# Patient Record
Sex: Female | Born: 1935 | Race: Black or African American | Hispanic: No | Smoking: Never smoker
Health system: Southern US, Community
[De-identification: ages and names within clinical notes are randomized; demographics above are authoritative.]

## PROBLEM LIST (undated history)

## (undated) DIAGNOSIS — R109 Unspecified abdominal pain: Secondary | ICD-10-CM

## (undated) DIAGNOSIS — F32A Depression, unspecified: Secondary | ICD-10-CM

## (undated) DIAGNOSIS — H9319 Tinnitus, unspecified ear: Secondary | ICD-10-CM

## (undated) DIAGNOSIS — R630 Anorexia: Secondary | ICD-10-CM

## (undated) DIAGNOSIS — G253 Myoclonus: Secondary | ICD-10-CM

## (undated) DIAGNOSIS — R768 Other specified abnormal immunological findings in serum: Secondary | ICD-10-CM

## (undated) DIAGNOSIS — F329 Major depressive disorder, single episode, unspecified: Secondary | ICD-10-CM

## (undated) DIAGNOSIS — C801 Malignant (primary) neoplasm, unspecified: Secondary | ICD-10-CM

## (undated) DIAGNOSIS — I6529 Occlusion and stenosis of unspecified carotid artery: Secondary | ICD-10-CM

## (undated) DIAGNOSIS — Z9189 Other specified personal risk factors, not elsewhere classified: Secondary | ICD-10-CM

## (undated) DIAGNOSIS — M797 Fibromyalgia: Secondary | ICD-10-CM

## (undated) DIAGNOSIS — R194 Change in bowel habit: Secondary | ICD-10-CM

## (undated) DIAGNOSIS — IMO0001 Reserved for inherently not codable concepts without codable children: Secondary | ICD-10-CM

## (undated) DIAGNOSIS — R55 Syncope and collapse: Secondary | ICD-10-CM

## (undated) DIAGNOSIS — I82401 Acute embolism and thrombosis of unspecified deep veins of right lower extremity: Secondary | ICD-10-CM

## (undated) DIAGNOSIS — N842 Polyp of vagina: Secondary | ICD-10-CM

## (undated) DIAGNOSIS — M1990 Unspecified osteoarthritis, unspecified site: Secondary | ICD-10-CM

## (undated) DIAGNOSIS — L409 Psoriasis, unspecified: Secondary | ICD-10-CM

## (undated) DIAGNOSIS — K5140 Inflammatory polyps of colon without complications: Secondary | ICD-10-CM

## (undated) DIAGNOSIS — K635 Polyp of colon: Secondary | ICD-10-CM

## (undated) DIAGNOSIS — N390 Urinary tract infection, site not specified: Secondary | ICD-10-CM

## (undated) DIAGNOSIS — I519 Heart disease, unspecified: Secondary | ICD-10-CM

## (undated) DIAGNOSIS — R3915 Urgency of urination: Secondary | ICD-10-CM

## (undated) DIAGNOSIS — E785 Hyperlipidemia, unspecified: Secondary | ICD-10-CM

## (undated) DIAGNOSIS — E559 Vitamin D deficiency, unspecified: Secondary | ICD-10-CM

## (undated) DIAGNOSIS — K649 Unspecified hemorrhoids: Secondary | ICD-10-CM

## (undated) DIAGNOSIS — I700 Atherosclerosis of aorta: Secondary | ICD-10-CM

## (undated) DIAGNOSIS — K5909 Other constipation: Secondary | ICD-10-CM

## (undated) HISTORY — PX: ENDOMETRIAL BIOPSY: SHX622

## (undated) HISTORY — DX: Other specified personal risk factors, not elsewhere classified: Z91.89

## (undated) HISTORY — DX: Myoclonus: G25.3

## (undated) HISTORY — DX: Heart disease, unspecified: I51.9

## (undated) HISTORY — DX: Other specified abnormal immunological findings in serum: R76.8

## (undated) HISTORY — DX: Unspecified hemorrhoids: K64.9

## (undated) HISTORY — DX: Tinnitus, unspecified ear: H93.19

## (undated) HISTORY — PX: TUBAL LIGATION: SHX77

## (undated) HISTORY — DX: Hyperlipidemia, unspecified: E78.5

## (undated) HISTORY — DX: Acute embolism and thrombosis of unspecified deep veins of right lower extremity: I82.401

## (undated) HISTORY — DX: Vitamin D deficiency, unspecified: E55.9

## (undated) HISTORY — DX: Occlusion and stenosis of unspecified carotid artery: I65.29

## (undated) HISTORY — PX: ECTOPIC PREGNANCY SURGERY: SHX613

## (undated) HISTORY — PX: BREAST SURGERY: SHX581

## (undated) HISTORY — PX: TONSILLECTOMY: SUR1361

## (undated) HISTORY — DX: Psoriasis, unspecified: L40.9

## (undated) HISTORY — PX: OTHER SURGICAL HISTORY: SHX169

## (undated) HISTORY — DX: Syncope and collapse: R55

## (undated) HISTORY — DX: Urinary tract infection, site not specified: N39.0

## (undated) HISTORY — DX: Polyp of vagina: N84.2

## (undated) HISTORY — DX: Atherosclerosis of aorta: I70.0

## (undated) HISTORY — DX: Other constipation: K59.09

## (undated) HISTORY — DX: Polyp of colon: K63.5

## (undated) HISTORY — DX: Inflammatory polyps of colon without complications: K51.40

---

## 2010-11-26 DIAGNOSIS — I519 Heart disease, unspecified: Secondary | ICD-10-CM

## 2010-11-26 HISTORY — DX: Heart disease, unspecified: I51.9

## 2015-03-21 ENCOUNTER — Encounter: Payer: Self-pay | Admitting: Internal Medicine

## 2015-03-21 ENCOUNTER — Ambulatory Visit (INDEPENDENT_AMBULATORY_CARE_PROVIDER_SITE_OTHER): Payer: Medicare Other | Admitting: Internal Medicine

## 2015-03-21 VITALS — BP 158/90 | HR 64 | Temp 98.1°F | Resp 14 | Ht 66.75 in | Wt 127.5 lb

## 2015-03-21 DIAGNOSIS — F09 Unspecified mental disorder due to known physiological condition: Secondary | ICD-10-CM

## 2015-03-21 DIAGNOSIS — R4189 Other symptoms and signs involving cognitive functions and awareness: Secondary | ICD-10-CM | POA: Diagnosis not present

## 2015-03-21 DIAGNOSIS — I10 Essential (primary) hypertension: Secondary | ICD-10-CM

## 2015-03-21 DIAGNOSIS — I25810 Atherosclerosis of coronary artery bypass graft(s) without angina pectoris: Secondary | ICD-10-CM

## 2015-03-21 DIAGNOSIS — M064 Inflammatory polyarthropathy: Secondary | ICD-10-CM

## 2015-03-21 DIAGNOSIS — M130 Polyarthritis, unspecified: Secondary | ICD-10-CM

## 2015-03-21 DIAGNOSIS — E559 Vitamin D deficiency, unspecified: Secondary | ICD-10-CM | POA: Diagnosis not present

## 2015-03-21 DIAGNOSIS — I25119 Atherosclerotic heart disease of native coronary artery with unspecified angina pectoris: Secondary | ICD-10-CM

## 2015-03-21 DIAGNOSIS — R030 Elevated blood-pressure reading, without diagnosis of hypertension: Secondary | ICD-10-CM

## 2015-03-21 DIAGNOSIS — F0789 Other personality and behavioral disorders due to known physiological condition: Secondary | ICD-10-CM

## 2015-03-21 LAB — LIPID PANEL
CHOL/HDL RATIO: 4
Cholesterol: 236 mg/dL — ABNORMAL HIGH (ref 0–200)
HDL: 66.7 mg/dL (ref >39.00)
LDL Cholesterol: 150 mg/dL — ABNORMAL HIGH (ref 0–99)
NonHDL: 169.3
Triglycerides: 95 mg/dL (ref 0.0–149.0)
VLDL: 19 mg/dL (ref 0.0–40.0)

## 2015-03-21 LAB — CBC WITH DIFFERENTIAL/PLATELET
BASOS PCT: 0.6 % (ref 0.0–3.0)
Basophils Absolute: 0 10*3/uL (ref 0.0–0.1)
Eosinophils Absolute: 0.1 10*3/uL (ref 0.0–0.7)
Eosinophils Relative: 1.4 % (ref 0.0–5.0)
HEMATOCRIT: 40.5 % (ref 36.0–46.0)
Hemoglobin: 13.5 g/dL (ref 12.0–15.0)
Lymphocytes Relative: 19.4 % (ref 12.0–46.0)
Lymphs Abs: 1.4 10*3/uL (ref 0.7–4.0)
MCHC: 33.4 g/dL (ref 30.0–36.0)
MCV: 87.3 fl (ref 78.0–100.0)
Monocytes Absolute: 0.4 10*3/uL (ref 0.1–1.0)
Monocytes Relative: 5.8 % (ref 3.0–12.0)
Neutro Abs: 5.2 10*3/uL (ref 1.4–7.7)
Neutrophils Relative %: 72.8 % (ref 43.0–77.0)
Platelets: 289 10*3/uL (ref 150.0–400.0)
RBC: 4.64 Mil/uL (ref 3.87–5.11)
RDW: 13.7 % (ref 11.5–15.5)
WBC: 7.1 10*3/uL (ref 4.0–10.5)

## 2015-03-21 LAB — COMPREHENSIVE METABOLIC PANEL
ALBUMIN: 4.1 g/dL (ref 3.5–5.2)
ALT: 22 U/L (ref 0–35)
AST: 22 U/L (ref 0–37)
Alkaline Phosphatase: 94 U/L (ref 39–117)
BILIRUBIN TOTAL: 0.9 mg/dL (ref 0.2–1.2)
BUN: 12 mg/dL (ref 6–23)
CO2: 26 mEq/L (ref 19–32)
Calcium: 9.3 mg/dL (ref 8.4–10.5)
Chloride: 105 mEq/L (ref 96–112)
Creatinine, Ser: 0.65 mg/dL (ref 0.40–1.20)
GFR: 110.59 mL/min (ref >60.00)
GLUCOSE: 83 mg/dL (ref 70–99)
POTASSIUM: 4 meq/L (ref 3.5–5.1)
SODIUM: 139 meq/L (ref 135–145)
Total Protein: 6.7 g/dL (ref 6.0–8.3)

## 2015-03-21 LAB — IRON AND TIBC
%SAT: 28 % (ref 20–55)
Iron: 89 ug/dL (ref 42–145)
TIBC: 314 ug/dL (ref 250–470)
UIBC: 225 ug/dL (ref 125–400)

## 2015-03-21 LAB — MICROALBUMIN / CREATININE URINE RATIO
CREATININE, U: 105.9 mg/dL
Microalb Creat Ratio: 0.9 mg/g (ref 0.0–30.0)
Microalb, Ur: 0.9 mg/dL (ref 0.0–1.9)

## 2015-03-21 LAB — C-REACTIVE PROTEIN

## 2015-03-21 LAB — SEDIMENTATION RATE: Sed Rate: 28 mm/hr — ABNORMAL HIGH (ref 0–22)

## 2015-03-21 LAB — VITAMIN B12: Vitamin B-12: 1157 pg/mL — ABNORMAL HIGH (ref 211–911)

## 2015-03-21 LAB — RHEUMATOID FACTOR: Rhuematoid fact SerPl-aCnc: 16 IU/mL — ABNORMAL HIGH (ref <=14)

## 2015-03-21 LAB — VITAMIN D 25 HYDROXY (VIT D DEFICIENCY, FRACTURES): VITD: 24.13 ng/mL — ABNORMAL LOW (ref 30.00–100.00)

## 2015-03-21 LAB — CK: Total CK: 91 U/L (ref 7–177)

## 2015-03-21 LAB — FERRITIN: Ferritin: 35.3 ng/mL (ref 10.0–291.0)

## 2015-03-21 NOTE — Progress Notes (Signed)
Pre-visit discussion using our clinic review tool. No additional management support is needed unless otherwise documented below in the visit note.  

## 2015-03-21 NOTE — Progress Notes (Addendum)
Patient ID: Carmen Humphrey, female   DOB: 1925-08-25, 81 y.o.   MRN: [id number]   Patient Active Problem List   Diagnosis Date Noted  . Blood pressure elevated without history of HTN 03/22/2015  . Polyarthritis of multiple sites 03/22/2015  . Cognitive and neurobehavioral dysfunction 03/22/2015  . Coronary atherosclerosis of native coronary artery 03/22/2015    Subjective:  CC:   Chief Complaint  Patient presents with  . Establish Care    Eyes ill both become blood shot looking feels like blood coming out of eyes.  . Tinnitus    with headache, feels dizzy when this happens.  . Fibromyalgia    HPI:   Carmen Lesteris a 81 y.o. female who presents  Multiple pain and somatic complaints.  She is a new patient,  Referred by her daughter, with whom she has come to live as recently as last week after relocating  from [place].  Her daughter is quite overwhelmed by her presence and notes that her cognitive function appears to have declined since the last time she saw her. Suffers from anxiety and depression.  Not treated,  She is not sleeping well.   Dozes for  two hours at most  .  Awake most of the time during the day.  Reason for move to [place] to be with daughter due to increased difficulty getting to see the doctors. Having trouble coping with details .  1) Last week her eyes were "bloody."   Does not know what the cause is , did not affect her vision .  There was no bloody discharge.  Symptoms have resolved  2) I am im constant pain all the time. From the neck down to my toes.  Every part of my body hurts. I  have fibromyalgia , self diagnosed over 10 years ago, confirmed by my doctors,  Not sure if she is seeing a rheumatologist.  Not taking any prescribed medcatiosn.  Patient is noted to jerk every minute or so as if in great pain.  hand pain worse across the pads of right hand for the past several weeks.     3) Constant Ringing in the ears and dizziness.  No prior workup per patient.   walks without assistance,  Uses a cane as a last resort,  No falls in over a year.  One remote fall resulted in a  Gash on forehead,  Occurred while walking to market.  Was taken to  ER in [place].  Right hand pain is getting worse  4)   Wants to see a cardiologist .  Cannot disclose any cardiac history.  Not on any medications except OTC supplements. Probably CAD with angina. Medically managed. reports that she was given Ranexa but did  Not tolerate it due to  Having " every symptom that was listed".  Has chest pain "not too often".  No history of cardiac cath or CABG ever done   5) Last week had an episode of  itching inside her left ribcage,  "felt like it was on the inside wanting to come out",  No rash   6) possible weight loss.  Patient cannot quantify but complains that "I have lost all this weight,  Just skin and bones"   PMH:  Unknown.  Patient cannot/will not disclose any history.   Hypertension.  Not taking anything   Meds:  Takes Omega CoQ 10, metamucil,  And a probiotic for chronic constipation..  Moves bowels daily i"but it's not  Cleaning me  out".  Last colonoscopy was 4 years ago fpr follow up on polyps.        Past Medical History  Diagnosis Date  . Heart disease 2012  . History of fainting spells of unknown cause   . Inflammatory polyps of colon     . Allergies  Allergen Reactions  . Ciprofloxacin Nausea And Vomiting    Past Surgical History  Procedure Laterality Date  . Breast surgery      History   Social History  . Marital Status: Widowed    Spouse Name: N/A  . Number of Children: N/A  . Years of Education: N/A   Occupational History  . Not on file.   Social History Main Topics  . Smoking status: Never Smoker   . Smokeless tobacco: Never Used  . Alcohol Use: No  . Drug Use: No  . Sexual Activity: Not on file   Other Topics Concern  . Not on file   Social History Narrative    Family History  Problem Relation Age of Onset  . Heart  disease Mother   . Hypertension Mother   . Heart disease Father   . Hypertension Father          Review of Systems:   The rest of the review of systems was negative except those addressed in the HPI.      Objective:  BP 158/90 mmHg  Pulse 64  Temp(Src) 98.1 F (36.7 C) (Oral)  Resp 14  Ht 5' 6.75" (1.695 m)  Wt 127 lb 8 oz (57.834 kg)  BMI 20.13 kg/m2  SpO2 99%  General appearance: alert, cooperative and appears stated age Ears: normal TM's and external ear canals both ears Throat: lips, mucosa, and tongue normal; teeth and gums normal Neck: no adenopathy, no carotid bruit, supple, symmetrical, trachea midline and thyroid not enlarged, symmetric, no tenderness/mass/nodules Back: symmetric, no curvature. ROM normal. No CVA tenderness. Lungs: clear to auscultation bilaterally Heart: regular rate and rhythm, S1, S2 normal, no murmur, click, rub or gallop Abdomen: soft, non-tender; bowel sounds normal; no masses,  no organomegaly Pulses: 2+ and symmetric Skin: Skin color, texture, turgor normal. No rashes or lesions Lymph nodes: Cervical, supraclavicular, and axillary nodes normal.  Assessment and Plan:  Blood pressure elevated without history of HTN Will treat if elevated at next visit,  Awaiting records,  Has unclear history.  Renal funciton,  Lytes, and UA are normal today.  Lab Results  Component Value Date   CREATININE 0.65 03/21/2015   Lab Results  Component Value Date   NA 139 03/21/2015   K 4.0 03/21/2015   CL 105 03/21/2015   CO2 26 03/21/2015   Lab Results  Component Value Date   MICROALBUR 0.9 03/21/2015     Polyarthritis of multiple sites Prior Workup unclear.  Right hand was painful to painful to palpation but no synovitis was present . Autoimmune panel is mostly pending, including HLA B27, iron studies, etc.  Referral to Dr Jefm Bryant for his opinion recommended if trial of NSAID is not tolerated.  Lab Results  Component Value Date    ESRSEDRATE 28* 03/21/2015   Lab Results  Component Value Date   CRP <0.1* 03/21/2015   Lab Results  Component Value Date   RF 16* 03/21/2015     Cognitive and neurobehavioral dysfunction Unclear if she has pseudodementia from  untreated depression, or vascular dementia.  Thyroid, RPR,  and B12 levels are normal.  Will need MRI brain and neurology evaluation  at some point, pending review of recent records.    Lab Results  Component Value Date   TSH 4.030 03/21/2015   Lab Results  Component Value Date   [id number] 3419* 03/21/2015      Coronary atherosclerosis of native coronary artery Presumed given past trial of ranexa per patient,  Not tolerated,  Referral to [facility] requested,  Will obtain labs and prior records from [facility] cardiologist first.  Lab Results  Component Value Date   CHOL 236* 03/21/2015   HDL 66.70 03/21/2015   LDLCALC 150* 03/21/2015   TRIG 95.0 03/21/2015   CHOLHDL 4 03/21/2015     A total of 45 minutes was spent with patient more than half of which was spent in counseling patient on the above mentioned issues , reviewing and explaining recent labs and imaging studies done, and coordination of care. Updated Medication List Outpatient Encounter Prescriptions as of 03/21/2015  Medication Sig  . Bisacodyl (LAXATIVE PO) Take 1 capsule by mouth daily. ADVANCE COLON CARE II  . Coenzyme Q10-Fish Oil-Vit E (CO-Q 10 OMEGA-3 FISH OIL) CAPS Take 2 capsules by mouth daily.  . Probiotic Product (PROBIOTIC & ACIDOPHILUS EX ST) CAPS Take 1 capsule by mouth daily as needed.  . etodolac (LODINE XL) 400 MG 24 hr tablet Take 1 tablet (400 mg total) by mouth daily.     Orders Placed This Encounter  Procedures  . Microalbumin / creatinine urine ratio  . Comprehensive metabolic panel  . CBC with Differential/Platelet  . Lipid panel  . Vit D  25 hydroxy (rtn osteoporosis monitoring)  . Sedimentation rate  . HLA-B27 antigen  . C-reactive protein  . Ferritin  .  Iron and TIBC  . Rheumatoid factor  . CK  . RPR  . T4 AND TSH  . B12    Return in about 4 weeks (around 04/18/2015).

## 2015-03-21 NOTE — Patient Instructions (Signed)
I will make a referral to a cardiologist and a rheumatologist   I have ordered fasting labs   You have refused anything to help you rest.   You have refused the pneumonia vaccine

## 2015-03-22 ENCOUNTER — Encounter: Payer: Self-pay | Admitting: Internal Medicine

## 2015-03-22 DIAGNOSIS — F09 Unspecified mental disorder due to known physiological condition: Secondary | ICD-10-CM

## 2015-03-22 DIAGNOSIS — M19042 Primary osteoarthritis, left hand: Secondary | ICD-10-CM | POA: Insufficient documentation

## 2015-03-22 DIAGNOSIS — I2510 Atherosclerotic heart disease of native coronary artery without angina pectoris: Secondary | ICD-10-CM | POA: Insufficient documentation

## 2015-03-22 DIAGNOSIS — E559 Vitamin D deficiency, unspecified: Secondary | ICD-10-CM | POA: Insufficient documentation

## 2015-03-22 DIAGNOSIS — R030 Elevated blood-pressure reading, without diagnosis of hypertension: Secondary | ICD-10-CM

## 2015-03-22 DIAGNOSIS — F0789 Other personality and behavioral disorders due to known physiological condition: Secondary | ICD-10-CM | POA: Insufficient documentation

## 2015-03-22 DIAGNOSIS — M1990 Unspecified osteoarthritis, unspecified site: Secondary | ICD-10-CM | POA: Insufficient documentation

## 2015-03-22 HISTORY — DX: Unspecified mental disorder due to known physiological condition: F09

## 2015-03-22 HISTORY — DX: Atherosclerotic heart disease of native coronary artery without angina pectoris: I25.10

## 2015-03-22 HISTORY — DX: Elevated blood-pressure reading, without diagnosis of hypertension: R03.0

## 2015-03-22 HISTORY — DX: Primary osteoarthritis, left hand: M19.042

## 2015-03-22 LAB — RPR

## 2015-03-22 LAB — T4 AND TSH
T4 TOTAL: 8.2 ug/dL (ref 4.5–12.0)
TSH: 4.03 u[IU]/mL (ref 0.450–4.500)

## 2015-03-22 MED ORDER — ERGOCALCIFEROL 1.25 MG (50000 UT) PO CAPS: 50000.0000 [IU] | ORAL_CAPSULE | ORAL | Status: DC

## 2015-03-22 MED ORDER — ETODOLAC ER 400 MG PO TB24: 400.0000 mg | ORAL_TABLET | Freq: Every day | ORAL | Status: DC

## 2015-03-22 NOTE — Assessment & Plan Note (Signed)
Presumed given past trial of ranexa per patient,  Not tolerated,  Referral to [facility] requested,  Will obtain labs and prior records from [facility] cardiologist first.  Lab Results  Component Value Date   CHOL 236* 03/21/2015   HDL 66.70 03/21/2015   LDLCALC 150* 03/21/2015   TRIG 95.0 03/21/2015   CHOLHDL 4 03/21/2015

## 2015-03-22 NOTE — Assessment & Plan Note (Addendum)
Unclear if she has pseudodementia from  untreated depression, or vascular dementia.  Thyroid, RPR,  and B12 levels are normal.  Will need MRI brain and neurology evaluation  at some point, pending review of recent records.    Lab Results  Component Value Date   TSH 4.030 03/21/2015   Lab Results  Component Value Date   [id number] 0722* 03/21/2015

## 2015-03-22 NOTE — Addendum Note (Signed)
Addended by: Crecencio Mc on: 03/22/2015 07:49 PM   Modules accepted: Orders

## 2015-03-22 NOTE — Assessment & Plan Note (Signed)
Will treat if elevated at next visit,  Awaiting records,  Has unclear history.  Renal funciton,  Lytes, and UA are normal today.  Lab Results  Component Value Date   CREATININE 0.65 03/21/2015   Lab Results  Component Value Date   NA 139 03/21/2015   K 4.0 03/21/2015   CL 105 03/21/2015   CO2 26 03/21/2015   Lab Results  Component Value Date   MICROALBUR 0.9 03/21/2015

## 2015-03-22 NOTE — Assessment & Plan Note (Addendum)
Prior Workup unclear.  Right hand was painful to painful to palpation but no synovitis was present . Autoimmune panel is mostly pending, including HLA B27, iron studies, etc.  Referral to Dr Jefm Bryant for his opinion recommended if trial of NSAID is not tolerated.  Lab Results  Component Value Date   ESRSEDRATE 28* 03/21/2015   Lab Results  Component Value Date   CRP <0.1* 03/21/2015   Lab Results  Component Value Date   RF 16* 03/21/2015

## 2015-03-23 ENCOUNTER — Telehealth: Payer: Self-pay

## 2015-03-23 ENCOUNTER — Encounter: Payer: Self-pay | Admitting: *Deleted

## 2015-03-23 LAB — HLA-B27 ANTIGEN: DNA Result:: NOT DETECTED

## 2015-03-23 NOTE — Telephone Encounter (Signed)
Please advise 

## 2015-03-23 NOTE — Telephone Encounter (Signed)
The patient is hoping to get a referral to a cardiologist.  She stated she had a cardiologist in [place], but is hoping to establish with one here in town.

## 2015-03-23 NOTE — Telephone Encounter (Signed)
Patient notified and voiced understanding.

## 2015-03-23 NOTE — Telephone Encounter (Signed)
I am waiting for her records for mher prior cardiologist, since she cannot provide any detail of her history

## 2015-03-25 DIAGNOSIS — R079 Chest pain, unspecified: Secondary | ICD-10-CM

## 2015-03-25 DIAGNOSIS — R0602 Shortness of breath: Secondary | ICD-10-CM | POA: Insufficient documentation

## 2015-03-25 HISTORY — DX: Chest pain, unspecified: R07.9

## 2015-04-04 ENCOUNTER — Telehealth: Payer: Self-pay | Admitting: Internal Medicine

## 2015-04-04 DIAGNOSIS — R198 Other specified symptoms and signs involving the digestive system and abdomen: Secondary | ICD-10-CM

## 2015-04-04 NOTE — Telephone Encounter (Signed)
Pt daughter stopped by the office by request of patient to ask for a referral to gastroenterology. Pt is having stomach pain and bowel issues (black stool). Pt told daughter that she has an appt at the end of the month but feels like she can't wait that long. Please advise

## 2015-04-04 NOTE — Telephone Encounter (Signed)
Patient is having constipation with bout of soft stool between constipation, feels like she is impacted with diarrhea coming around the hard stool. Patient tried to get an appointment with GI at [facility] but she was told she needs referral, new patient seen 03/17/15 awaiting records from [place]. Please advise.

## 2015-04-04 NOTE — Telephone Encounter (Signed)
Please advise 

## 2015-04-05 ENCOUNTER — Other Ambulatory Visit: Payer: Self-pay | Admitting: Internal Medicine

## 2015-04-05 MED ORDER — LACTULOSE 20 GM/30ML PO SOLN: ORAL | Status: DC

## 2015-04-05 NOTE — Telephone Encounter (Signed)
Patient  Notified and voiced understanding, patient to pick up IFOB and to pick RX at pharmacy.

## 2015-04-05 NOTE — Telephone Encounter (Signed)
If patient is having black stool, she needs to do a take home stool card to determine is she is bleeding  Please have her pick one up,  I will also call in lactulose for her to take one dose every 4 hours until constipation is relieved.  Gi referral in process

## 2015-04-15 ENCOUNTER — Other Ambulatory Visit (INDEPENDENT_AMBULATORY_CARE_PROVIDER_SITE_OTHER): Payer: Medicare Other

## 2015-04-15 DIAGNOSIS — R198 Other specified symptoms and signs involving the digestive system and abdomen: Secondary | ICD-10-CM

## 2015-04-15 DIAGNOSIS — R194 Change in bowel habit: Secondary | ICD-10-CM | POA: Diagnosis not present

## 2015-04-15 LAB — FECAL OCCULT BLOOD, IMMUNOCHEMICAL: Fecal Occult Bld: NEGATIVE

## 2015-04-18 ENCOUNTER — Encounter: Payer: Self-pay | Admitting: *Deleted

## 2015-04-21 ENCOUNTER — Ambulatory Visit (INDEPENDENT_AMBULATORY_CARE_PROVIDER_SITE_OTHER): Payer: Medicare Other | Admitting: Internal Medicine

## 2015-04-21 ENCOUNTER — Encounter: Payer: Self-pay | Admitting: Internal Medicine

## 2015-04-21 VITALS — BP 138/76 | HR 63 | Temp 98.5°F | Resp 16 | Ht 66.75 in | Wt 126.5 lb

## 2015-04-21 DIAGNOSIS — E559 Vitamin D deficiency, unspecified: Secondary | ICD-10-CM

## 2015-04-21 DIAGNOSIS — F0789 Other personality and behavioral disorders due to known physiological condition: Secondary | ICD-10-CM

## 2015-04-21 DIAGNOSIS — I25810 Atherosclerosis of coronary artery bypass graft(s) without angina pectoris: Secondary | ICD-10-CM | POA: Diagnosis not present

## 2015-04-21 DIAGNOSIS — M064 Inflammatory polyarthropathy: Secondary | ICD-10-CM | POA: Diagnosis not present

## 2015-04-21 DIAGNOSIS — F09 Unspecified mental disorder due to known physiological condition: Secondary | ICD-10-CM

## 2015-04-21 DIAGNOSIS — M130 Polyarthritis, unspecified: Secondary | ICD-10-CM

## 2015-04-21 DIAGNOSIS — R030 Elevated blood-pressure reading, without diagnosis of hypertension: Secondary | ICD-10-CM | POA: Diagnosis not present

## 2015-04-21 DIAGNOSIS — I2510 Atherosclerotic heart disease of native coronary artery without angina pectoris: Secondary | ICD-10-CM

## 2015-04-21 MED ORDER — ERGOCALCIFEROL 1.25 MG (50000 UT) PO CAPS: 50000.0000 [IU] | ORAL_CAPSULE | ORAL | Status: DC

## 2015-04-21 MED ORDER — ETODOLAC ER 400 MG PO TB24: 400.0000 mg | ORAL_TABLET | Freq: Every day | ORAL | Status: DC

## 2015-04-21 NOTE — Progress Notes (Signed)
Pre-visit discussion using our clinic review tool. No additional management support is needed unless otherwise documented below in the visit note.  

## 2015-04-21 NOTE — Progress Notes (Signed)
Subjective:  Patient ID: Carmen Humphrey, female    DOB: 1924/12/01  Age: 81 y.o. MRN: [id number]  CC: The primary encounter diagnosis was Polyarthritis of multiple sites. Diagnoses of Cognitive and neurobehavioral dysfunction, Blood pressure elevated without history of HTN, Atherosclerosis of native coronary artery of native heart without angina pectoris, and Vitamin D deficiency were also pertinent to this visit.  HPI Carmen Humphrey presents for  Follow up on multiple issues raised at first visit.  Patient is suffering from cognitive decline and has not picked up the Vit D or the lactulose that was prescribed after her last visit for complaints of constipation and blood in her stool.   Her IFOBT was negative and GI referral  was requested and is in process.  She states that she is moving bowels a small amount, almost daily,  And that the caliber of her stools changes on a daily basis.  She is using some OTC " colon cleanser" and probiotic daily.[name]   2) Hand pain.  She continues to report pain in the right hand,  Right hand  fingers are stiff ,  Hurts to close fingers into a fist and describes morning puffiness and stiffness of the MCPS and PIPs.  RA and  ESR were slightly abnormal on testing.  The pain is preventing her from wringing out towels and opening  jars. She has not tried the NSAID that I sent to her pharmacy after assessing her renal function      Outpatient Prescriptions Prior to Visit  Medication Sig Dispense Refill  . Bisacodyl (LAXATIVE PO) Take 1 capsule by mouth daily. ADVANCE COLON CARE II    . Coenzyme Q10-Fish Oil-Vit E (CO-Q 10 OMEGA-3 FISH OIL) CAPS Take 2 capsules by mouth daily.    . Lactulose 20 GM/30ML SOLN 30 ml every 4 hours until constipation is relieved 236 mL 0  . Probiotic Product (PROBIOTIC & ACIDOPHILUS EX ST) CAPS Take 1 capsule by mouth daily as needed.    . ergocalciferol (DRISDOL) 50000 UNITS capsule Take 1 capsule (50,000 Units total) by mouth once a week. 4  capsule 0  . etodolac (LODINE XL) 400 MG 24 hr tablet Take 1 tablet (400 mg total) by mouth daily. 30 tablet 1   No facility-administered medications prior to visit.    Review of Systems;  Patient denies headache, fevers, malaise, unintentional weight loss, skin rash, eye pain, sinus congestion and sinus pain, sore throat, dysphagia,  hemoptysis , cough, dyspnea, wheezing, chest pain, palpitations, orthopnea, edema, abdominal pain, nausea, melena, diarrhea, constipation, flank pain, dysuria, hematuria, urinary  Frequency, nocturia, numbness, tingling, seizures,  Focal weakness, Loss of consciousness,  Tremor, insomnia, depression, anxiety, and suicidal ideation.      Objective:  BP 138/76 mmHg  Pulse 63  Temp(Src) 98.5 F (36.9 C) (Oral)  Resp 16  Ht 5' 6.75" (1.695 m)  Wt 126 lb 8 oz (57.38 kg)  BMI 19.97 kg/m2  SpO2 99%  BP Readings from Last 3 Encounters:  04/21/15 138/76  03/21/15 158/90    Wt Readings from Last 3 Encounters:  04/21/15 126 lb 8 oz (57.38 kg)  03/21/15 127 lb 8 oz (57.834 kg)    General appearance: alert, cooperative and appears stated age Ears: normal TM's and external ear canals both ears Throat: lips, mucosa, and tongue normal; teeth and gums normal Neck: no adenopathy, no carotid bruit, supple, symmetrical, trachea midline and thyroid not enlarged, symmetric, no tenderness/mass/nodules Back: symmetric, no curvature. ROM normal. No CVA tenderness.  Lungs: clear to auscultation bilaterally Heart: regular rate and rhythm, S1, S2 normal, no murmur, click, rub or gallop Abdomen: soft, non-tender; bowel sounds normal; no masses,  no organomegaly Pulses: 2+ and symmetric Skin: Skin color, texture, turgor normal. No rashes or lesions Lymph nodes: Cervical, supraclavicular, and axillary nodes normal.  No results found for: HGBA1C  Lab Results  Component Value Date   CREATININE 0.65 03/21/2015    Lab Results  Component Value Date   WBC 7.1 03/21/2015    HGB 13.5 03/21/2015   HCT 40.5 03/21/2015   PLT 289.0 03/21/2015   GLUCOSE 83 03/21/2015   CHOL 236* 03/21/2015   TRIG 95.0 03/21/2015   HDL 66.70 03/21/2015   LDLCALC 150* 03/21/2015   ALT 22 03/21/2015   AST 22 03/21/2015   NA 139 03/21/2015   K 4.0 03/21/2015   CL 105 03/21/2015   CREATININE 0.65 03/21/2015   BUN 12 03/21/2015   CO2 26 03/21/2015   TSH 4.030 03/21/2015   MICROALBUR 0.9 03/21/2015    Patient was never admitted.  Assessment & Plan:   Problem List Items Addressed This Visit    Blood pressure elevated without history of HTN    Her elevation is mild.  No treatment today .       Polyarthritis of multiple sites - Primary    Her main complaint is her hand, Trial of Lodine.       Relevant Medications   etodolac (LODINE XL) 400 MG 24 hr tablet   Cognitive and neurobehavioral dysfunction    I have asked her daughter who was with her today to supervise her medications since she has had some cogntive decline.       Coronary atherosclerosis of native coronary artery    Referred to [facility] for history of CAD presumed by history of Ranexa prescribed by former ardiologist in [place]. And  Ongoing reports of atypical C/P.  Imdur was prescribed axcording to [facility]' note,  ECHO was normal except for mild valvular abnormalities.  Patient does not appear to be  taking the Idur.       Vitamin D deficiency    Low vitamin D diagnosed after last visit and  Drisdol prescribed, but she did not pick up the medication.  Re prescribed today.        A total of 40 minutes was spent with patient more than half of which was spent in counseling patient on the above mentioned issues , reviewing and explaining recent labs and imaging studies done, and coordination of care.  I am having Ms. Lynn maintain her CO-Q 10 Omega-3 Fish Oil, Bisacodyl (LAXATIVE PO), PROBIOTIC & ACIDOPHILUS EX ST, Lactulose, ergocalciferol, and etodolac.  Meds ordered this encounter  Medications  .  ergocalciferol (DRISDOL) 50000 UNITS capsule    Sig: Take 1 capsule (50,000 Units total) by mouth once a week.    Dispense:  4 capsule    Refill:  0  . etodolac (LODINE XL) 400 MG 24 hr tablet    Sig: Take 1 tablet (400 mg total) by mouth daily.    Dispense:  30 tablet    Refill:  1    Medications Discontinued During This Encounter  Medication Reason  . ergocalciferol (DRISDOL) 50000 UNITS capsule Reorder  . etodolac (LODINE XL) 400 MG 24 hr tablet Reorder    Follow-up: Return in about 3 months (around 07/22/2015).   Crecencio Mc, MD

## 2015-04-21 NOTE — Patient Instructions (Addendum)
Please try the Etodolac for your arthritis pain.  You can take the etodolac (arthritis medicine) and the Vitamin D capsule  together either at breakfast or at dinner  You should wait two hours to take your Lactulose so it doesn't interfere with your medication being absorbed  If the Lactulose relieves your constipation,  Then start taking a dose of Mira lax or metamucil EVERY DAY

## 2015-04-24 NOTE — Assessment & Plan Note (Addendum)
Low vitamin D diagnosed after last visit and  Drisdol prescribed, but she did not pick up the medication.  Re prescribed today.

## 2015-04-24 NOTE — Assessment & Plan Note (Signed)
Her main complaint is her hand, Trial of Lodine.

## 2015-04-24 NOTE — Assessment & Plan Note (Signed)
Referred to [facility] for history of CAD presumed by history of Ranexa prescribed by former ardiologist in [place]. And  Ongoing reports of atypical C/P.  Imdur was prescribed axcording to [facility]' note,  ECHO was normal except for mild valvular abnormalities.  Patient does not appear to be  taking the Idur.

## 2015-04-24 NOTE — Assessment & Plan Note (Signed)
I have asked her daughter who was with her today to supervise her medications since she has had some cogntive decline.

## 2015-04-24 NOTE — Assessment & Plan Note (Signed)
Her elevation is mild.  No treatment today .

## 2015-04-27 DIAGNOSIS — K5900 Constipation, unspecified: Secondary | ICD-10-CM | POA: Insufficient documentation

## 2015-04-27 HISTORY — DX: Constipation, unspecified: K59.00

## 2015-07-11 ENCOUNTER — Telehealth: Payer: Self-pay | Admitting: Internal Medicine

## 2015-07-11 NOTE — Telephone Encounter (Signed)
Pt request to get referral for rheumatology (issues with grasping things, knuckle pain). Please advise

## 2015-07-12 NOTE — Telephone Encounter (Signed)
Patient notified and voiced understanding. Patient stated she will call back for an appointment.

## 2015-07-12 NOTE — Telephone Encounter (Signed)
Referral to rheumatologist declined until she follows up with me per last note  at last visit on the medication  I prescribed called Lodine.

## 2015-07-18 ENCOUNTER — Ambulatory Visit (INDEPENDENT_AMBULATORY_CARE_PROVIDER_SITE_OTHER): Payer: Medicare Other | Admitting: Internal Medicine

## 2015-07-18 ENCOUNTER — Encounter: Payer: Self-pay | Admitting: Internal Medicine

## 2015-07-18 VITALS — BP 128/60 | HR 64 | Wt 128.0 lb

## 2015-07-18 DIAGNOSIS — L603 Nail dystrophy: Secondary | ICD-10-CM | POA: Diagnosis not present

## 2015-07-18 DIAGNOSIS — M064 Inflammatory polyarthropathy: Secondary | ICD-10-CM | POA: Diagnosis not present

## 2015-07-18 DIAGNOSIS — M19042 Primary osteoarthritis, left hand: Secondary | ICD-10-CM

## 2015-07-18 DIAGNOSIS — I25810 Atherosclerosis of coronary artery bypass graft(s) without angina pectoris: Secondary | ICD-10-CM

## 2015-07-18 DIAGNOSIS — M205X1 Other deformities of toe(s) (acquired), right foot: Secondary | ICD-10-CM

## 2015-07-18 DIAGNOSIS — M1990 Unspecified osteoarthritis, unspecified site: Secondary | ICD-10-CM

## 2015-07-18 DIAGNOSIS — F0789 Other personality and behavioral disorders due to known physiological condition: Secondary | ICD-10-CM

## 2015-07-18 DIAGNOSIS — F09 Unspecified mental disorder due to known physiological condition: Secondary | ICD-10-CM

## 2015-07-18 DIAGNOSIS — M130 Polyarthritis, unspecified: Secondary | ICD-10-CM

## 2015-07-18 HISTORY — DX: Other deformities of toe(s) (acquired), right foot: M20.5X1

## 2015-07-18 MED ORDER — ETODOLAC ER 400 MG PO TB24: 400.0000 mg | ORAL_TABLET | Freq: Every day | ORAL | Status: DC

## 2015-07-18 NOTE — Progress Notes (Signed)
Pre visit review using our clinic review tool, if applicable. No additional management support is needed unless otherwise documented below in the visit note. 

## 2015-07-18 NOTE — Patient Instructions (Addendum)
You have arthritis in your hand .  You do not need to to see a rheumatologist at this point in time because you have not tried the anti inflammatory I sent to your pharmacy (Etodolac) .    Please start taking the etodolac every day with breakfast.  Your Vitamin D is low.  You need to continue taking 1000 IUS of Vitamin D3 daily  For your vitamin D deficiency   YOU CAN TAKE all of your medications after a meal  (breakfast)  Referral to [facility] is in progress

## 2015-07-18 NOTE — Progress Notes (Signed)
Subjective:  Patient ID: Carmen Humphrey, female    DOB: 10-10-1925  Age: 81 y.o. MRN: [id number]  CC: The primary encounter diagnosis was Dystrophic nail. Diagnoses of Acquired claw toe of right foot, Polyarthritis of multiple sites, Arthritis of hand, left, and Cognitive and neurobehavioral dysfunction were also pertinent to this visit.  HPI Carmen Humphrey presents for persistent pain involving her left hand, which was diagnosed as OA .  Etodolac was prescribed, but she does not recall being prescribed the medication and did not start the etodolac.  Describes pain with opening jars,  Wringing out washcloths,  And episodes when The ring finger jumping out of joint.  Statesthat the right hand joints are "burn" at home and become hot to the touch. She is requesting to see a rheumatologist.    2) recent episode of developing an "open sore" on right arm that has now resolved.   3) difficutly trimming toe nails.  Claw toe   Outpatient Prescriptions Prior to Visit  Medication Sig Dispense Refill  . Bisacodyl (LAXATIVE PO) Take 1 capsule by mouth daily. ADVANCE COLON CARE II    . Coenzyme Q10-Fish Oil-Vit E (CO-Q 10 OMEGA-3 FISH OIL) CAPS Take 2 capsules by mouth daily.    . Lactulose 20 GM/30ML SOLN 30 ml every 4 hours until constipation is relieved 236 mL 0  . Probiotic Product (PROBIOTIC & ACIDOPHILUS EX ST) CAPS Take 1 capsule by mouth daily as needed.    . ergocalciferol (DRISDOL) 50000 UNITS capsule Take 1 capsule (50,000 Units total) by mouth once a week. 4 capsule 0  . etodolac (LODINE XL) 400 MG 24 hr tablet Take 1 tablet (400 mg total) by mouth daily. 30 tablet 1   No facility-administered medications prior to visit.    Review of Systems;  Patient denies headache, fevers, malaise, unintentional weight loss, skin rash, eye pain, sinus congestion and sinus pain, sore throat, dysphagia,  hemoptysis , cough, dyspnea, wheezing, chest pain, palpitations, orthopnea, edema, abdominal pain,  nausea, melena, diarrhea, constipation, flank pain, dysuria, hematuria, urinary  Frequency, nocturia, numbness, tingling, seizures,  Focal weakness, Loss of consciousness,  Tremor, insomnia, depression, anxiety, and suicidal ideation.      Objective:  BP 128/60 mmHg  Pulse 64  Wt 128 lb (58.06 kg)  SpO2 98%  BP Readings from Last 3 Encounters:  07/18/15 128/60  04/21/15 138/76  03/21/15 158/90    Wt Readings from Last 3 Encounters:  07/18/15 128 lb (58.06 kg)  04/21/15 126 lb 8 oz (57.38 kg)  03/21/15 127 lb 8 oz (57.834 kg)    General appearance: alert, cooperative and appears stated age Ears: normal TM's and external ear canals both ears Throat: lips, mucosa, and tongue normal; teeth and gums normal Neck: no adenopathy, no carotid bruit, supple, symmetrical, trachea midline and thyroid not enlarged, symmetric, no tenderness/mass/nodules Back: symmetric, no curvature. ROM normal. No CVA tenderness. Lungs: clear to auscultation bilaterally Heart: regular rate and rhythm, S1, S2 normal, no murmur, click, rub or gallop Abdomen: soft, non-tender; bowel sounds normal; no masses,  no organomegaly Pulses: 2+ and symmetric Skin: Skin color, texture, turgor normal. No rashes or lesions MSK: no synovitis or ligament laxity.  Lymph nodes: Cervical, supraclavicular, and axillary nodes normal.  No results found for: HGBA1C  Lab Results  Component Value Date   CREATININE 0.65 03/21/2015    Lab Results  Component Value Date   WBC 7.1 03/21/2015   HGB 13.5 03/21/2015   HCT 40.5 03/21/2015  PLT 289.0 03/21/2015   GLUCOSE 83 03/21/2015   CHOL 236* 03/21/2015   TRIG 95.0 03/21/2015   HDL 66.70 03/21/2015   LDLCALC 150* 03/21/2015   ALT 22 03/21/2015   AST 22 03/21/2015   NA 139 03/21/2015   K 4.0 03/21/2015   CL 105 03/21/2015   CREATININE 0.65 03/21/2015   BUN 12 03/21/2015   CO2 26 03/21/2015   TSH 4.030 03/21/2015   MICROALBUR 0.9 03/21/2015    Patient was never  admitted.  Assessment & Plan:   Problem List Items Addressed This Visit      Unprioritized   Arthritis of hand, left    Screening labs for autoimmune forms of arthritis were negative.  Advised to begin etodolac      Relevant Medications   etodolac (LODINE XL) 400 MG 24 hr tablet   Cognitive and neurobehavioral dysfunction    Advised daughter to obtain a pill box for patient and fill it once a week for her      Dystrophic nail - Primary    Referral to Podiatry for management of dystrophic nails        Relevant Orders   Ambulatory referral to Podiatry   Acquired claw toe of right foot    Podiatry referral in process,  Patient advised to wear a shoe with a  wider toe box.       Relevant Orders   Ambulatory referral to Podiatry     A total of 25 minutes of face to face time was spent with patient more than half of which was spent in counselling and coordination of care   I have discontinued Ms. Mayo's ergocalciferol. I am also having her maintain her CO-Q 10 Omega-3 Fish Oil, Bisacodyl (LAXATIVE PO), PROBIOTIC & ACIDOPHILUS EX ST, Lactulose, and etodolac.  Meds ordered this encounter  Medications  . etodolac (LODINE XL) 400 MG 24 hr tablet    Sig: Take 1 tablet (400 mg total) by mouth daily.    Dispense:  30 tablet    Refill:  5    Medications Discontinued During This Encounter  Medication Reason  . ergocalciferol (DRISDOL) 50000 UNITS capsule   . etodolac (LODINE XL) 400 MG 24 hr tablet Reorder    Follow-up: No Follow-up on file.   Crecencio Mc, MD

## 2015-07-19 NOTE — Assessment & Plan Note (Signed)
Screening labs for autoimmune forms of arthritis were negative.  Advised to begin etodolac

## 2015-07-19 NOTE — Assessment & Plan Note (Signed)
Referral to Podiatry for management of dystrophic nails

## 2015-07-19 NOTE — Assessment & Plan Note (Signed)
Podiatry referral in process,  Patient advised to wear a shoe with a  wider toe box.

## 2015-07-19 NOTE — Assessment & Plan Note (Signed)
Advised daughter to obtain a pill box for patient and fill it once a week for her

## 2015-08-09 ENCOUNTER — Ambulatory Visit (INDEPENDENT_AMBULATORY_CARE_PROVIDER_SITE_OTHER): Payer: Medicare Other | Admitting: Podiatry

## 2015-08-09 ENCOUNTER — Encounter: Payer: Self-pay | Admitting: Podiatry

## 2015-08-09 ENCOUNTER — Ambulatory Visit: Payer: Medicare Other

## 2015-08-09 VITALS — BP 142/75 | HR 70 | Resp 18

## 2015-08-09 DIAGNOSIS — M79676 Pain in unspecified toe(s): Secondary | ICD-10-CM

## 2015-08-09 DIAGNOSIS — B351 Tinea unguium: Secondary | ICD-10-CM | POA: Diagnosis not present

## 2015-08-09 DIAGNOSIS — R52 Pain, unspecified: Secondary | ICD-10-CM

## 2015-08-09 DIAGNOSIS — M2040 Other hammer toe(s) (acquired), unspecified foot: Secondary | ICD-10-CM

## 2015-08-09 DIAGNOSIS — I25810 Atherosclerosis of coronary artery bypass graft(s) without angina pectoris: Secondary | ICD-10-CM

## 2015-08-09 NOTE — Progress Notes (Signed)
   Subjective:    Patient ID: Carmen Humphrey, female    DOB: 31-Mar-1925, 81 y.o.   MRN: [id number]  HPI  81 year old female presents the office today for concerns of thick, painful, elongated toenails which she has healed herself. She denies any drainage or redness from the toenails. She also states that she has hammertoes. She does get some irritation of the rib and shoes. She said no prior treatment. Denies any swelling or redness. No other complaint time.   Review of Systems  All other systems reviewed and are negative.      Objective:   Physical Exam AAO 3, NAD DP/PT pulses palpable, CRT less than 3 seconds Protective sensation decreased Simms Weinstein monofilament, vibratory sensation intact, Achilles tendon reflex intact. Nails are hypertrophic, dystrophic, brittle, discolored, elongated 10. There is tenderness to palpation Nails 1-5 bilaterally. No surrounding erythema or drainage. No open lesions or pre-ulcerative lesions. Hammertoe contractures which are semirigid bilateral lesser digits. There is no overlying skin changes. There is no tenderness palpation overlying that this time. MMT 5/5, ROM WNL No pain with calf compression, swelling, warmth, erythema.    Assessment & Plan:  81 year old female with symptomatic onychomycosis, hammertoes. -Treatment options discussed including all alternatives, risks, and complications -Nail sharply debrided 10 without complication/bleeding. -Offloading pads were dispensed for the toes. Continue with supportive shoe gear and tall toe box. Monitor for skin breakdown. -Follow-up in 3 months or sooner if any problems arise. In the meantime, encouraged to call the office with any questions, concerns, change in symptoms.   Celesta Gentile, DPM

## 2015-08-12 DIAGNOSIS — M6080 Other myositis, unspecified site: Secondary | ICD-10-CM | POA: Insufficient documentation

## 2015-08-12 DIAGNOSIS — Z8719 Personal history of other diseases of the digestive system: Secondary | ICD-10-CM

## 2015-08-12 DIAGNOSIS — IMO0001 Reserved for inherently not codable concepts without codable children: Secondary | ICD-10-CM | POA: Insufficient documentation

## 2015-08-12 DIAGNOSIS — L409 Psoriasis, unspecified: Secondary | ICD-10-CM | POA: Insufficient documentation

## 2015-08-12 DIAGNOSIS — R002 Palpitations: Secondary | ICD-10-CM | POA: Insufficient documentation

## 2015-08-12 DIAGNOSIS — M1990 Unspecified osteoarthritis, unspecified site: Secondary | ICD-10-CM

## 2015-08-12 DIAGNOSIS — Z789 Other specified health status: Secondary | ICD-10-CM | POA: Insufficient documentation

## 2015-08-12 DIAGNOSIS — M6089 Other myositis, multiple sites: Secondary | ICD-10-CM | POA: Insufficient documentation

## 2015-08-12 DIAGNOSIS — M2040 Other hammer toe(s) (acquired), unspecified foot: Secondary | ICD-10-CM | POA: Insufficient documentation

## 2015-08-12 HISTORY — DX: Unspecified osteoarthritis, unspecified site: M19.90

## 2015-08-12 HISTORY — DX: Other specified health status: Z78.9

## 2015-08-12 HISTORY — DX: Personal history of other diseases of the digestive system: Z87.19

## 2015-08-12 HISTORY — DX: Palpitations: R00.2

## 2015-08-15 DIAGNOSIS — E785 Hyperlipidemia, unspecified: Secondary | ICD-10-CM | POA: Insufficient documentation

## 2015-08-15 DIAGNOSIS — E7849 Other hyperlipidemia: Secondary | ICD-10-CM

## 2015-08-15 HISTORY — DX: Other hyperlipidemia: E78.49

## 2015-09-08 ENCOUNTER — Other Ambulatory Visit: Payer: Self-pay | Admitting: Nurse Practitioner

## 2015-09-08 DIAGNOSIS — R1084 Generalized abdominal pain: Secondary | ICD-10-CM

## 2015-09-14 ENCOUNTER — Ambulatory Visit: Payer: Medicare Other

## 2015-09-16 ENCOUNTER — Ambulatory Visit
Admission: RE | Admit: 2015-09-16 | Discharge: 2015-09-16 | Disposition: A | Discharge status: Home / Self Care | Payer: Medicare Other | Source: Ambulatory Visit | Attending: Nurse Practitioner | Admitting: Nurse Practitioner

## 2015-09-16 DIAGNOSIS — R1084 Generalized abdominal pain: Secondary | ICD-10-CM | POA: Diagnosis not present

## 2015-09-16 DIAGNOSIS — K5900 Constipation, unspecified: Secondary | ICD-10-CM | POA: Insufficient documentation

## 2015-09-16 DIAGNOSIS — I7090 Unspecified atherosclerosis: Secondary | ICD-10-CM | POA: Diagnosis not present

## 2015-09-16 DIAGNOSIS — R938 Abnormal findings on diagnostic imaging of other specified body structures: Secondary | ICD-10-CM | POA: Diagnosis not present

## 2015-09-16 DIAGNOSIS — I2510 Atherosclerotic heart disease of native coronary artery without angina pectoris: Secondary | ICD-10-CM | POA: Insufficient documentation

## 2015-09-16 HISTORY — DX: Malignant (primary) neoplasm, unspecified: C80.1

## 2015-09-16 MED ORDER — IOHEXOL 300 MG/ML  SOLN
80.0000 mL | Freq: Once | INTRAMUSCULAR | Status: AC | PRN
  Administered 2015-09-16: 80 mL via INTRAVENOUS

## 2015-10-28 ENCOUNTER — Ambulatory Visit: Payer: Medicare Other | Attending: Rheumatology | Admitting: Occupational Therapy

## 2015-10-28 DIAGNOSIS — M25641 Stiffness of right hand, not elsewhere classified: Secondary | ICD-10-CM | POA: Diagnosis present

## 2015-10-28 DIAGNOSIS — M6530 Trigger finger, unspecified finger: Secondary | ICD-10-CM | POA: Insufficient documentation

## 2015-10-28 DIAGNOSIS — M79641 Pain in right hand: Secondary | ICD-10-CM | POA: Insufficient documentation

## 2015-10-28 NOTE — Patient Instructions (Signed)
Contrast baths 3 x day  Follow 2 of them with digits extention - AROM  MC flexion  Intrinsic fist - with ring splint on for blocking of 4th MC   Wear MC block splint during functional tasks on 4th to avoid triggering  And at night time

## 2015-10-28 NOTE — Therapy (Signed)
[facility] [address] [place], [address] Phone: [phone]   Fax:  [phone]  Occupational Therapy Treatment  Patient Details  Name: Carmen Humphrey MRN: [id number] Date of Birth: 1925/03/22 Referring Provider: Jefm Bryant  Encounter Date: 10/28/2015      OT End of Session - 10/28/15 1412    Visit Number 1   Number of Visits 5   Date for OT Re-Evaluation 12/02/15   OT Start Time [id number]   OT Stop Time 1250   OT Time Calculation (min) 65 min   Activity Tolerance Patient tolerated treatment well;Other (comment)  Fear of pain - limiting ROM    Behavior During Therapy WFL for tasks assessed/performed      Past Medical History  Diagnosis Date  . Heart disease 2012  . History of fainting spells of unknown cause   . Inflammatory polyps of colon ([place])   . Cancer [facility])     Past Surgical History  Procedure Laterality Date  . Breast surgery      There were no vitals filed for this visit.  Visit Diagnosis:  Trigger finger, acquired - Plan: Ot plan of care cert/re-cert  Pain of right hand - Plan: Ot plan of care cert/re-cert  Stiffness of finger joint of right hand - Plan: Ot plan of care cert/re-cert      Subjective Assessment - 10/28/15 1400    Subjective  My finger catches - its been going on for 8-89months - so I use only the first 3 fingers - not the ring and pinkie    Patient is accompained by: Family member   Patient Stated Goals Want to get the pain better , and my finger not to catch , that  I can do what  I want to do - want to stay independent    Currently in Pain? Yes   Pain Score 1    Pain Location Hand   Pain Orientation Right   Pain Descriptors / Indicators Burning   Pain Onset More than a month ago            [facility] OT Assessment - 10/28/15 0001    Assessment   Diagnosis R 4th digit trigger finger    Referring Provider [facility]   Onset Date 01/25/15   Assessment Pt had  trigger finger for few months - and not using that finger or pinkie - seen Dr Jefm Bryant but did not want the shot - refer to hand therapy   Balance Screen   Has the patient fallen in the past 6 months No   Has the patient had a decrease in activity level because of a fear of falling?  No   Is the patient reluctant to leave their home because of a fear of falling?  No   Home  Environment   Additional Comments Live with her daugher and family   Lives With Family   Prior Function   Level of Independence Independent   Leisure Pt is retired, just moved down  from [place] to [place] to live with daughter 8-9 months ago - do things around the house to help, reading, crossword puzzles - and watch tv   Right Hand AROM   R Index  MCP 0-90 65 Degrees   R Index PIP 0-100 80 Degrees   R Long  MCP 0-90 65 Degrees   R Long PIP 0-100 75 Degrees  -35   R Ring  MCP 0-90 65 Degrees  R Ring PIP 0-100 75 Degrees  -36   R Little  MCP 0-90 65 Degrees   R Little PIP 0-100 80 Degrees                  OT Treatments/Exercises (OP) - 2015/11/14 0001    RUE Contrast Bath   Time 11 minutes   Comments pt and daughter ed on using at home to decrease pain and increaes ROM prior to HEP    Splinting   Splinting MC block splint fabricated for 4th R digits to decrease catching and pain                 OT Education - 11/14/2015 1411    Education provided Yes   Education Details see pt instruction -    Person(s) Educated Patient;Child(ren)   Methods Explanation;Demonstration;Tactile cues;Verbal cues;Handout   Comprehension Verbal cues required;Returned demonstration;Verbalized understanding          OT Short Term Goals - 11/14/15 1418    OT SHORT TERM GOAL #1   Title Pain on PRWHE improve by at least 20 points for pt to use 4th and 5th digit in loose grip    Baseline PRWHE for pain at eval 35/50; pt fearfull to make fist- or bend/extend 4th / 5th    Time 3   Period Weeks   Status New   OT SHORT TERM  GOAL #2   Title Pt to be ind in HEP for contrast, splinting and ROM to show increase flexion to touch palm    Baseline MC's about  65 , PIP's 75-80 degrees    Time 3   Period Weeks   Status New           OT Long Term Goals - November 14, 2015 1420    OT LONG TERM GOAL #1   Title Pt to show increase functinal use of R hand using all digits to improve function on PRWHE by at least 15 points    Baseline 25.5/50 for function on PRWHE   Time 6   Period Weeks   Status New               Plan - November 14, 2015 1414    Clinical Impression Statement Pt present at eval with R 4th digit trigger finger - tenderness over nodules 4th worse than 3rd - pt show decrease AROM in all digits with 4th and 5th the worse - because of fear that it will trigger/"catch"- pt using most of time first 3 digits - strength was not assess - pt lmiited by pain , decrease motion - limiting her functional use of  R domnant hand in ADL's and IADL's    Pt will benefit from skilled therapeutic intervention in order to improve on the following deficits (Retired) Impaired UE functional use;Pain;Impaired flexibility;Decreased range of motion;Decreased strength   Rehab Potential Good   OT Frequency 1x / week   OT Duration 6 weeks   OT Treatment/Interventions Self-care/ADL training;Contrast Bath;Iontophoresis;Ultrasound;Therapeutic exercise;Patient/family education;Splinting;Manual Therapy;Passive range of motion   Plan Assess pain , ROM - if less fearfull to move - and use of MC block splint for 4th    OT Home Exercise Plan see pt instruction   Consulted and Agree with Plan of Care Patient          G-Codes - 14-Nov-2015 1422    Functional Assessment Tool Used PRWHE , ROM , pain , clinical judgement    Functional Limitation Self care   Self Care Current Status (  [id number]) At least 40 percent but less than 60 percent impaired, limited or restricted   Self Care Goal Status [id number]) At least 20 percent but less than 40 percent impaired,  limited or restricted      Problem List Patient Active Problem List   Diagnosis Date Noted  . Dystrophic nail 07/18/2015  . Acquired claw toe of right foot 07/18/2015  . Blood pressure elevated without history of HTN 03/22/2015  . Arthritis of hand, left 03/22/2015  . Cognitive and neurobehavioral dysfunction 03/22/2015  . Coronary atherosclerosis of native coronary artery 03/22/2015  . Vitamin D deficiency 03/22/2015    Rosalyn Gess OTR/L,CLT 10/28/2015, 2:29 PM  [facility] [address], [place], [address] Phone: [phone]   Fax:  [phone]  Name: Carmen Humphrey MRN: [id number] Date of Birth: [birth year]

## 2015-11-04 ENCOUNTER — Ambulatory Visit: Payer: Medicare Other | Attending: Rheumatology | Admitting: Occupational Therapy

## 2015-11-04 DIAGNOSIS — M79641 Pain in right hand: Secondary | ICD-10-CM | POA: Diagnosis present

## 2015-11-04 DIAGNOSIS — M6530 Trigger finger, unspecified finger: Secondary | ICD-10-CM | POA: Diagnosis present

## 2015-11-04 DIAGNOSIS — M25641 Stiffness of right hand, not elsewhere classified: Secondary | ICD-10-CM | POA: Insufficient documentation

## 2015-11-04 NOTE — Therapy (Signed)
[facility] [address] [place], [address] Phone: [phone]   Fax:  [phone]  Occupational Therapy Treatment  Patient Details  Name: Carmen Humphrey MRN: [id number] Date of Birth: 22-Oct-1925 Referring Provider: Jefm Bryant  Encounter Date: 11/04/2015      OT End of Session - 11/04/15 1220    Visit Number 2   Number of Visits 5   Date for OT Re-Evaluation 12/02/15   OT Start Time 0813   OT Stop Time 0850   OT Time Calculation (min) 37 min   Activity Tolerance Patient tolerated treatment well;Other (comment)   Behavior During Therapy WFL for tasks assessed/performed      Past Medical History  Diagnosis Date  . Heart disease 2012  . History of fainting spells of unknown cause   . Inflammatory polyps of colon ([place])   . Cancer [facility])     Past Surgical History  Procedure Laterality Date  . Breast surgery      There were no vitals filed for this visit.  Visit Diagnosis:  Trigger finger, acquired  Pain of right hand  Stiffness of finger joint of right hand      Subjective Assessment - 11/04/15 1210    Subjective  Pt reports continued stiffness and "catching" mostly ring finger. Pt also reports difficulty wearing MCP block splint R RF.   Patient is accompained by: Family member   Pertinent History Daughter   Patient Stated Goals Want to get the pain better , and my finger not to catch , that  I can do what  I want to do - want to stay independent    Currently in Pain? Yes   Pain Score 2    Pain Location Hand   Pain Orientation Right   Pain Descriptors / Indicators Sore;Discomfort   Pain Type Chronic pain   Pain Onset More than a month ago   Pain Frequency Constant                      OT Treatments/Exercises (OP) - 11/04/15 0001    Exercises   Exercises Hand   Hand Exercises   Other Hand Exercises Reviewed & Performed HEP including Active extension ex's each digit;  composite extension; blocked flexion ex's with splint on R RF(hook/claw)   Pt required min vc's and tc's for positioning/follow through   RUE Contrast Bath   Time --  Reviewed performance with pt and daughter. "It helps"   Splinting   Splinting MCP block splint adjusted for better fit to 4th R digits to decrease catching and pain   Pt educated in splint use, care and precautions   Manual Therapy   Manual Therapy Edema management;Joint mobilization;Soft tissue mobilization  Right RF - pt tolerates gentle stretch/massage/joint mobs                OT Education - 11/04/15 1218    Education Details Hold exercises for count of 3-5 seconds; wear splint during hook fisiting   Person(s) Educated Patient;Child(ren)   Methods Explanation;Demonstration;Tactile cues;Verbal cues   Comprehension Verbalized understanding;Returned demonstration;Verbal cues required;Tactile cues required          OT Short Term Goals - 10/28/15 1418    OT SHORT TERM GOAL #1   Title Pain on PRWHE improve by at least 20 points for pt to use 4th and 5th digit in loose grip    Baseline PRWHE for pain at eval 35/50; pt fearfull  to make fist- or bend/extend 4th / 5th    Time 3   Period Weeks   Status New   OT SHORT TERM GOAL #2   Title Pt to be ind in HEP for contrast, splinting and ROM to show increase flexion to touch palm    Baseline MC's about  65 , PIP's 75-80 degrees    Time 3   Period Weeks   Status New           OT Long Term Goals - 10/28/15 1420    OT LONG TERM GOAL #1   Title Pt to show increase functinal use of R hand using all digits to improve function on PRWHE by at least 15 points    Baseline 25.5/50 for function on PRWHE   Time 6   Period Weeks   Status New               Plan - 11/04/15 1220    Clinical Impression Statement Pt cont to c/o pain right hand/fingers. Splint adjustments today should provide increased comfort and pt ability to wear during hook/claw fisting ex's.  Pt benefits from verbal and tactile cues for proper positioning and follow through of home program. Contrast bath appears to assist with decreased soreness/pain.   Pt will benefit from skilled therapeutic intervention in order to improve on the following deficits (Retired) Impaired UE functional use;Pain;Impaired flexibility;Decreased range of motion;Decreased strength   Rehab Potential Good   OT Frequency 1x / week   OT Duration 6 weeks   OT Treatment/Interventions Self-care/ADL training;Contrast Bath;Iontophoresis;Ultrasound;Therapeutic exercise;Patient/family education;Splinting;Manual Therapy;Passive range of motion   Plan Assess pain, AROM and MCP blocking splint for hook fisting.   OT Home Exercise Plan Pt has copy of written instructions   Consulted and Agree with Plan of Care Patient;Family member/caregiver   Family Member Consulted daughter        Problem List Patient Active Problem List   Diagnosis Date Noted  . Dystrophic nail 07/18/2015  . Acquired claw toe of right foot 07/18/2015  . Blood pressure elevated without history of HTN 03/22/2015  . Arthritis of hand, left 03/22/2015  . Cognitive and neurobehavioral dysfunction 03/22/2015  . Coronary atherosclerosis of native coronary artery 03/22/2015  . Vitamin D deficiency 03/22/2015    Percell Miller Beth Dixon, OTR/L 11/04/2015, 12:25 PM  [facility] [address] [place], [address] Phone: [phone]   Fax:  [phone]  Name: Carmen Humphrey MRN: [id number] Date of Birth: 1925-06-21

## 2015-11-08 ENCOUNTER — Ambulatory Visit: Payer: Medicare Other | Admitting: Sports Medicine

## 2015-11-08 ENCOUNTER — Encounter: Payer: Self-pay | Admitting: Sports Medicine

## 2015-11-08 ENCOUNTER — Ambulatory Visit: Payer: Medicare Other

## 2015-11-08 ENCOUNTER — Ambulatory Visit (INDEPENDENT_AMBULATORY_CARE_PROVIDER_SITE_OTHER): Payer: Medicare Other | Admitting: Sports Medicine

## 2015-11-08 DIAGNOSIS — B351 Tinea unguium: Secondary | ICD-10-CM

## 2015-11-08 DIAGNOSIS — M79676 Pain in unspecified toe(s): Secondary | ICD-10-CM | POA: Diagnosis not present

## 2015-11-08 DIAGNOSIS — M2040 Other hammer toe(s) (acquired), unspecified foot: Secondary | ICD-10-CM

## 2015-11-08 DIAGNOSIS — M21619 Bunion of unspecified foot: Secondary | ICD-10-CM | POA: Diagnosis not present

## 2015-11-08 NOTE — Progress Notes (Signed)
Patient ID: Carmen Humphrey, female   DOB: May 04, 1925, 81 y.o.   MRN: [id number] Subjective: Carmen Humphrey is a 81 y.o. female patient seen today in office with complaint of painful thickened and elongated toenails; unable to trim. Patient denies history of Diabetes, Neuropathy, or Vascular disease. Admits to history of arthritis in hand, trigger finger, and fibromyalgia. Reports memory defects that is causing her to be less independent; currently lives with her daughter. Patient has no other pedal complaints at this time.   Uses Sal Acid Cream for Psorasis on feet; Currently there is no acute skin involvement.   Patient Active Problem List   Diagnosis Date Noted  . Dystrophic nail 07/18/2015  . Acquired claw toe of right foot 07/18/2015  . Blood pressure elevated without history of HTN 03/22/2015  . Arthritis of hand, left 03/22/2015  . Cognitive and neurobehavioral dysfunction 03/22/2015  . Coronary atherosclerosis of native coronary artery 03/22/2015  . Vitamin D deficiency 03/22/2015   Current Outpatient Prescriptions on File Prior to Visit  Medication Sig Dispense Refill  . Bisacodyl (LAXATIVE PO) Take 1 capsule by mouth daily. ADVANCE COLON CARE II    . Coenzyme Q10-Fish Oil-Vit E (CO-Q 10 OMEGA-3 FISH OIL) CAPS Take 2 capsules by mouth daily.    [name] etodolac (LODINE XL) 400 MG 24 hr tablet Take 1 tablet (400 mg total) by mouth daily. 30 tablet 5  . Lactulose 20 GM/30ML SOLN 30 ml every 4 hours until constipation is relieved 236 mL 0  . Lactulose 20 GM/30ML SOLN 30 ml every 4 hours until constipation is relieved    . Probiotic Product (PROBIOTIC & ACIDOPHILUS EX ST) CAPS Take 1 capsule by mouth daily as needed.     No current facility-administered medications on file prior to visit.   Allergies  Allergen Reactions  . Ciprofloxacin Nausea And Vomiting     Objective: Physical Exam  General: Well developed, nourished, no acute distress, awake, alert and oriented x 3  Vascular:  Dorsalis pedis artery 1/4 bilateral, Posterior tibial artery 1/4 bilateral, skin temperature warm to warm proximal to distal bilateral lower extremities, mild varicosities, pedal hair present bilateral.  Neurological: Gross sensation present via light touch bilateral.   Dermatological: Skin is warm, dry, and supple bilateral, Nails 1-10 are tender, long, thick, and discolored with mild subungal debris, no webspace macerations present bilateral, no open lesions present bilateral, no callus/corns/hyperkeratotic tissue present bilateral. No signs of infection bilateral.  Musculoskeletal: Bunion and hammertoe deformities noted bilateral. Muscular strength within normal limits without pain or limitation on range of motion. No pain with calf compression bilateral.  Assessment and Plan:  Problem List Items Addressed This Visit    None    Visit Diagnoses    Dermatophytosis of nail    -  Primary    Pain of toe, unspecified laterality        Hammertoe, unspecified laterality        Bunion          -Examined patient.  -Discussed treatment options. -Mechanically debrided and reduced mycotic nails with sterile nail nipper and dremel nail file without incident. -Continue with crest pad and good supportive shoes daily for foot type.  -Patient to return in 3 months for follow up evaluation or sooner if symptoms worsen.  [name], DPM

## 2015-11-22 ENCOUNTER — Encounter: Payer: Self-pay | Admitting: Occupational Therapy

## 2015-11-22 ENCOUNTER — Ambulatory Visit: Payer: Medicare Other | Admitting: Occupational Therapy

## 2015-11-22 DIAGNOSIS — M25641 Stiffness of right hand, not elsewhere classified: Secondary | ICD-10-CM

## 2015-11-22 DIAGNOSIS — M79641 Pain in right hand: Secondary | ICD-10-CM

## 2015-11-22 DIAGNOSIS — M6530 Trigger finger, unspecified finger: Secondary | ICD-10-CM | POA: Diagnosis not present

## 2015-11-22 NOTE — Therapy (Signed)
[facility] [address] [place], [address] Phone: [phone]   Fax:  [phone]  Occupational Therapy Treatment  Patient Details  Name: Carmen Humphrey MRN: [id number] Date of Birth: [birth year] Referring Provider: Jefm Bryant  Encounter Date: 11/22/2015      OT End of Session - 11/22/15 0959    Visit Number 3   Number of Visits 5   Date for OT Re-Evaluation 12/02/15   OT Start Time 0850   OT Stop Time 0935   OT Time Calculation (min) 45 min   Equipment Utilized During Treatment silipose tube, silicone gel sheet 1"x1", velfoam straps, padding, velcro hook   Activity Tolerance Patient limited by pain;Treatment limited secondary to agitation  patient is frustrated with pain in the right hand. Cannot do the splint : painful. Does not want to have a shot in her hand, steroids worry her, does not want medication for this issue: as there are multiple health concerns at present.    Behavior During Therapy Anxious  Patient has difficulty performing exercises, upset and frustrated with lack of progress. Patient repeats her concern for finger going out of joint. She pulls away, jumps in her seat. She does not pair the triggering with the joint pain at her PIP      Past Medical History  Diagnosis Date  . Heart disease 2012  . History of fainting spells of unknown cause   . Inflammatory polyps of colon ([place])   . Cancer [facility])     Past Surgical History  Procedure Laterality Date  . Breast surgery      There were no vitals filed for this visit.  Visit Diagnosis:  Trigger finger, acquired  Pain of right hand  Stiffness of finger joint of right hand      Subjective Assessment - 11/22/15 0946    Subjective  Pt reports continued stiffness and finger going out of joint. Cant do anything with this hand, it is swollen and the "PIP" joint goes out. Pain at "MCP volar" with splint on. I cannot wear the splint it  limits me during the day, and i wake up in pain at right ring finger. Pain is 10/10 when it goes out of joint.    Patient is accompained by: Family member   Pertinent History Daughter   Limitations Unable to use right hand to perform daily routines, secondary to right ring finger and little finger not bending.   Repetition Increases Symptoms   Special Tests palpation of right digit 4 volar MCP exquisitely painful.    Patient Stated Goals Want to get the pain better , and my finger not to catch , that  I can do what  I want to do - want to stay independent    Currently in Pain? Other (Comment)  Patient has pain but will not apply a number. Has 10/10 when it goes out of joint. Pain all of the time, not a 10 but constant.   Pain Location Finger (Comment which one)  right ring at mcp and at PIP when it goes out of joint   Pain Orientation Right   Pain Descriptors / Indicators Aching;Constant;Sore;Discomfort;Sharp;Stabbing   Pain Type Chronic pain   Pain Onset More than a month ago   Pain Frequency Constant   Aggravating Factors  using right hand. When the PIP joint goes "out of joint"   Pain Relieving Factors not using the right hand   Effect of Pain on Daily Activities  Cannot use hand to take care of things she wants to get done   Multiple Pain Sites No            [facility] OT Assessment - 11/22/15 0001    Right Hand AROM   R Ring  MCP 0-90 70 Degrees   R Ring PIP 0-100 45 Degrees   R Ring DIP 0-70 20 Degrees     Assessment: pain persists, lack of use persists, ROM decreases at right digit IV.              OT Treatments/Exercises (OP) - 11/22/15 0001    Exercises   Exercises Hand   Hand Exercises   Other Hand Exercises Added gentle grasp around foam block and 2" diameter items for hep. Required gentle touch cues to have patient flex digits of right hand. Reviewed & Performed HEP including Active extension ex's each digit; composite extension; blocked flexion ex's with splint  on R RF(hook/claw)   Pt required min vc's and tc's for positioning/follow through   Splinting   Splinting Provided silipose tube for day use: compression and protection of right digits IV to increase comfort and USE.  MCP blocking splint: Adjusted inferior edge of MCP block splint to soften : added gel padding under foam (4th R digit) to decrease catching and pain .   Pt educated in splint use, care and precautions. Wear for ni   Manual Therapy   Manual Therapy Edema management;Joint mobilization;Soft tissue mobilization  Right RF - pt tolerates gentle stretch/massage/joint mobs                OT Education - 11/22/15 0956    Education provided Yes   Education Details Silipose tube to digit IV right for day to increase use of right hand in ADL's. MCP blocking splint for night only.  Provided instruction in cleaning with alcohol wipes, and extra parts so patient can change the padding when it gets soiled. Instructed in washing of straps, and provided additional straps. Patient has concerns of "germs" with the use of the splint.    Person(s) Educated Patient;Child(ren)   Methods Explanation;Demonstration;Tactile cues;Verbal cues   Comprehension Verbalized understanding;Returned demonstration  daughter also instructed in use of silipose tube and splint as well as encourage use of right hand throughout the day          OT Short Term Goals - 10/28/15 1418    OT SHORT TERM GOAL #1   Title Pain on PRWHE improve by at least 20 points for pt to use 4th and 5th digit in loose grip    Baseline PRWHE for pain at eval 35/50; pt fearfull to make fist- or bend/extend 4th / 5th    Time 3   Period Weeks   Status New   OT SHORT TERM GOAL #2   Title Pt to be ind in HEP for contrast, splinting and ROM to show increase flexion to touch palm    Baseline MC's about  65 , PIP's 75-80 degrees    Time 3   Period Weeks   Status New           OT Long Term Goals - 10/28/15 1420    OT LONG TERM  GOAL #1   Title Pt to show increase functinal use of R hand using all digits to improve function on PRWHE by at least 15 points    Baseline 25.5/50 for function on PRWHE   Time 6   Period Weeks   Status  New               Plan - 11/22/15 1006    Clinical Impression Statement patient making little progress with increased use of right hand or decreasing pain at digit IV. She is willing to try use of silipose tube for day: to increase her comfort and use of the right hand for one week.   Pt will benefit from skilled therapeutic intervention in order to improve on the following deficits (Retired) Impaired UE functional use;Pain;Impaired flexibility;Decreased range of motion;Decreased strength   Rehab Potential Fair   OT Frequency 1x / week   OT Duration 6 weeks   OT Treatment/Interventions Self-care/ADL training;Contrast Bath;Iontophoresis;Therapeutic exercise;Patient/family education;Splinting;Manual Therapy;Passive range of motion;Ultrasound  Declines use of steroid (iontophoresis)   Plan Increase use of right hand with use of protection at digit IV. Apply a buddy strap to digit III and IV at next visit if no improvement in grasp.   OT Home Exercise Plan Pt has copy of written instructions. Splint for night use only. silipose tube for day as skin tolerates.      Plan: aimed at pain management, increased functional use within tolerance.   Problem List Patient Active Problem List   Diagnosis Date Noted  . Dystrophic nail 07/18/2015  . Acquired claw toe of right foot 07/18/2015  . Blood pressure elevated without history of HTN 03/22/2015  . Arthritis of hand, left 03/22/2015  . Cognitive and neurobehavioral dysfunction 03/22/2015  . Coronary atherosclerosis of native coronary artery 03/22/2015  . Vitamin D deficiency 03/22/2015    Rosaelena Kemnitz  OTR/L 11/22/2015, 10:13 AM  [facility] [address],  [place], [address] Phone: [phone]   Fax:  [phone]  Name: Milani Shivley MRN: [id number] Date of Birth: [birth year]

## 2015-11-30 ENCOUNTER — Encounter: Payer: Medicare Other | Admitting: Occupational Therapy

## 2015-12-05 ENCOUNTER — Encounter: Payer: Medicare Other | Admitting: Occupational Therapy

## 2016-02-07 ENCOUNTER — Ambulatory Visit: Payer: Medicare Other | Admitting: Sports Medicine

## 2016-02-07 ENCOUNTER — Encounter: Payer: Self-pay | Admitting: Sports Medicine

## 2016-02-07 ENCOUNTER — Ambulatory Visit (INDEPENDENT_AMBULATORY_CARE_PROVIDER_SITE_OTHER): Payer: Medicare Other | Admitting: Sports Medicine

## 2016-02-07 DIAGNOSIS — M21619 Bunion of unspecified foot: Secondary | ICD-10-CM

## 2016-02-07 DIAGNOSIS — M79676 Pain in unspecified toe(s): Secondary | ICD-10-CM

## 2016-02-07 DIAGNOSIS — B351 Tinea unguium: Secondary | ICD-10-CM | POA: Diagnosis not present

## 2016-02-07 DIAGNOSIS — M2040 Other hammer toe(s) (acquired), unspecified foot: Secondary | ICD-10-CM

## 2016-02-07 NOTE — Progress Notes (Signed)
Patient ID: Carmen Humphrey, female   DOB: 1925-03-08, 81 y.o.   MRN: [id number] Subjective: Carmen Humphrey is a 82 y.o. female patient seen today in office with complaint of painful thickened and elongated toenails; unable to trim. Patient denies history of Diabetes, Neuropathy, or Vascular disease. Patient has no other pedal complaints at this time.   Patient Active Problem List   Diagnosis Date Noted  . Dystrophic nail 07/18/2015  . Acquired claw toe of right foot 07/18/2015  . Blood pressure elevated without history of HTN 03/22/2015  . Arthritis of hand, left 03/22/2015  . Cognitive and neurobehavioral dysfunction 03/22/2015  . Coronary atherosclerosis of native coronary artery 03/22/2015  . Vitamin D deficiency 03/22/2015   Current Outpatient Prescriptions on File Prior to Visit  Medication Sig Dispense Refill  . Bisacodyl (LAXATIVE PO) Take 1 capsule by mouth daily. ADVANCE COLON CARE II    . Coenzyme Q10-Fish Oil-Vit E (CO-Q 10 OMEGA-3 FISH OIL) CAPS Take 2 capsules by mouth daily.    [name] etodolac (LODINE XL) 400 MG 24 hr tablet Take 1 tablet (400 mg total) by mouth daily. 30 tablet 5  . Lactulose 20 GM/30ML SOLN 30 ml every 4 hours until constipation is relieved 236 mL 0  . Lactulose 20 GM/30ML SOLN 30 ml every 4 hours until constipation is relieved    . Probiotic Product (PROBIOTIC & ACIDOPHILUS EX ST) CAPS Take 1 capsule by mouth daily as needed.    . Salicylic Acid 2 % CREA Apply topically.     No current facility-administered medications on file prior to visit.   Allergies  Allergen Reactions  . Ciprofloxacin Nausea And Vomiting  . Fish Allergy Other (See Comments)    Perch Fish    Objective: Physical Exam  General: Well developed, nourished, no acute distress, awake, alert and oriented x 3  Vascular: Dorsalis pedis artery 1/4 bilateral, Posterior tibial artery 1/4 bilateral, skin temperature warm to warm proximal to distal bilateral lower extremities, mild  varicosities, pedal hair present bilateral.  Neurological: Gross sensation present via light touch bilateral.   Dermatological: Skin is warm, dry, and supple bilateral, Nails 1-10 are tender, long, thick, and discolored with mild subungal debris, no webspace macerations present bilateral, no open lesions present bilateral, no callus/corns/hyperkeratotic tissue present bilateral. No signs of infection bilateral.  Musculoskeletal: Bunion and hammertoe deformities noted bilateral. Muscular strength within normal limits without pain on range of motion. No pain with calf compression bilateral.  Assessment and Plan:  Problem List Items Addressed This Visit    None    Visit Diagnoses    Dermatophytosis of nail    -  Primary    Pain of toe, unspecified laterality        Hammertoe, unspecified laterality        Bunion          -Examined patient.  -Discussed treatment options. -Mechanically debrided and reduced mycotic nails with sterile nail nipper and dremel nail file without incident. -Continue with crest pad and good supportive shoes daily for foot type.  -Patient to return in 3 months for follow up evaluation or sooner if symptoms worsen.  [name], DPM

## 2016-04-27 ENCOUNTER — Ambulatory Visit (INDEPENDENT_AMBULATORY_CARE_PROVIDER_SITE_OTHER): Payer: Medicare Other | Admitting: Sports Medicine

## 2016-04-27 ENCOUNTER — Encounter: Payer: Self-pay | Admitting: Sports Medicine

## 2016-04-27 DIAGNOSIS — M2040 Other hammer toe(s) (acquired), unspecified foot: Secondary | ICD-10-CM | POA: Diagnosis not present

## 2016-04-27 DIAGNOSIS — M21619 Bunion of unspecified foot: Secondary | ICD-10-CM | POA: Diagnosis not present

## 2016-04-27 DIAGNOSIS — B351 Tinea unguium: Secondary | ICD-10-CM | POA: Diagnosis not present

## 2016-04-27 DIAGNOSIS — M79676 Pain in unspecified toe(s): Secondary | ICD-10-CM

## 2016-04-27 NOTE — Progress Notes (Signed)
Patient ID: Carmen Humphrey, female   DOB: [birth year], 82 y.o.   MRN: [id number]  Subjective: Carmen Humphrey is a 82 y.o. female patient seen today in office with complaint of painful thickened and elongated toenails; unable to trim. Patient denies changes with medical history since last visit. Patient has no other pedal complaints at this time.   Patient Active Problem List   Diagnosis Date Noted  . Dystrophic nail 07/18/2015  . Acquired claw toe of right foot 07/18/2015  . Blood pressure elevated without history of HTN 03/22/2015  . Arthritis of hand, left 03/22/2015  . Cognitive and neurobehavioral dysfunction 03/22/2015  . Coronary atherosclerosis of native coronary artery 03/22/2015  . Vitamin D deficiency 03/22/2015   Current Outpatient Prescriptions on File Prior to Visit  Medication Sig Dispense Refill  . Bisacodyl (LAXATIVE PO) Take 1 capsule by mouth daily. ADVANCE COLON CARE II    . Coenzyme Q10-Fish Oil-Vit E (CO-Q 10 OMEGA-3 FISH OIL) CAPS Take 2 capsules by mouth daily.    [name] etodolac (LODINE XL) 400 MG 24 hr tablet Take 1 tablet (400 mg total) by mouth daily. 30 tablet 5  . Lactulose 20 GM/30ML SOLN 30 ml every 4 hours until constipation is relieved 236 mL 0  . Lactulose 20 GM/30ML SOLN 30 ml every 4 hours until constipation is relieved    . Probiotic Product (PROBIOTIC & ACIDOPHILUS EX ST) CAPS Take 1 capsule by mouth daily as needed.    . Salicylic Acid 2 % CREA Apply topically.     No current facility-administered medications on file prior to visit.   Allergies  Allergen Reactions  . Ciprofloxacin Nausea And Vomiting  . Fish Allergy Other (See Comments)    Perch Fish    Objective: Physical Exam  General: Well developed, nourished, no acute distress, awake, alert and oriented x 3  Vascular: Dorsalis pedis artery 1/4 bilateral, Posterior tibial artery 1/4 bilateral, skin temperature warm to warm proximal to distal bilateral lower extremities, mild varicosities,  pedal hair present bilateral.  Neurological: Gross sensation present via light touch bilateral.   Dermatological: Skin is warm, dry, and supple bilateral, Nails 1-10 are tender, long, thick, and discolored with mild subungal debris, no webspace macerations present bilateral, no open lesions present bilateral, no callus/corns/hyperkeratotic tissue present bilateral. No signs of infection bilateral.  Musculoskeletal: Bunion and hammertoe deformities noted bilateral. Muscular strength within normal limits without pain on range of motion. No pain with calf compression bilateral.  Assessment and Plan:  Problem List Items Addressed This Visit    None    Visit Diagnoses    Dermatophytosis of nail    -  Primary    Pain of toe, unspecified laterality        Hammertoe, unspecified laterality        Bunion          -Examined patient.  -Discussed treatment options. -Mechanically debrided and reduced mycotic nails with sterile nail nipper and dremel nail file without incident. -Continue with crest pad and good supportive shoes daily for foot type.  -Patient to return in 3 months for follow up evaluation or sooner if symptoms worsen.  [name], DPM

## 2016-05-15 ENCOUNTER — Ambulatory Visit: Payer: Medicare Other | Admitting: Sports Medicine

## 2016-07-17 NOTE — H&P (Signed)
Carmen Humphrey is a 82 y.o. female here for Thickened endometrial stripe .   HPI:  Pt of Dr. Laurell Josephs. She was initially seen in 09/30/2015 for chronic abdominal pain and incidental finding of thickened endometrium on CTABP.   For the past year, the patient has had chronic abdominal pain, +bloating and +constipation. Her bowel movements were black and tarry at a time and small like pellets. She does not eat as much as she did before. She was seen by GI who ordered a CTABP which had an incidental finding of 83mm EMS.   She underwent an ultrasound at [facility] which showed a 30mm EMS with fluid and possible polyp as well as a 1.5cm complex mass in the right adnexa, possible dermoid. CA-125 was normal. We had attemptedan endometrial biopsy at her last visit, but she has a flush cervix with stenosis, and the biopsy was unable to be obtained.   She has been worried about this procedure since discussing it with Dr. Newman Nip. Last visit in 04/2016.  Today we discussed her fears, which mostly center on the risks of anesthesia at her age. I discussed with the anesthesiologist on today the possibility of iv sedation with or without LMA and cervical block. This is reasonable and doable almost all the time. We will have anesthesia see her at her preop appointment.    Past Medical History:  has a past medical history of Anxiety; Arthritis; Atherosclerotic heart disease of native coronary artery without angina pectoris (03/22/2015); Colon polyp; Constipation (04/27/2015); GERD (gastroesophageal reflux disease); Hyperlipidemia; Hypertension; Psoriasis, unspecified; Syncope and collapse; and Thyroid disease.  Past Surgical History:  has a past surgical history that includes ligation/transection fallopian tubes vaginal; Colonoscopy; Tonsillectomy; foot surgery (Right); and Breast surgery. Family History: family history includes Coronary artery disease in her brother, father, and mother; Hypertension in her father and mother;  Pacemaker in her mother. Social History:  reports that she has never smoked. She has never used smokeless tobacco. She reports that she does not drink alcohol or use illicit drugs. OB/GYN History:  OB History    Gravida Para Term Preterm AB Living   3 2 2  1 2    SAB TAB Ectopic Multiple Live Births   1          Allergies: is allergic to ciprofloxacin and fish containing products. Medications:  Current Outpatient Prescriptions:  .  cholecalciferol (CHOLECALCIFEROL) 1,000 unit tablet, Take by mouth., Disp: , Rfl:  .  herbal drugs (COLON HERBAL CLEANSER) Cap, Take by mouth., Disp: , Rfl:  .  Herbal Supplement, Herbal Name: Vital 3 Joint solution 3 drops daily for 6 weeks, Disp: , Rfl:  .  L.ACID/L.CASEI/B.BIF/B.LON/FOS (PROBIOTIC BLEND ORAL), Take 1 tablet by mouth once daily., Disp: , Rfl:  .  psyllium husk, with sugar, (METAMUCIL) 3.4 gram/12 gram oral powder, Take 3.4 g by mouth once daily as needed for Constipation. Mix with a full glass (240 mL) of fluid., Disp: , Rfl:  .  UBIDECAR/FISH OIL/OMEGA-3/VITE (CO Q10-FISH OIL-OMEGA 3-E ORAL), Take 2 tablets by mouth once daily., Disp: , Rfl:  .  zinc oxide 10 % ointment, Apply topically as needed for Rash., Disp: , Rfl:   Review of Systems: No SOB, no palpitations or chest pain, no new lower extremity edema, no nausea or vomiting or bowel or bladder complaints. See HPI for gyn specific ROS.    Exam:      Vitals:   07/03/16 1024  BP: (P) 145/61  Pulse: (P) 78  WDWN black female in NAD Body mass index is 20.45 kg/(m^2) (pended).  General: Patient is well-groomed, well-nourished, appears stated age in no acute distress  HEENT: head is atraumatic and normocephalic, trachea is midline, neck is supple with no palpable nodules  CV: Regular rhythm and normal heart rate, no murmur  Pulm: Clear to auscultation throughout lung fields with no wheezing, crackles, or rhonchi. No increased work of  breathing  Abdomen: soft , no mass, non-tender, no rebound tenderness, no hepatomegaly  Pelvic: tanner stage 5 ,  Deferred, prior exam adequate   Impression:   The encounter diagnosis was Endometrial thickening on ultra sound.    Plan:   -  Preoperative visit: D&C hysteroscopy, for 71mm stripe with endometrial fluid. Consents signed today. Risks of surgery were discussed with the patient including but not limited to: bleeding which may require transfusion; infection which may require antibiotics; injury to uterus or surrounding organs; intrauterine scarring which may impair future fertility; need for additional procedures including laparotomy or laparoscopy; and other postoperative/anesthesia complications. Written informed consent was obtained.  Plan for iv sedation. Anesthesia to see patient at preop visit. Cervical block during case with 20cc 1% lidocaine.  This is a scheduled same-day surgery. She will have a postop visit in 2 weeks to review operative findings and pathology. Return in about 4 weeks (around 07/31/2016) for Postop check.  Sherrie George, MD

## 2016-07-19 ENCOUNTER — Encounter
Admission: RE | Admit: 2016-07-19 | Discharge: 2016-07-19 | Disposition: A | Discharge status: Home / Self Care | Payer: Medicare Other | Source: Ambulatory Visit | Attending: Obstetrics and Gynecology | Admitting: Obstetrics and Gynecology

## 2016-07-19 DIAGNOSIS — Z01812 Encounter for preprocedural laboratory examination: Secondary | ICD-10-CM | POA: Diagnosis present

## 2016-07-19 DIAGNOSIS — I519 Heart disease, unspecified: Secondary | ICD-10-CM | POA: Diagnosis not present

## 2016-07-19 HISTORY — DX: Fibromyalgia: M79.7

## 2016-07-19 HISTORY — DX: Urgency of urination: R39.15

## 2016-07-19 HISTORY — DX: Anorexia: R63.0

## 2016-07-19 HISTORY — DX: Reserved for inherently not codable concepts without codable children: IMO0001

## 2016-07-19 HISTORY — DX: Change in bowel habit: R19.4

## 2016-07-19 HISTORY — DX: Unspecified abdominal pain: R10.9

## 2016-07-19 HISTORY — DX: Major depressive disorder, single episode, unspecified: F32.9

## 2016-07-19 HISTORY — DX: Unspecified osteoarthritis, unspecified site: M19.90

## 2016-07-19 HISTORY — DX: Depression, unspecified: F32.A

## 2016-07-19 LAB — BASIC METABOLIC PANEL
Anion gap: 7 (ref 5–15)
BUN: 9 mg/dL (ref 6–20)
CALCIUM: 9.3 mg/dL (ref 8.9–10.3)
CO2: 26 mmol/L (ref 22–32)
CREATININE: 0.61 mg/dL (ref 0.44–1.00)
Chloride: 107 mmol/L (ref 101–111)
GFR calc Af Amer: >60 mL/min (ref >60)
GFR calc non Af Amer: >60 mL/min (ref >60)
GLUCOSE: 89 mg/dL (ref 65–99)
Potassium: 3.8 mmol/L (ref 3.5–5.1)
Sodium: 140 mmol/L (ref 135–145)

## 2016-07-19 LAB — CBC
HCT: 39.6 % (ref 35.0–47.0)
Hemoglobin: 13.8 g/dL (ref 12.0–16.0)
MCH: 30.3 pg (ref 26.0–34.0)
MCHC: 34.8 g/dL (ref 32.0–36.0)
MCV: 87.1 fL (ref 80.0–100.0)
PLATELETS: 247 10*3/uL (ref 150–440)
RBC: 4.54 MIL/uL (ref 3.80–5.20)
RDW: 13.4 % (ref 11.5–14.5)
WBC: 5.7 10*3/uL (ref 3.6–11.0)

## 2016-07-19 LAB — TYPE AND SCREEN
ABO/RH(D): AB POS
Antibody Screen: NEGATIVE

## 2016-07-19 NOTE — Pre-Procedure Instructions (Signed)
Progress Notes - in this encounter  Isaias Cowman, MD - 01/03/2016 11:15 AM EST Formatting of this note may be different from the original. Established Patient Visit   Chief Complaint: Chief Complaint  Patient presents with  . Chest Pain  Tightness and stabbing  . Edema  Swelling on both legs from feet to thighs  Date of Service: 01/03/2016 Date of Birth: [birth year] PCP: Delorise Jackson, MD  History of Present Illness: Ms. Dillahunt is a 82 y.o.female patient who returns for essential hypertension, and hyperlipidemia. Patient reports intermittent episodes of chest discomfort, which appears to be nonexertional. She experiences left sided chest discomfort which extends into her left axilla and down her arm, typically occurs at night, less notable during the day, not associated with shortness of breath, nausea, vomiting or diaphoresis. She denies palpitations or heart racing. He does have intermittent peripheral edema. The patient reports that she has not been adhering to a low-sodium diet. ECG today reveals normal sinus rhythm, normal ECG. 2D echocardiogram 04/08/15 revealed normal left ventricular function with LV ejection fraction greater than 55%.  The patient has essential hypertension, blood pressure well controlled on diet alone. The patient not been adhering to low-sodium, no added salt diet.  The patient has hyperlipidemia, on diet therapy, follows a low-cholesterol, low-fat diet.  Past Medical and Surgical History  Past Medical History Past Medical History  Diagnosis Date  . Anxiety, unspecified  . Arthritis  . Atherosclerotic heart disease of native coronary artery without angina pectoris 03/22/2015  . Colon polyp  . Constipation 04/27/2015  . GERD (gastroesophageal reflux disease)  . Hyperlipidemia, unspecified  . Hypertension  . Psoriasis  . Syncope and collapse  . Thyroid disease   Past Surgical History She has a past surgical history that includes  ligation/transection fallopian tubes vaginal; Colonoscopy; Tonsillectomy; foot surgery (Right); and Breast surgery.   Medications and Allergies  Current Medications  Current Outpatient Prescriptions  Medication Sig Dispense Refill  . cholecalciferol (CHOLECALCIFEROL) 1,000 unit tablet Take by mouth.  . herbal drugs (COLON HERBAL CLEANSER) Cap Take by mouth.  . L.ACID/L.CASEI/B.BIF/B.LON/FOS (PROBIOTIC BLEND ORAL) Take 1 tablet by mouth once daily.  . psyllium husk, with sugar, (METAMUCIL) 3.4 gram/12 gram oral powder Take 3.4 g by mouth once daily as needed for Constipation. Mix with a full glass (240 mL) of fluid.  [name] UBIDECAR/FISH OIL/OMEGA-3/VITE (CO Q10-FISH OIL-OMEGA 3-E ORAL) Take 2 tablets by mouth once daily.  [name] zinc oxide 10 % ointment Apply topically as needed for Rash.   No current facility-administered medications for this visit.   Allergies: Ciprofloxacin and Fish containing products  Social and Family History  Social History reports that she has never smoked. She has never used smokeless tobacco. She reports that she does not drink alcohol or use illicit drugs.  Family History Family History  Problem Relation Age of Onset  . Coronary artery disease Mother  . Hypertension Mother  . Pacemaker Mother  . Coronary artery disease Father  . Hypertension Father  . Coronary artery disease Brother   Review of Systems   Review of Systems: The patient reports intermittent, nonexertional, nocturnal chest pain as described above, without shortness of breath, orthopnea, paroxysmal nocturnal dyspnea, pedal edema, palpitations, heart racing, presyncope, syncope. Review of 12 Systems is negative except as described above.  Physical Examination   Vitals: Visit Vitals  . BP 120/80  . Pulse 66  . Ht 167.6 cm (5\' 6" )  . Wt 59.9 kg (132 lb)  . LMP (Exact  Date)  . BMI 21.31 kg/m2   Ht:167.6 cm (5\' 6" ) Wt:59.9 kg (132 lb) [id number] surface area is 1.67 meters squared. Body mass  index is 21.31 kg/(m^2).  HEENT: Pupils equally reactive to light and accomodation  Neck: Supple without thyromegaly, carotid pulses 2+ Lungs: clear to auscultation bilaterally; no wheezes, rales, rhonchi Heart: Regular rate and rhythm. No gallops, murmurs or rub Abdomen: soft nontender, nondistended, with normal bowel sounds Extremities: no cyanosis, clubbing, or edema Peripheral Pulses: 2+ in all extremities, 2+ femoral pulses bilaterally  Assessment   82 y.o. female with  1. Acute chest pain, unspecified  2. Chest pain with low risk for cardiac etiology, unspecified  3. SOB (shortness of breath) on exertion   81 year old female with intermittent episodes of nonexertional chest pain, likely noncardiac, normal ECG, normal left ventricular function by echocardiogram. The patient has essential hypertension, blood pressure well controlled on diet therapy alone.  Plan   1. Continue current medications 2. Counseled patient about low-sodium diet 3. DASH diet printed instructions given to the patient 4. Counseled patient about low-cholesterol diet 5. Low-fat and cholesterol diet printed instructions given to the patient 6. Return to clinic for follow-up in 3 months  Orders Placed This Encounter  Procedures  . ECG 12-lead   Return in about 3 months (around 04/01/2016).  Isaias Cowman, MD      Plan of Treatment - as of this encounter   Not on file   EKG Results - in this encounter    ECG 12-lead (01/03/2016 11:26 AM) ECG 12-lead (01/03/2016 11:26 AM)  Component Value Ref Range  Vent Rate (bpm) 66   PR Interval (msec) 184   QRS Interval (msec) 80   QT Interval (msec) 374   QTc (msec) 392    ECG 12-lead (01/03/2016 11:26 AM)  Specimen Performing Laboratory   [facility] GE MUSE RESULTS    ECG 12-lead (01/03/2016 11:26 AM)  Narrative  Normal sinus rhythm  Normal ECG  When compared with ECG of 25-Mar-2015 11:23,  No significant change was found  I  reviewed and concur with this report. Electronically signed [id number], MD, ALEX [phone]) on 01/05/2016 1:44:10

## 2016-07-19 NOTE — Pre-Procedure Instructions (Signed)
Vent Rate (bpm) 66   PR Interval (msec) 184   QRS Interval (msec) 80   QT Interval (msec) 374   QTc (msec) 392   Result Narrative  Normal sinus rhythm Normal ECG When compared with ECG of 25-Mar-2015 11:23, No significant change was found I reviewed and concur with this report. Electronically signed [id number], MD, Cristie Hem [id number]) on 01/05/2016 1:44:10 PM  Status Results Details

## 2016-07-19 NOTE — Pre-Procedure Instructions (Signed)
At 1:30 pm, called anesthesia regarding consult, patient desires local, "afraid of general anesthesia".  We can probably do a cervical block.  I'm busy now will try to get there in a short while.  Called anesthesia at 2:30 pm, to remind regarding consult.

## 2016-07-19 NOTE — Patient Instructions (Signed)
  Your procedure is scheduled on: 07/23/16 Mon  ?6:30 am Report to Same Day Surgery 2nd floor medical mall To find out your arrival time please call [phone] between 1PM - 3PM on 07/20/16 Fri  Remember: Instructions that are not followed completely may result in serious medical risk, up to and including death, or upon the discretion of your surgeon and anesthesiologist your surgery may need to be rescheduled.    _x___ 1. Do not eat food or drink liquids after midnight. No gum chewing or hard candies.     __x__ 2. No Alcohol for 24 hours before or after surgery.   __x__3. No Smoking for 24 prior to surgery.   ____  4. Bring all medications with you on the day of surgery if instructed.    __x__ 5. Notify your doctor if there is any change in your medical condition     (cold, fever, infections).     Do not wear jewelry, make-up, hairpins, clips or nail polish.  Do not wear lotions, powders, or perfumes. You may wear deodorant.  Do not shave 48 hours prior to surgery. Men may shave face and neck.  Do not bring valuables to the hospital.    [facility] is not responsible for any belongings or valuables.               Contacts, dentures or bridgework may not be worn into surgery.  Leave your suitcase in the car. After surgery it may be brought to your room.  For patients admitted to the hospital, discharge time is determined by your treatment team.   Patients discharged the day of surgery will not be allowed to drive home.    Please read over the following fact sheets that you were given:   [facility] Preparing for Surgery and or MRSA Information   _x___ Take these medicines the morning of surgery with A SIP OF WATER:    1. None  2.  3.  4.  5.  6.  ____ Fleet Enema (as directed)   _x___ Use CHG Soap or sage wipes as directed on instruction sheet   ____ Use inhalers on the day of surgery and bring to hospital day of surgery  ____ Stop metformin 2 days prior to  surgery    ____ Take 1/2 of usual insulin dose the night before surgery and none on the morning of           surgery.   ____ Stop aspirin or coumadin, or plavix  _x__ Stop Anti-inflammatories such as Advil, Aleve, Ibuprofen, Motrin, Naproxen,          Naprosyn, Goodies powders or aspirin products. Ok to take Tylenol.   ____ Stop supplements until after surgery.    ____ Bring C-Pap to the hospital.

## 2016-07-19 NOTE — Pre-Procedure Instructions (Signed)
14:40 Dr Rosey Bath in for anesthesia consult.

## 2016-07-23 ENCOUNTER — Encounter: Payer: Self-pay | Admitting: Emergency Medicine

## 2016-07-23 ENCOUNTER — Ambulatory Visit: Payer: Medicare Other | Admitting: Anesthesiology

## 2016-07-23 ENCOUNTER — Ambulatory Visit
Admission: RE | Admit: 2016-07-23 | Discharge: 2016-07-23 | Disposition: A | Discharge status: Home / Self Care | Payer: Medicare Other | Source: Ambulatory Visit | Attending: Obstetrics and Gynecology | Admitting: Obstetrics and Gynecology

## 2016-07-23 ENCOUNTER — Encounter: Payer: Self-pay | Admitting: *Deleted

## 2016-07-23 ENCOUNTER — Emergency Department
Admission: EM | Admit: 2016-07-23 | Discharge: 2016-07-23 | Disposition: A | Discharge status: Home / Self Care | Payer: Medicare Other | Source: Home / Self Care | Attending: Emergency Medicine | Admitting: Emergency Medicine

## 2016-07-23 ENCOUNTER — Encounter
Admission: RE | Disposition: A | Discharge status: Home / Self Care | Payer: Self-pay | Source: Ambulatory Visit | Attending: Obstetrics and Gynecology

## 2016-07-23 DIAGNOSIS — Z859 Personal history of malignant neoplasm, unspecified: Secondary | ICD-10-CM

## 2016-07-23 DIAGNOSIS — E785 Hyperlipidemia, unspecified: Secondary | ICD-10-CM | POA: Insufficient documentation

## 2016-07-23 DIAGNOSIS — I10 Essential (primary) hypertension: Secondary | ICD-10-CM | POA: Diagnosis not present

## 2016-07-23 DIAGNOSIS — Z86010 Personal history of colonic polyps: Secondary | ICD-10-CM | POA: Diagnosis not present

## 2016-07-23 DIAGNOSIS — Z8639 Personal history of other endocrine, nutritional and metabolic disease: Secondary | ICD-10-CM | POA: Diagnosis not present

## 2016-07-23 DIAGNOSIS — K5900 Constipation, unspecified: Secondary | ICD-10-CM | POA: Diagnosis not present

## 2016-07-23 DIAGNOSIS — R938 Abnormal findings on diagnostic imaging of other specified body structures: Secondary | ICD-10-CM | POA: Diagnosis not present

## 2016-07-23 DIAGNOSIS — R109 Unspecified abdominal pain: Secondary | ICD-10-CM | POA: Insufficient documentation

## 2016-07-23 DIAGNOSIS — G8929 Other chronic pain: Secondary | ICD-10-CM | POA: Insufficient documentation

## 2016-07-23 DIAGNOSIS — K219 Gastro-esophageal reflux disease without esophagitis: Secondary | ICD-10-CM | POA: Diagnosis not present

## 2016-07-23 DIAGNOSIS — M1990 Unspecified osteoarthritis, unspecified site: Secondary | ICD-10-CM | POA: Insufficient documentation

## 2016-07-23 DIAGNOSIS — I2510 Atherosclerotic heart disease of native coronary artery without angina pectoris: Secondary | ICD-10-CM | POA: Insufficient documentation

## 2016-07-23 DIAGNOSIS — N939 Abnormal uterine and vaginal bleeding, unspecified: Secondary | ICD-10-CM

## 2016-07-23 DIAGNOSIS — F419 Anxiety disorder, unspecified: Secondary | ICD-10-CM | POA: Diagnosis not present

## 2016-07-23 DIAGNOSIS — L409 Psoriasis, unspecified: Secondary | ICD-10-CM | POA: Diagnosis not present

## 2016-07-23 HISTORY — PX: HYSTEROSCOPY WITH D & C: SHX1775

## 2016-07-23 SURGERY — DILATATION AND CURETTAGE /HYSTEROSCOPY
Anesthesia: General

## 2016-07-23 MED ORDER — LIDOCAINE-EPINEPHRINE 1 %-1:100000 IJ SOLN
INTRAMUSCULAR | Status: AC
  Filled 2016-07-23: qty 1

## 2016-07-23 MED ORDER — MIDAZOLAM HCL 5 MG/5ML IJ SOLN
INTRAMUSCULAR | Status: DC | PRN
  Administered 2016-07-23: 1 mg via INTRAVENOUS

## 2016-07-23 MED ORDER — DOCUSATE SODIUM 100 MG PO CAPS: 100.0000 mg | ORAL_CAPSULE | Freq: Every day | ORAL | 3 refills | Status: DC | PRN

## 2016-07-23 MED ORDER — IBUPROFEN 800 MG PO TABS: 800.0000 mg | ORAL_TABLET | Freq: Three times a day (TID) | ORAL | 0 refills | Status: AC | PRN

## 2016-07-23 MED ORDER — LACTATED RINGERS IV SOLN
INTRAVENOUS | Status: DC
  Administered 2016-07-23: 08:00:00 via INTRAVENOUS

## 2016-07-23 MED ORDER — ONDANSETRON HCL 4 MG/2ML IJ SOLN
INTRAMUSCULAR | Status: DC | PRN
  Administered 2016-07-23: 4 mg via INTRAVENOUS

## 2016-07-23 MED ORDER — FENTANYL CITRATE (PF) 100 MCG/2ML IJ SOLN
INTRAMUSCULAR | Status: DC | PRN
Start: 2016-07-23 — End: 2016-07-23
  Administered 2016-07-23 (×2): 25 ug via INTRAVENOUS

## 2016-07-23 MED ORDER — FAMOTIDINE 20 MG PO TABS
ORAL_TABLET | ORAL | Status: AC
  Filled 2016-07-23: qty 1

## 2016-07-23 MED ORDER — PROPOFOL 500 MG/50ML IV EMUL
INTRAVENOUS | Status: DC | PRN
Start: 2016-07-23 — End: 2016-07-23
  Administered 2016-07-23: 80 ug/kg/min via INTRAVENOUS

## 2016-07-23 MED ORDER — FAMOTIDINE 20 MG PO TABS
20.0000 mg | ORAL_TABLET | Freq: Once | ORAL | Status: AC
  Administered 2016-07-23: 20 mg via ORAL

## 2016-07-23 MED ORDER — OXYCODONE HCL 5 MG PO TABS: 5.0000 mg | ORAL_TABLET | Freq: Once | ORAL | Status: DC | PRN

## 2016-07-23 MED ORDER — PROPOFOL 10 MG/ML IV BOLUS
INTRAVENOUS | Status: DC | PRN
  Administered 2016-07-23 (×6): 20 mg via INTRAVENOUS

## 2016-07-23 MED ORDER — FENTANYL CITRATE (PF) 100 MCG/2ML IJ SOLN
25.0000 ug | INTRAMUSCULAR | Status: DC | PRN
  Administered 2016-07-23: 25 ug via INTRAVENOUS

## 2016-07-23 MED ORDER — OXYCODONE HCL 5 MG/5ML PO SOLN: 5.0000 mg | Freq: Once | ORAL | Status: DC | PRN

## 2016-07-23 MED ORDER — LIDOCAINE-EPINEPHRINE 1 %-1:100000 IJ SOLN
INTRAMUSCULAR | Status: DC | PRN
  Administered 2016-07-23: 10 mL

## 2016-07-23 MED ORDER — LACTATED RINGERS IV SOLN
INTRAVENOUS | Status: DC
  Administered 2016-07-23: 07:00:00 via INTRAVENOUS

## 2016-07-23 MED ORDER — LIDOCAINE HCL (CARDIAC) 20 MG/ML IV SOLN
INTRAVENOUS | Status: DC | PRN
  Administered 2016-07-23: 40 mg via INTRATRACHEAL

## 2016-07-23 MED ORDER — FENTANYL CITRATE (PF) 100 MCG/2ML IJ SOLN
INTRAMUSCULAR | Status: AC
  Filled 2016-07-23: qty 2

## 2016-07-23 SURGICAL SUPPLY — 21 items
CANISTER SUCT 3000ML (MISCELLANEOUS) ×3 IMPLANT
CATH ROBINSON RED A/P 16FR (CATHETERS) ×3 IMPLANT
CORD URO TURP 10FT (MISCELLANEOUS) IMPLANT
CUP MEDICINE 2OZ PLAST GRAD ST (MISCELLANEOUS) ×3 IMPLANT
ELECT LOOP MED HF 24F 12D (CUTTING LOOP) IMPLANT
ELECT REM PT RETURN 9FT ADLT (ELECTROSURGICAL) ×3
ELECT RESECT POWERBALL 24F (MISCELLANEOUS) IMPLANT
ELECTRODE REM PT RTRN 9FT ADLT (ELECTROSURGICAL) ×1 IMPLANT
GLOVE BIO SURGEON STRL SZ 6.5 (GLOVE) ×4 IMPLANT
GLOVE BIO SURGEONS STRL SZ 6.5 (GLOVE) ×2
GLOVE INDICATOR 7.0 STRL GRN (GLOVE) ×6 IMPLANT
GOWN STRL REUS W/ TWL LRG LVL3 (GOWN DISPOSABLE) ×2 IMPLANT
GOWN STRL REUS W/TWL LRG LVL3 (GOWN DISPOSABLE) ×4
IV LACTATED RINGERS 1000ML (IV SOLUTION) IMPLANT
KIT RM TURNOVER CYSTO AR (KITS) ×3 IMPLANT
PACK DNC HYST (MISCELLANEOUS) ×3 IMPLANT
PAD OB MATERNITY 4.3X12.25 (PERSONAL CARE ITEMS) ×3 IMPLANT
PAD PREP 24X41 OB/GYN DISP (PERSONAL CARE ITEMS) ×3 IMPLANT
SYRINGE 10CC LL (SYRINGE) ×3 IMPLANT
TUBING CONNECTING 10 (TUBING) ×2 IMPLANT
TUBING CONNECTING 10' (TUBING) ×1

## 2016-07-23 NOTE — Addendum Note (Signed)
Addendum  created 07/23/16 1050 by Andria Frames, MD   SmartForm saved

## 2016-07-23 NOTE — ED Triage Notes (Signed)
Pt just had biopsy this am and started having some vaginal bleeding when she got home today. Tried to call the office of surgeon but they are not answered.

## 2016-07-23 NOTE — Transfer of Care (Signed)
Immediate Anesthesia Transfer of Care Note  Patient: Carmen Humphrey  Procedure(s) Performed: Procedure(s): DILATATION AND CURETTAGE (N/A)  Patient Location: PACU  Anesthesia Type:MAC  Level of Consciousness: awake  Airway & Oxygen Therapy: Patient Spontanous Breathing  Post-op Assessment: Report given to RN  Post vital signs: Reviewed  Last Vitals:  Vitals:   07/23/16 0848 07/23/16 0854  BP:  (P) 125/81  Pulse: 74   Resp:    Temp: 36.6 C (P) 36.6 C    Last Pain:  Vitals:   07/23/16 0610  TempSrc: Oral         Complications: No apparent anesthesia complications

## 2016-07-23 NOTE — Discharge Instructions (Signed)
Please seek medical attention for any high fevers, chest pain, shortness of breath, change in behavior, persistent vomiting, bloody stool or any other new or concerning symptoms.  

## 2016-07-23 NOTE — Anesthesia Preprocedure Evaluation (Addendum)
Anesthesia Evaluation  Patient identified by MRN, date of birth, ID band Patient awake    Reviewed: Allergy & Precautions, H&P , NPO status , Patient's Chart, lab work & pertinent test results  History of Anesthesia Complications Negative for: history of anesthetic complications  Airway Mallampati: III  TM Distance: <3 FB Neck ROM: limited    Dental  (+) Poor Dentition, Chipped, Missing Many chipped and broken teeth, patient reports that none of these are loose:   Pulmonary shortness of breath and with exertion,    Pulmonary exam normal breath sounds clear to auscultation       Cardiovascular Exercise Tolerance: Good (-) angina+ CAD  (-) Past MI Normal cardiovascular exam Rhythm:regular Rate:Normal     Neuro/Psych PSYCHIATRIC DISORDERS Depression  Neuromuscular disease    GI/Hepatic negative GI ROS, Neg liver ROS,   Endo/Other  negative endocrine ROS  Renal/GU negative Renal ROS  negative genitourinary   Musculoskeletal  (+) Arthritis , Fibromyalgia -  Abdominal   Peds  Hematology negative hematology ROS (+)   Anesthesia Other Findings Past Medical History: No date: Abdominal pain No date: Arthritis No date: Bowel habit changes     Comment: pressure No date: Cancer ([place]) No date: Depression No date: Fibromyalgia 2012: Heart disease No date: History of fainting spells of unknown cause No date: Inflammatory polyps of colon (HCC) No date: Poor appetite No date: Shortness of breath dyspnea No date: Urinary urgency  Past Surgical History: No date: BREAST SURGERY No date: ECTOPIC PREGNANCY SURGERY No date: TUBAL LIGATION     Reproductive/Obstetrics negative OB ROS                            Anesthesia Physical Anesthesia Plan  ASA: III  Anesthesia Plan: MAC   Post-op Pain Management:    Induction:   Airway Management Planned:   Additional Equipment:   Intra-op Plan:    Post-operative Plan:   Informed Consent: I have reviewed the patients History and Physical, chart, labs and discussed the procedure including the risks, benefits and alternatives for the proposed anesthesia with the patient or authorized representative who has indicated his/her understanding and acceptance.   Dental Advisory Given  Plan Discussed with: Anesthesiologist, CRNA and Surgeon  Anesthesia Plan Comments: (MAC with backup GA  Patient and family member informed that patient is higher risk for complications from anesthesia during this procedure due to their medical history and age including but not limited to post op cognitive impairment.  Patient and family member voiced understanding. )       Anesthesia Quick Evaluation

## 2016-07-23 NOTE — ED Provider Notes (Signed)
[facility] Emergency Department Provider Note    ____________________________________________   I have reviewed the triage vital signs and the nursing notes.   HISTORY  Chief Complaint Vaginal Bleeding   History limited by: Not Limited   HPI Carmen Humphrey is a 82 y.o. female who presents to the emergency department today because of concern for vaginal bleeding. The patient underwent an OB/GYN procedure today where they took a biopsy. After arriving back home she started having bleeding. She states she had 2 episodes of passing bright red blood. She additionally has had some mild discomfort in the pelvic area. The patient tried getting contact with her OB/GYN doctor however was not able to reach her.   Past Medical History:  Diagnosis Date  . Abdominal pain   . Arthritis   . Bowel habit changes    pressure  . Cancer ([place])   . Depression   . Fibromyalgia   . Heart disease 2012  . History of fainting spells of unknown cause   . Inflammatory polyps of colon ([place])   . Poor appetite   . Shortness of breath dyspnea   . Urinary urgency     Patient Active Problem List   Diagnosis Date Noted  . Dystrophic nail 07/18/2015  . Acquired claw toe of right foot 07/18/2015  . Blood pressure elevated without history of HTN 03/22/2015  . Arthritis of hand, left 03/22/2015  . Cognitive and neurobehavioral dysfunction 03/22/2015  . Coronary atherosclerosis of native coronary artery 03/22/2015  . Vitamin D deficiency 03/22/2015    Past Surgical History:  Procedure Laterality Date  . BREAST SURGERY    . ECTOPIC PREGNANCY SURGERY    . TUBAL LIGATION      Prior to Admission medications   Medication Sig Start Date End Date Taking? Authorizing Provider  Cholecalciferol (VITAMIN D3) 1000 units CAPS Take 1 capsule by mouth daily.    Historical Provider, MD  Coenzyme Q10-Fish Oil-Vit E (CO-Q 10 OMEGA-3 FISH OIL) CAPS Take 2 capsules by mouth daily.     Historical Provider, MD  docusate sodium (COLACE) 100 MG capsule Take 1 capsule (100 mg total) by mouth daily as needed for mild constipation. 07/23/16   Benjaman Kindler, MD  ENSURE (ENSURE) Take 1 Can by mouth every other day.    Historical Provider, MD  ibuprofen (ADVIL,MOTRIN) 800 MG tablet Take 1 tablet (800 mg total) by mouth every 8 (eight) hours as needed for moderate pain or cramping. 07/23/16 07/30/16  Benjaman Kindler, MD  Misc Natural Products (COLON CARE PO) Take 1 capsule by mouth daily.    Historical Provider, MD  NON FORMULARY Apply 1 Dose topically daily. Blue stuff pain relief cream    Historical Provider, MD  NON FORMULARY Take 3 drops by mouth daily. Vital joint solution    Historical Provider, MD  Probiotic Product (PROBIOTIC & ACIDOPHILUS EX ST) CAPS Take 1 capsule by mouth daily as needed.    Historical Provider, MD  psyllium (METAMUCIL) 58.6 % powder Take 1 packet by mouth daily as needed.    Historical Provider, MD  Salicylic Acid 2 % CREA Apply topically.    Historical Provider, MD    Allergies Ciprofloxacin and Fish allergy  Family History  Problem Relation Age of Onset  . Heart disease Mother   . Hypertension Mother   . Heart disease Father   . Hypertension Father     Social History Social History  Substance Use Topics  . Smoking status: Never Smoker  .  Smokeless tobacco: Never Used  . Alcohol use No    Review of Systems  Constitutional: Negative for fever. Cardiovascular: Negative for chest pain. Respiratory: Negative for shortness of breath. Gastrointestinal: Negative for abdominal pain, vomiting and diarrhea. Neurological: Negative for headaches, focal weakness or numbness.  10-point ROS otherwise negative.  ____________________________________________   PHYSICAL EXAM:  VITAL SIGNS: ED Triage Vitals [07/23/16 1343]  Enc Vitals Group     BP (!) 158/77     Pulse Rate 65     Resp 18     Temp 98 F (36.7 C)     Temp Source Oral     SpO2 98  %     Weight 130 lb (59 kg)     Height 5\' 6"  (1.676 m)   Constitutional: Alert and oriented. Well appearing and in no distress. Eyes: Conjunctivae are normal. PERRL. Normal extraocular movements. ENT   Head: Normocephalic and atraumatic.   Nose: No congestion/rhinnorhea.   Mouth/Throat: Mucous membranes are moist.   Neck: No stridor. Hematological/Lymphatic/Immunilogical: No cervical lymphadenopathy. Cardiovascular: Normal rate, regular rhythm.  No murmurs, rubs, or gallops. Respiratory: Normal respiratory effort without tachypnea nor retractions. Breath sounds are clear and equal bilaterally. No wheezes/rales/rhonchi. Gastrointestinal: Soft and nontender. No distention.  Genitourinary: Deferred Musculoskeletal: Normal range of motion in all extremities. No joint effusions.  No lower extremity tenderness nor edema. Neurologic:  Normal speech and language. No gross focal neurologic deficits are appreciated.  Skin:  Skin is warm, dry and intact. No rash noted. Psychiatric: Mood and affect are normal. Speech and behavior are normal. Patient exhibits appropriate insight and judgment.  ____________________________________________    LABS (pertinent positives/negatives)  None  ____________________________________________   EKG  None  ____________________________________________    RADIOLOGY  None  ____________________________________________   PROCEDURES  Procedures  ____________________________________________   INITIAL IMPRESSION / ASSESSMENT AND PLAN / ED COURSE  Pertinent labs & imaging results that were available during my care of the patient were reviewed by me and considered in my medical decision making (see chart for details).  Patient presented to the emergency department today because of concerns for vaginal bleeding after a OB/GYN procedure today. Patient did not have any further bleeding here in the emergency department. Vital signs stable.  Will discharge follow-up with OB/GYN doctor. ____________________________________________   FINAL CLINICAL IMPRESSION(S) / ED DIAGNOSES  Final diagnoses:  Vaginal bleeding     Note: This dictation was prepared with Dragon dictation. Any transcriptional errors that result from this process are unintentional    Nance Pear, MD 07/23/16 [id number]

## 2016-07-23 NOTE — ED Notes (Signed)
To pts room to discharge - pt not in room, belongs not in room. Appears that pt has left. Went out to lobby but did not see pt.

## 2016-07-23 NOTE — Interval H&P Note (Signed)
History and Physical Interval Note:  07/23/2016 7:43 AM  Carmen Humphrey  has presented today for surgery, with the diagnosis of Thickened Endometrium  The various methods of treatment have been discussed with the patient and family. After consideration of risks, benefits and other options for treatment, the patient has consented to  Procedure(s): DILATATION AND CURETTAGE /HYSTEROSCOPY (N/A) as a surgical intervention . She is concerned about not waking up from anesthesia and after discussion with anesthesia we have determine to start with iv sedation with paracervical block.   The patient's history has been reviewed, patient examined, no change in status, stable for surgery.  I have reviewed the patient's chart and labs.  Questions were answered to the patient's satisfaction.     Benjaman Kindler

## 2016-07-23 NOTE — Anesthesia Postprocedure Evaluation (Signed)
Anesthesia Post Note  Patient: Consulting civil engineer  Procedure(s) Performed: Procedure(s) (LRB): DILATATION AND CURETTAGE (N/A)  Patient location during evaluation: PACU Anesthesia Type: General Level of consciousness: awake and alert Pain management: pain level controlled Vital Signs Assessment: post-procedure vital signs reviewed and stable Respiratory status: spontaneous breathing, nonlabored ventilation, respiratory function stable and patient connected to nasal cannula oxygen Cardiovascular status: blood pressure returned to baseline and stable Postop Assessment: no signs of nausea or vomiting Anesthetic complications: no    Last Vitals:  Vitals:   07/23/16 0947 07/23/16 1014  BP: (!) 142/71 131/68  Pulse: (!) 58 (!) 54  Resp: 14 14  Temp: (!) 35.8 C 36.5 C    Last Pain:  Vitals:   07/23/16 1014  TempSrc: Oral  PainSc:                  Precious Haws Nicolena Schurman

## 2016-07-23 NOTE — Discharge Instructions (Signed)
General Anesthesia, Adult °General anesthesia is a sleep-like state of non-feeling produced by medicines (anesthetics). General anesthesia prevents you from being alert and feeling pain during a medical procedure. Your caregiver may recommend general anesthesia if your procedure: °· Is long. °· Is painful or uncomfortable. °· Would be frightening to see or hear. °· Requires you to be still. °· Affects your breathing. °· Causes significant blood loss. °LET YOUR CAREGIVER KNOW ABOUT: °· Allergies to food or medicine. °· Medicines taken, including vitamins, herbs, eyedrops, over-the-counter medicines, and creams. °· Use of steroids (by mouth or creams). °· Previous problems with anesthetics or numbing medicines, including problems experienced by relatives. °· History of bleeding problems or blood clots. °· Previous surgeries and types of anesthetics received. °· Possibility of pregnancy, if this applies. °· Use of cigarettes, alcohol, or illegal drugs. °· Any health condition(s), especially diabetes, sleep apnea, and high blood pressure. °RISKS AND COMPLICATIONS °General anesthesia rarely causes complications. However, if complications do occur, they can be life threatening. Complications include: °· A lung infection. °· A stroke. °· A heart attack. °· Waking up during the procedure. When this occurs, the patient may be unable to move and communicate that he or she is awake. The patient may feel severe pain. °Older adults and adults with serious medical problems are more likely to have complications than adults who are young and healthy. Some complications can be prevented by answering all of your caregiver's questions thoroughly and by following all pre-procedure instructions. It is important to tell your caregiver if any of the pre-procedure instructions, especially those related to diet, were not followed. Any food or liquid in the stomach can cause problems when you are under general anesthesia. °BEFORE THE  PROCEDURE °· Ask your caregiver if you will have to spend the night at the hospital. If you will not have to spend the night, arrange to have an adult drive you and stay with you for 24 hours. °· Follow your caregiver's instructions if you are taking dietary supplements or medicines. Your caregiver may tell you to stop taking them or to reduce your dosage. °· Do not smoke for as long as possible before your procedure. If possible, stop smoking 3-6 weeks before the procedure. °· Do not take new dietary supplements or medicines within 1 week of your procedure unless your caregiver approves them. °· Do not eat within 8 hours of your procedure or as directed by your caregiver. Drink only clear liquids, such as water, black coffee (without milk or cream), and fruit juices (without pulp). °· Do not drink within 3 hours of your procedure or as directed by your caregiver. °· You may brush your teeth on the morning of the procedure, but make sure to spit out the toothpaste and water when finished. °PROCEDURE  °You will receive anesthetics through a mask, through an intravenous (IV) access tube, or through both. A doctor who specializes in anesthesia (anesthesiologist) or a nurse who specializes in anesthesia (nurse anesthetist) or both will stay with you throughout the procedure to make sure you remain unconscious. He or she will also watch your blood pressure, pulse, and oxygen levels to make sure that the anesthetics do not cause any problems. Once you are asleep, a breathing tube or mask may be used to help you breathe. °AFTER THE PROCEDURE °You will wake up after the procedure is complete. You may be in the room where the procedure was performed or in a recovery area. You may have a sore throat   if a breathing tube was used. You may also feel: °· Dizzy. °· Weak. °· Drowsy. °· Confused. °· Nauseous. °· Cold. °These are all normal responses and can be expected to last for up to 24 hours after the procedure is complete. A  caregiver will tell you when you are ready to go home. This will usually be when you are fully awake and in stable condition. °  °This information is not intended to replace advice given to you by your health care provider. Make sure you discuss any questions you have with your health care provider. °  °Document Released: 02/19/2008 Document Revised: 12/03/2014 Document Reviewed: 03/12/2012 °Elsevier Interactive Patient Education ©2016 [employer] ° °

## 2016-07-23 NOTE — Op Note (Signed)
Operative Report Hysteroscopy with Dilation and Curettage   Indications: Thickened endometrial stripe   Pre-operative Diagnosis:  1. Anxiety about anesthesia 2. Thickened endometrial stripe   Post-operative Diagnosis: same.  Procedure: 1. Exam under anesthesia 2. Fractional D&C  Surgeon: Benjaman Kindler, MD  Assistant(s):  None  Anesthesia: Monitored Local Anesthesia with Sedation'; 11ml of 1% lidocaine for paracervical block  Anesthesiologist: Andria Frames, MD Anesthesiologist: Andria Frames, MD CRNA: Silvana Newness, CRNA; Leander Rams, CRNA  Estimated Blood Loss:  Minimal         Intraoperative medications: none         Total IV Fluids: 630ml  Urine Output: 176ml         Specimens: Endometrial curettings         Complications:  None; patient tolerated the procedure well.         Disposition: PACU - hemodynamically stable.         Condition: stable  Findings: Normal atrophic vagina, perineum. Cervical stenosis, cervix was flush with the vaginal mucosa. The sound was unable to be passed beyond 4 cm with appropriate force. I did sample this area, but was unable to determine cervix from uterus by palpation.  Indication for procedure/Consents: 82 y.o. here for scheduled surgery for the aforementioned diagnoses.   Risks of surgery were discussed with the patient including but not limited to: bleeding which may require transfusion; infection which may require antibiotics; injury to uterus or surrounding organs; intrauterine scarring which may impair future fertility; need for additional procedures including laparotomy or laparoscopy; and other postoperative/anesthesia complications. Written informed consent was obtained.    Procedure Details:  Fractional D&C only  The patient was taken to the operating room where anesthesia was administered and was found to be adequate. After a formal and adequate timeout was performed, she was placed in the dorsal lithotomy  position and examined with the above findings. She was then prepped and draped in the sterile manner. Her bladder was catheterized for an estimated amount of clear, yellow urine. A bimanual exam noted a small retroverted uterus. A lighted deaver was then placed in the patient's vagina with a right angle retractor in the posterior vagina and a single tooth tenaculum was applied to the anterior lip of the cervix. The paracervical block was placed.  Exploration of the cervical os with a blunt sound was not satisfying. Multiple small dimples were noted at the distal end of the vagina but none led clearly to an endometrial cavity. The most likely canal was curetted for a small amount of tissue. The tenaculum was removed from the anterior lip of the cervix and the vaginal speculum was removed after applying pressure for good hemostasis.   The patient tolerated the procedure well and was taken to the recovery area awake and in stable condition.   The patient will be discharged to home as per PACU criteria. Routine postoperative instructions given. She was prescribed Ibuprofen and Colace. She will follow up in the clinic in two weeks for postoperative evaluation.

## 2016-07-25 LAB — SURGICAL PATHOLOGY

## 2016-07-31 ENCOUNTER — Encounter: Payer: Self-pay | Admitting: Podiatry

## 2016-07-31 ENCOUNTER — Ambulatory Visit (INDEPENDENT_AMBULATORY_CARE_PROVIDER_SITE_OTHER): Payer: Medicare Other | Admitting: Podiatry

## 2016-07-31 ENCOUNTER — Ambulatory Visit: Payer: Medicare Other | Admitting: Sports Medicine

## 2016-07-31 DIAGNOSIS — L84 Corns and callosities: Secondary | ICD-10-CM

## 2016-07-31 DIAGNOSIS — M79674 Pain in right toe(s): Secondary | ICD-10-CM | POA: Diagnosis not present

## 2016-07-31 DIAGNOSIS — M79675 Pain in left toe(s): Secondary | ICD-10-CM | POA: Diagnosis not present

## 2016-07-31 DIAGNOSIS — B351 Tinea unguium: Secondary | ICD-10-CM

## 2016-07-31 NOTE — Progress Notes (Signed)
SUBJECTIVE Patient with a history of diabetes mellitus presents to office today complaining of elongated, thickened nails. Pain while ambulating in shoes. Patient is unable to trim their own nails. Patient has no other pedal complaints at this time  Allergies  Allergen Reactions  . Ciprofloxacin Nausea And Vomiting  . Fish Allergy Other (See Comments)    [place] Patient is awake, alert, and oriented x 3 and in no acute distress. Derm Skin is dry and supple bilateral. Negative open lesions or macerations. Remaining integument unremarkable. Nails are tender, long, thickened and dystrophic with subungual debris, consistent with onychomycosis, 1-5 bilateral. No signs of infection noted. Vasc  DP and PT pedal pulses palpable bilaterally. Temperature gradient within normal limits.  Neuro Epicritic and protective threshold sensation diminished bilaterally.  Musculoskeletal Exam No symptomatic pedal deformities noted bilateral. Muscular strength within normal limits.  ASSESSMENT 1. Onychomycosis of nail due to dermatophyte bilateral 3. Pain in foot bilateral 4. Bunion and hammertoe deformity bilaterally as evident on clinical exam 5. Hyperkeratotic nails 1 through 5 bilaterally 6. Dorsal digital corn third digit right foot-painful 7. Callus noted to the plantar medial aspect of the left hallux-painful  PLAN OF CARE Patient evaluated today. Instructed to maintain good pedal hygiene and foot care. Stressed importance of controlling blood sugar.  Mechanical debridement of nails 1-5 bilaterally performed using a nail nipper. Filed with dremel without incident.  All patient questions were answered. Return to clinic in 3 mos.    Edrick Kins, DPM

## 2016-09-13 ENCOUNTER — Other Ambulatory Visit: Payer: Self-pay | Admitting: Rheumatology

## 2016-09-13 DIAGNOSIS — M25551 Pain in right hip: Principal | ICD-10-CM

## 2016-09-13 DIAGNOSIS — G8929 Other chronic pain: Secondary | ICD-10-CM

## 2016-09-19 ENCOUNTER — Ambulatory Visit
Admission: RE | Admit: 2016-09-19 | Discharge: 2016-09-19 | Disposition: A | Discharge status: Home / Self Care | Payer: Medicare Other | Source: Ambulatory Visit | Attending: Rheumatology | Admitting: Rheumatology

## 2016-09-19 DIAGNOSIS — M25551 Pain in right hip: Principal | ICD-10-CM

## 2016-09-19 DIAGNOSIS — G8929 Other chronic pain: Secondary | ICD-10-CM

## 2016-09-26 ENCOUNTER — Other Ambulatory Visit: Payer: Self-pay | Admitting: Neurology

## 2016-09-26 DIAGNOSIS — R441 Visual hallucinations: Secondary | ICD-10-CM

## 2016-09-26 DIAGNOSIS — R413 Other amnesia: Secondary | ICD-10-CM

## 2016-10-03 ENCOUNTER — Ambulatory Visit: Payer: Medicare Other

## 2016-10-30 ENCOUNTER — Ambulatory Visit: Payer: Medicare Other | Admitting: Podiatry

## 2016-11-06 ENCOUNTER — Encounter: Payer: Self-pay | Admitting: Podiatry

## 2016-11-06 ENCOUNTER — Ambulatory Visit (INDEPENDENT_AMBULATORY_CARE_PROVIDER_SITE_OTHER): Payer: Medicare Other | Admitting: Podiatry

## 2016-11-06 DIAGNOSIS — B351 Tinea unguium: Secondary | ICD-10-CM

## 2016-11-06 DIAGNOSIS — M79609 Pain in unspecified limb: Secondary | ICD-10-CM | POA: Diagnosis not present

## 2016-11-06 DIAGNOSIS — L603 Nail dystrophy: Secondary | ICD-10-CM

## 2016-11-06 DIAGNOSIS — L608 Other nail disorders: Secondary | ICD-10-CM

## 2016-11-06 NOTE — Progress Notes (Signed)
SUBJECTIVE Patient  presents to office today complaining of elongated, thickened nails. Pain while ambulating in shoes. Patient is unable to trim their own nails.   OBJECTIVE General Patient is awake, alert, and oriented x 3 and in no acute distress. Derm Skin is dry and supple bilateral. Negative open lesions or macerations. Remaining integument unremarkable. Nails are tender, long, thickened and dystrophic with subungual debris, consistent with onychomycosis, 1-5 bilateral. No signs of infection noted. Vasc  DP and PT pedal pulses palpable bilaterally. Temperature gradient within normal limits.  Neuro Epicritic and protective threshold sensation diminished bilaterally.  Musculoskeletal Exam No symptomatic pedal deformities noted bilateral. Muscular strength within normal limits.  ASSESSMENT 1. Onychodystrophic nails 1-5 bilateral with hyperkeratosis of nails.  2. Onychomycosis of nail due to dermatophyte bilateral 3. Pain in foot bilateral  PLAN OF CARE 1. Patient evaluated today.  2. Instructed to maintain good pedal hygiene and foot care.  3. Mechanical debridement of nails 1-5 bilaterally performed using a nail nipper. Filed with dremel without incident.  4. toe pad crests were dispensed today  5. Return to clinic in 3 mos.    Edrick Kins, DPM

## 2016-12-17 ENCOUNTER — Ambulatory Visit
Admission: RE | Admit: 2016-12-17 | Discharge: 2016-12-17 | Disposition: A | Discharge status: Home / Self Care | Payer: Medicare Other | Source: Ambulatory Visit | Attending: Neurology | Admitting: Neurology

## 2016-12-17 DIAGNOSIS — R443 Hallucinations, unspecified: Secondary | ICD-10-CM | POA: Insufficient documentation

## 2016-12-17 DIAGNOSIS — J328 Other chronic sinusitis: Secondary | ICD-10-CM | POA: Diagnosis not present

## 2016-12-17 DIAGNOSIS — R441 Visual hallucinations: Secondary | ICD-10-CM

## 2016-12-17 DIAGNOSIS — R413 Other amnesia: Secondary | ICD-10-CM | POA: Diagnosis present

## 2016-12-17 DIAGNOSIS — Z8673 Personal history of transient ischemic attack (TIA), and cerebral infarction without residual deficits: Secondary | ICD-10-CM | POA: Insufficient documentation

## 2016-12-17 DIAGNOSIS — I6782 Cerebral ischemia: Secondary | ICD-10-CM | POA: Diagnosis not present

## 2017-01-08 ENCOUNTER — Ambulatory Visit (INDEPENDENT_AMBULATORY_CARE_PROVIDER_SITE_OTHER): Payer: Medicare Other | Admitting: Podiatry

## 2017-01-08 ENCOUNTER — Ambulatory Visit (INDEPENDENT_AMBULATORY_CARE_PROVIDER_SITE_OTHER): Payer: Medicare Other

## 2017-01-08 DIAGNOSIS — L608 Other nail disorders: Secondary | ICD-10-CM | POA: Diagnosis not present

## 2017-01-08 DIAGNOSIS — M79671 Pain in right foot: Secondary | ICD-10-CM

## 2017-01-08 DIAGNOSIS — B351 Tinea unguium: Secondary | ICD-10-CM | POA: Diagnosis not present

## 2017-01-08 DIAGNOSIS — M79672 Pain in left foot: Secondary | ICD-10-CM

## 2017-01-08 DIAGNOSIS — L603 Nail dystrophy: Secondary | ICD-10-CM | POA: Diagnosis not present

## 2017-01-08 DIAGNOSIS — M79609 Pain in unspecified limb: Secondary | ICD-10-CM

## 2017-01-08 NOTE — Progress Notes (Signed)
   SUBJECTIVE Patient  presents to office today complaining of elongated, thickened nails. Pain while ambulating in shoes. Patient is unable to trim their own nails.   OBJECTIVE General Patient is awake, alert, and oriented x 3 and in no acute distress. Derm Skin is dry and supple bilateral. Negative open lesions or macerations. Remaining integument unremarkable. Nails are tender, long, thickened and dystrophic with subungual debris, consistent with onychomycosis, 1-5 bilateral. No signs of infection noted. Vasc  DP and PT pedal pulses palpable bilaterally. Temperature gradient within normal limits.  Neuro Epicritic and protective threshold sensation diminished bilaterally.  Musculoskeletal Exam No symptomatic pedal deformities noted bilateral. Muscular strength within normal limits.  ASSESSMENT 1. Onychodystrophic nails 1-5 bilateral with hyperkeratosis of nails.  2. Onychomycosis of nail due to dermatophyte bilateral 3. Pain in foot bilateral   PLAN OF CARE 1. Patient evaluated today.  2. Instructed to maintain good pedal hygiene and foot care.  3. Mechanical debridement of nails 1-5 bilaterally performed using a nail nipper. Filed with dremel without incident.  4. Silicone toe caps were dispensed for the second digit bilateral 5. Return to clinic in 3 mos.    Edrick Kins, DPM [facility]  Dr. Edrick Kins, [address]                                        [place], [place] [address]                Office [phone]  Fax [phone]

## 2017-02-18 ENCOUNTER — Ambulatory Visit: Payer: Medicare Other | Admitting: Podiatry

## 2017-03-25 ENCOUNTER — Ambulatory Visit (INDEPENDENT_AMBULATORY_CARE_PROVIDER_SITE_OTHER): Payer: Medicare Other | Admitting: Podiatry

## 2017-03-25 DIAGNOSIS — M79676 Pain in unspecified toe(s): Secondary | ICD-10-CM

## 2017-03-25 DIAGNOSIS — M79609 Pain in unspecified limb: Secondary | ICD-10-CM

## 2017-03-25 DIAGNOSIS — M21619 Bunion of unspecified foot: Secondary | ICD-10-CM

## 2017-03-25 DIAGNOSIS — B351 Tinea unguium: Secondary | ICD-10-CM

## 2017-03-25 DIAGNOSIS — M2040 Other hammer toe(s) (acquired), unspecified foot: Secondary | ICD-10-CM

## 2017-03-25 NOTE — Progress Notes (Signed)
Complaint:  Visit Type: Patient returns to my office for continued preventative foot care services. Complaint: Patient states" my nails have grown long and thick and become painful to walk and wear shoes" She is concerned that the pads are turning the toes black. The patient presents for preventative foot care services. No changes to ROS  Podiatric Exam: Vascular: dorsalis pedis and posterior tibial pulses are palpable bilateral. Capillary return is immediate. Temperature gradient is WNL. Skin turgor WNL  Sensorium: Normal Semmes Weinstein monofilament test. Normal tactile sensation bilaterally. Nail Exam: Pt has thick disfigured discolored nails with subungual debris noted bilateral entire nail hallux through fifth toenails.  Her second toes have turned blacker due to usage of gel pads second toes both feet. Ulcer Exam: There is no evidence of ulcer or pre-ulcerative changes or infection. Orthopedic Exam: Muscle tone and strength are WNL. No limitations in general ROM. No crepitus or effusions noted. Severe HAV deformity  B/L with hammer toe second  B/L Skin: No Porokeratosis. No infection or ulcers  Diagnosis:  Onychomycosis, , Pain in right toe, pain in left toes  Treatment & Plan Procedures and Treatment: Consent by patient was obtained for treatment procedures. The patient understood the discussion of treatment and procedures well. All questions were answered thoroughly reviewed. Debridement of mycotic and hypertrophic toenails, 1 through 5 bilateral and clearing of subungual debris. No ulceration, no infection noted. Told to stop using the padding since her nails were done. Return Visit-Office Procedure: Patient instructed to return to the office for a follow up visit 3 months for continued evaluation and treatment.    Gardiner Barefoot DPM

## 2017-04-09 ENCOUNTER — Ambulatory Visit: Payer: Medicare Other | Admitting: Podiatry

## 2017-04-15 ENCOUNTER — Other Ambulatory Visit: Payer: Self-pay | Admitting: Internal Medicine

## 2017-04-15 DIAGNOSIS — R2231 Localized swelling, mass and lump, right upper limb: Secondary | ICD-10-CM

## 2017-04-15 DIAGNOSIS — M7989 Other specified soft tissue disorders: Secondary | ICD-10-CM

## 2017-04-19 ENCOUNTER — Ambulatory Visit
Admission: RE | Admit: 2017-04-19 | Discharge: 2017-04-19 | Disposition: A | Discharge status: Home / Self Care | Payer: Medicare Other | Source: Ambulatory Visit | Attending: Internal Medicine | Admitting: Internal Medicine

## 2017-04-19 ENCOUNTER — Ambulatory Visit: Payer: Medicare Other

## 2017-04-19 DIAGNOSIS — M7989 Other specified soft tissue disorders: Secondary | ICD-10-CM | POA: Insufficient documentation

## 2017-04-19 DIAGNOSIS — R2231 Localized swelling, mass and lump, right upper limb: Secondary | ICD-10-CM

## 2017-06-27 ENCOUNTER — Encounter: Payer: Self-pay | Admitting: Podiatry

## 2017-06-27 ENCOUNTER — Ambulatory Visit (INDEPENDENT_AMBULATORY_CARE_PROVIDER_SITE_OTHER): Payer: Medicare Other | Admitting: Podiatry

## 2017-06-27 DIAGNOSIS — B351 Tinea unguium: Secondary | ICD-10-CM | POA: Diagnosis not present

## 2017-06-27 DIAGNOSIS — M79609 Pain in unspecified limb: Secondary | ICD-10-CM

## 2017-06-27 DIAGNOSIS — M21619 Bunion of unspecified foot: Secondary | ICD-10-CM

## 2017-06-27 DIAGNOSIS — M2040 Other hammer toe(s) (acquired), unspecified foot: Secondary | ICD-10-CM

## 2017-06-27 NOTE — Progress Notes (Signed)
Complaint:  Visit Type: Patient returns to my office for continued preventative foot care services. Complaint: Patient states" my nails have grown long and thick and become painful to walk and wear shoes" She is concerned that the pads are turning the toes black. The patient presents for preventative foot care services. No changes to ROS  Podiatric Exam: Vascular: dorsalis pedis and posterior tibial pulses are palpable bilateral. Capillary return is immediate. Temperature gradient is WNL. Skin turgor WNL  Sensorium: Normal Semmes Weinstein monofilament test. Normal tactile sensation bilaterally. Nail Exam: Pt has thick disfigured discolored nails with subungual debris noted bilateral entire nail hallux through fifth toenails.  Ulcer Exam: There is no evidence of ulcer or pre-ulcerative changes or infection. Orthopedic Exam: Muscle tone and strength are WNL. No limitations in general ROM. No crepitus or effusions noted. Severe HAV deformity  B/L with hammer toe second  and third   right foot. Skin: No Porokeratosis. No infection or ulcers  Diagnosis:  Onychomycosis, , Pain in right toe, pain in left toes  Treatment & Plan Procedures and Treatment: Consent by patient was obtained for treatment procedures. The patient understood the discussion of treatment and procedures well. All questions were answered thoroughly reviewed. Debridement of mycotic and hypertrophic toenails, 1 through 5 bilateral and clearing of subungual debris. No ulceration, no infection noted.  Padding for hammer toes 2,3 right. Return Visit-Office Procedure: Patient instructed to return to the office for a follow up visit 3 months for continued evaluation and treatment.    Gardiner Barefoot DPM

## 2017-09-19 ENCOUNTER — Ambulatory Visit: Payer: Medicare Other | Admitting: Podiatry

## 2017-09-20 ENCOUNTER — Ambulatory Visit: Payer: Medicare Other | Admitting: Podiatry

## 2017-09-27 ENCOUNTER — Ambulatory Visit (INDEPENDENT_AMBULATORY_CARE_PROVIDER_SITE_OTHER): Payer: Medicare Other | Admitting: Podiatry

## 2017-09-27 DIAGNOSIS — B351 Tinea unguium: Secondary | ICD-10-CM | POA: Diagnosis not present

## 2017-09-27 DIAGNOSIS — M79609 Pain in unspecified limb: Secondary | ICD-10-CM

## 2017-09-30 NOTE — Progress Notes (Signed)
   SUBJECTIVE Patient presents to office today complaining of elongated, thickened nails. Pain while ambulating in shoes. Patient is unable to trim their own nails. She also reports hammertoes of the right foot that are becoming more painful. She is here for further evaluation and treatment.    Past Medical History:  Diagnosis Date  . Abdominal pain   . Arthritis   . Bowel habit changes    pressure  . Cancer ([place])   . Depression   . Fibromyalgia   . Heart disease 2012  . History of fainting spells of unknown cause   . Inflammatory polyps of colon ([place])   . Poor appetite   . Shortness of breath dyspnea   . Urinary urgency     OBJECTIVE General Patient is awake, alert, and oriented x 3 and in no acute distress. Derm Skin is dry and supple bilateral. Negative open lesions or macerations. Remaining integument unremarkable. Nails are tender, long, thickened and dystrophic with subungual debris, consistent with onychomycosis, 1-5 bilateral. No signs of infection noted. Vasc  DP and PT pedal pulses palpable bilaterally. Temperature gradient within normal limits.  Neuro Epicritic and protective threshold sensation diminished bilaterally.  Musculoskeletal Exam No symptomatic pedal deformities noted bilateral. Muscular strength within normal limits.  ASSESSMENT 1. Onychodystrophic nails 1-5 bilateral with hyperkeratosis of nails.  2. Onychomycosis of nail due to dermatophyte bilateral 3. Pain in foot bilateral  PLAN OF CARE 1. Patient evaluated today.  2. Instructed to maintain good pedal hygiene and foot care.  3. Mechanical debridement of nails 1-5 bilaterally performed using a nail nipper. Filed with dremel without incident.  4. Silicone toe pads dispensed.  5. Return to clinic in 3 mos.    Edrick Kins, DPM [facility]  Dr. Edrick Kins, [address]                                        [place], [place] [address]                Office [phone]  Fax [phone]

## 2017-12-05 ENCOUNTER — Emergency Department
Admission: EM | Admit: 2017-12-05 | Discharge: 2017-12-05 | Disposition: A | Discharge status: Home / Self Care | Payer: Medicare Other | Attending: Emergency Medicine | Admitting: Emergency Medicine

## 2017-12-05 ENCOUNTER — Encounter: Payer: Self-pay | Admitting: Emergency Medicine

## 2017-12-05 ENCOUNTER — Other Ambulatory Visit: Payer: Self-pay

## 2017-12-05 DIAGNOSIS — K5900 Constipation, unspecified: Secondary | ICD-10-CM | POA: Insufficient documentation

## 2017-12-05 DIAGNOSIS — Z5321 Procedure and treatment not carried out due to patient leaving prior to being seen by health care provider: Secondary | ICD-10-CM | POA: Diagnosis not present

## 2017-12-05 NOTE — ED Notes (Signed)
Pt came to desk and stated she could not wait any longer and was leaving.

## 2017-12-05 NOTE — ED Triage Notes (Signed)
Pt to ED via POV c/o constipation. Pt states that she had a CT scan last week that showed that she was impacted. Pt has been using OTC medications at home but it has not helped. Pt called her PCP who told her to come to the ED. Pt in NAD at this time.

## 2017-12-06 ENCOUNTER — Encounter: Payer: Self-pay | Admitting: Emergency Medicine

## 2017-12-06 ENCOUNTER — Emergency Department: Payer: Medicare Other

## 2017-12-06 ENCOUNTER — Emergency Department
Admission: EM | Admit: 2017-12-06 | Discharge: 2017-12-06 | Disposition: A | Discharge status: Home / Self Care | Payer: Medicare Other | Attending: Emergency Medicine | Admitting: Emergency Medicine

## 2017-12-06 ENCOUNTER — Telehealth: Payer: Self-pay | Admitting: Emergency Medicine

## 2017-12-06 DIAGNOSIS — Z859 Personal history of malignant neoplasm, unspecified: Secondary | ICD-10-CM | POA: Insufficient documentation

## 2017-12-06 DIAGNOSIS — K649 Unspecified hemorrhoids: Secondary | ICD-10-CM | POA: Insufficient documentation

## 2017-12-06 DIAGNOSIS — K5900 Constipation, unspecified: Secondary | ICD-10-CM | POA: Diagnosis not present

## 2017-12-06 DIAGNOSIS — Z79899 Other long term (current) drug therapy: Secondary | ICD-10-CM | POA: Insufficient documentation

## 2017-12-06 DIAGNOSIS — R109 Unspecified abdominal pain: Secondary | ICD-10-CM | POA: Diagnosis present

## 2017-12-06 LAB — COMPREHENSIVE METABOLIC PANEL
ALBUMIN: 3.7 g/dL (ref 3.5–5.0)
ALT: 13 U/L — AB (ref 14–54)
AST: 35 U/L (ref 15–41)
Alkaline Phosphatase: 76 U/L (ref 38–126)
Anion gap: 9 (ref 5–15)
BUN: 10 mg/dL (ref 6–20)
CHLORIDE: 105 mmol/L (ref 101–111)
CO2: 24 mmol/L (ref 22–32)
CREATININE: 0.68 mg/dL (ref 0.44–1.00)
Calcium: 9.2 mg/dL (ref 8.9–10.3)
GFR calc Af Amer: >60 mL/min (ref >60)
GLUCOSE: 90 mg/dL (ref 65–99)
POTASSIUM: 4.4 mmol/L (ref 3.5–5.1)
Sodium: 138 mmol/L (ref 135–145)
Total Bilirubin: 1.7 mg/dL — ABNORMAL HIGH (ref 0.3–1.2)
Total Protein: 6.6 g/dL (ref 6.5–8.1)

## 2017-12-06 LAB — CBC
HEMATOCRIT: 39.2 % (ref 35.0–47.0)
Hemoglobin: 13.3 g/dL (ref 12.0–16.0)
MCH: 29.6 pg (ref 26.0–34.0)
MCHC: 34 g/dL (ref 32.0–36.0)
MCV: 87.1 fL (ref 80.0–100.0)
PLATELETS: 291 10*3/uL (ref 150–440)
RBC: 4.51 MIL/uL (ref 3.80–5.20)
RDW: 13.1 % (ref 11.5–14.5)
WBC: 5.1 10*3/uL (ref 3.6–11.0)

## 2017-12-06 MED ORDER — POLYETHYLENE GLYCOL 3350 17 G PO PACK: 17.0000 g | PACK | Freq: Every day | ORAL | 0 refills | Status: DC

## 2017-12-06 MED ORDER — FLEET ENEMA 7-19 GM/118ML RE ENEM
1.0000 | ENEMA | Freq: Once | RECTAL | Status: AC
  Administered 2017-12-06: 1 via RECTAL

## 2017-12-06 NOTE — ED Triage Notes (Signed)
Pt returned to ED after being advised to go to the ED by her PCP yesterday. Pt came yesterday but did not want to continue to wait. Pt states she has abdominal pain and has been unable to have a complete bowel movement for approx 1 year.

## 2017-12-06 NOTE — ED Provider Notes (Signed)
[facility] Emergency Department Provider Note   ____________________________________________    I have reviewed the triage vital signs and the nursing notes.   HISTORY  Chief Complaint Abdominal Pain and Fecal Impaction     HPI Carmen Humphrey is a 84 y.o. female who presents with complaints of constipation.  Patient reports nearly 2 years of significant constipation.  Reportedly recently had a CT scan at her primary care's office and was told that she had a stool impaction and referred to the emergency department.  No nausea or vomiting.  No fevers or chills.  No current abdominal pain.  Patient  does complain of hemorrhoid pain as well   Past Medical History:  Diagnosis Date  . Abdominal pain   . Arthritis   . Bowel habit changes    pressure  . Cancer ([place])   . Depression   . Fibromyalgia   . Heart disease 2012  . History of fainting spells of unknown cause   . Inflammatory polyps of colon ([place])   . Poor appetite   . Shortness of breath dyspnea   . Urinary urgency     Patient Active Problem List   Diagnosis Date Noted  . Dystrophic nail 07/18/2015  . Acquired claw toe of right foot 07/18/2015  . Blood pressure elevated without history of HTN 03/22/2015  . Arthritis of hand, left 03/22/2015  . Cognitive and neurobehavioral dysfunction 03/22/2015  . Coronary atherosclerosis of native coronary artery 03/22/2015  . Vitamin D deficiency 03/22/2015    Past Surgical History:  Procedure Laterality Date  . BREAST SURGERY    . ECTOPIC PREGNANCY SURGERY    . HYSTEROSCOPY W/D&C N/A 07/23/2016   Procedure: DILATATION AND CURETTAGE;  Surgeon: Benjaman Kindler, MD;  Location: [facility] ORS;  Service: Gynecology;  Laterality: N/A;  . TUBAL LIGATION      Prior to Admission medications   Medication Sig Start Date End Date Taking? Authorizing Provider  Cholecalciferol (VITAMIN D3) 1000 units CAPS Take 1 capsule by mouth daily.    [provider]  Coenzyme Q10-Fish Oil-Vit E (CO-Q 10 OMEGA-3 FISH OIL) CAPS Take 2 capsules by mouth daily.    [provider]  docusate sodium (COLACE) 100 MG capsule Take 1 capsule (100 mg total) by mouth daily as needed for mild constipation. 07/23/16   Benjaman Kindler, MD  ENSURE [facility]) Take 1 Can by mouth every other day.    [provider]  Misc Natural Products (COLON CARE PO) Take 1 capsule by mouth daily.    [provider]  NON FORMULARY Apply 1 Dose topically daily. Blue stuff pain relief cream    [provider]  NON FORMULARY Take 3 drops by mouth daily. Vital joint solution    [provider]  Probiotic Product (PROBIOTIC & ACIDOPHILUS EX ST) CAPS Take 1 capsule by mouth daily as needed.    [provider]  psyllium (METAMUCIL) 58.6 % powder Take 1 packet by mouth daily as needed.    [provider]  Salicylic Acid 2 % CREA Apply topically.    [provider]     Allergies Ciprofloxacin; Fish allergy; and Fish-derived products  Family History  Problem Relation Age of Onset  . Heart disease Mother   . Hypertension Mother   . Heart disease Father   . Hypertension Father     Social History Social History   Tobacco Use  . Smoking status: Never Smoker  . Smokeless tobacco: Never Used  Substance Use Topics  . Alcohol use: No    Alcohol/week: 0.0 oz  . Drug use: No    Review of Systems  Constitutional: No fever/chills Eyes: No visual changes.  ENT: No sore throat. Cardiovascular: Denies chest pain. Respiratory: Denies cough Gastrointestinal: As above Genitourinary: Negative for dysuria. Musculoskeletal: Negative for back pain. Skin: Negative for rash. Neurological: Negative for headaches    ____________________________________________   PHYSICAL EXAM:  VITAL SIGNS: ED Triage Vitals  Enc Vitals Group     BP 12/06/17 1054 (!) 135/97     Pulse Rate 12/06/17 1054 91     Resp 12/06/17 1054 16      Temp 12/06/17 1054 98.4 F (36.9 C)     Temp Source 12/06/17 1054 Oral     SpO2 12/06/17 1054 98 %     Weight 12/06/17 1100 57.2 kg (126 lb)     Height 12/06/17 1100 1.676 m (5\' 6" )     Head Circumference --      Peak Flow --      Pain Score 12/06/17 1100 5     Pain Loc --      Pain Edu? --      Excl. in [place]? --     Constitutional: Alert and oriented. No acute distress. Pleasant and interactive Eyes: Conjunctivae are normal.    Neck:  Painless ROM Cardiovascular: Normal rate, regular rhythm.   Good peripheral circulation. Respiratory: Normal respiratory effort.  No retractions.  Gastrointestinal: Soft and nontender. No distention.  Rectal exam: Several inflamed hemorrhoids, no stool in rectum  Musculoskeletal:  Warm and well perfused Neurologic:  Normal speech and language. No gross focal neurologic deficits are appreciated.  Skin:  Skin is warm, dry and intact. No rash noted. Psychiatric: Mood and affect are normal. Speech and behavior are normal.  ____________________________________________   LABS (all labs ordered are listed, but only abnormal results are displayed)  Labs Reviewed  COMPREHENSIVE METABOLIC PANEL - Abnormal; Notable for the following components:      Result Value   ALT 13 (*)    Total Bilirubin 1.7 (*)    All other components within normal limits  CBC   ____________________________________________  EKG  None ____________________________________________  RADIOLOGY  KUB overall unremarkable ____________________________________________   PROCEDURES  Procedure(s) performed: No  Procedures   Critical Care performed: No ____________________________________________   INITIAL IMPRESSION / ASSESSMENT AND PLAN / ED COURSE  Pertinent labs & imaging results that were available during my care of the patient were reviewed by me and considered in my medical decision making (see chart for details).  Patient complains of constipation reportedly  had CT that described stool impaction however unable to palpate any stool in the rectum.  Her KUB does not show significant stool in the colon.  Attempting to reach PCP as unable to access CT scan  Discussed with patient's PCP.  Reportedly CT scan done over 2 weeks ago demonstrated fecal impaction no other acute findings.  She requests enema which we will perform    ____________________________________________   FINAL CLINICAL IMPRESSION(S) / ED DIAGNOSES  Final diagnoses:  Constipation, unspecified constipation type        Note:  This document was prepared using Dragon voice recognition software and may include unintentional dictation errors.    Lavonia Drafts, MD 12/06/17 1540

## 2017-12-06 NOTE — ED Notes (Signed)
X-ray at bedside

## 2017-12-06 NOTE — Discharge Instructions (Signed)

## 2017-12-06 NOTE — ED Notes (Addendum)
Dr. Corky Downs at bedside to perform disimpaction. No stool at rectum to remove.

## 2017-12-06 NOTE — Telephone Encounter (Signed)
Called patient due to lwot to inquire about condition and follow up plans. Patient has just arrived back to ED here.

## 2017-12-31 ENCOUNTER — Ambulatory Visit: Payer: Medicare Other | Admitting: Podiatry

## 2018-06-11 IMAGING — MR MR HEAD W/O CM
9 of 10 series · 40 of 48 positions shown · non-contrast
Comparison: None.

CLINICAL DATA: Forgetfulness and hallucinations.  Memory loss.

EXAM:
MRI HEAD WITHOUT CONTRAST
TECHNIQUE: Multiplanar, multiecho pulse sequences of the brain and surrounding
structures were obtained without intravenous contrast.

[Series 2: T1 · sagittal · 5.0mm · 0.45mm/px · 3 of 29 slices shown]
[im 1/29]
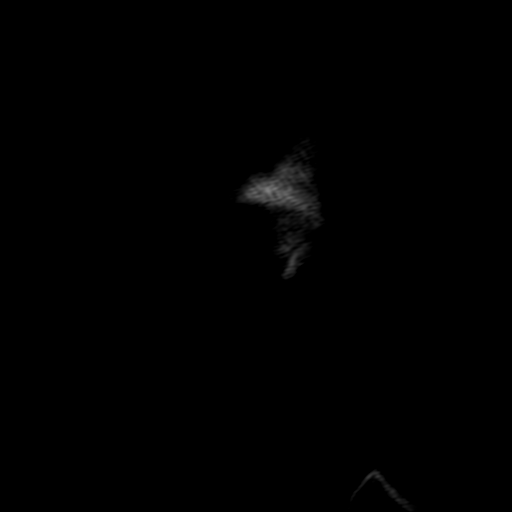
[im 15/29]
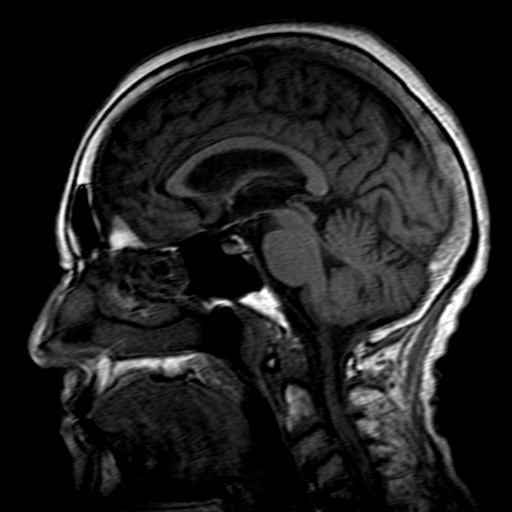
[im 29/29]
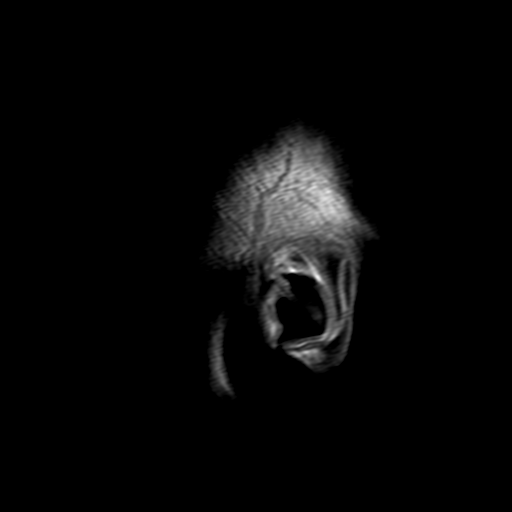

[Series 4: DWI · axial · 4.0mm · 0.94mm/px · z∈[-77,+88]mm · 5 of 43 slices shown (1 of 4)]
[im 1/43]
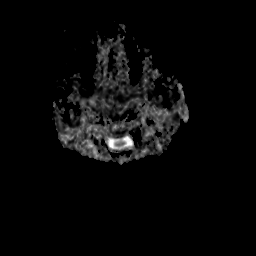
[im 11/43]
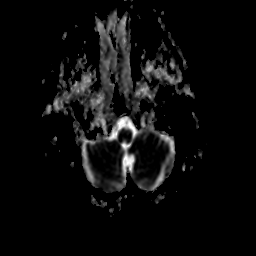
[im 22/43]
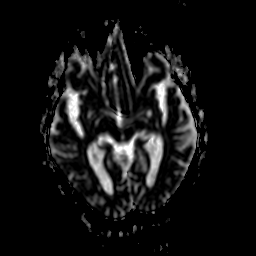
[im 32/43]
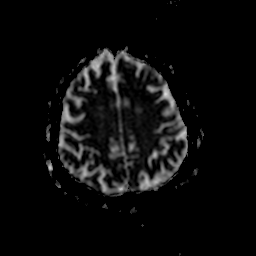
[im 43/43]
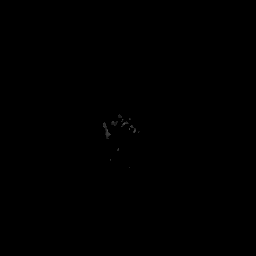

[Series 5: DWI · axial · 4.0mm · 0.94mm/px · z∈[-76,+93]mm · 6 of 44 slices shown (2 of 4)]
[im 1/44]
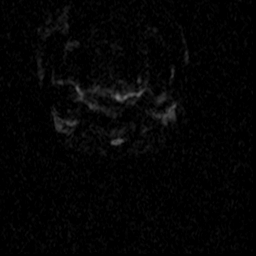
[im 9/44]
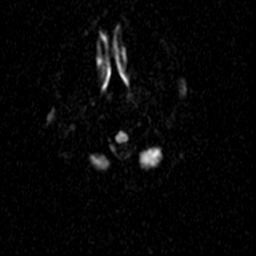
[im 18/44]
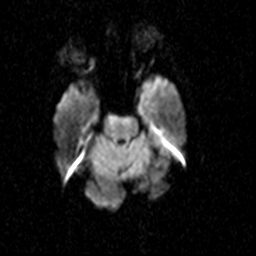
[im 26/44]
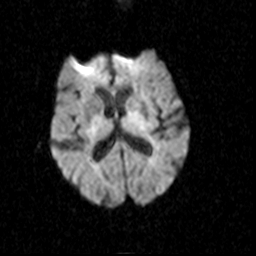
[im 35/44]
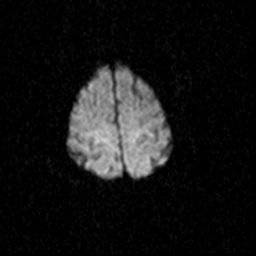
[im 44/44]
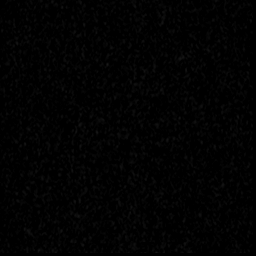

[Series 7: DWI · coronal · 5.0mm · 1.80mm/px · 5 of 39 slices shown (3 of 4)]
[im 1/39]
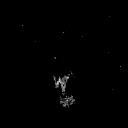
[im 10/39]
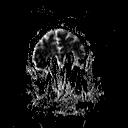
[im 20/39]
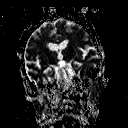
[im 29/39]
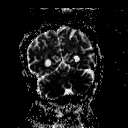
[im 39/39]
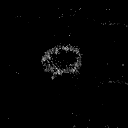

[Series 8: T2 · axial · 5.0mm · 0.45mm/px · z∈[-70,+96]mm · 4 of 27 slices shown (1 of 3)]
[im 1/27]
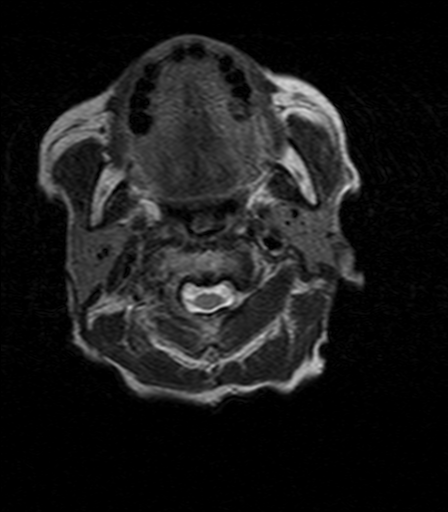
[im 9/27]
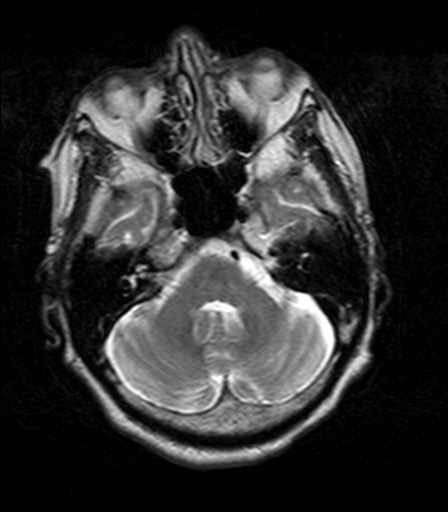
[im 18/27]
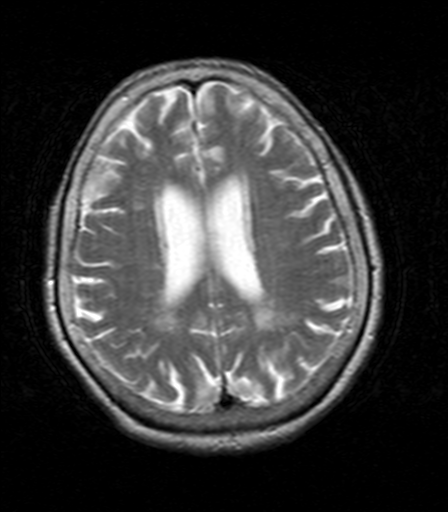
[im 27/27]
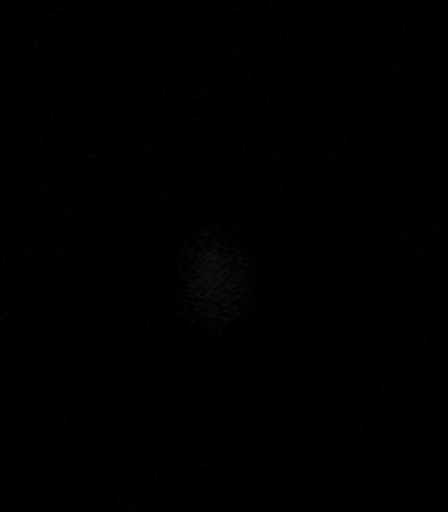

[Series 9: FLAIR · axial · 5.0mm · 0.90mm/px · z∈[-67,+99]mm · 4 of 27 slices shown]
[im 1/27]
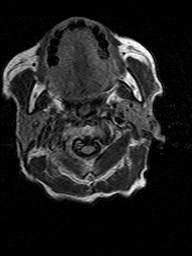
[im 9/27]
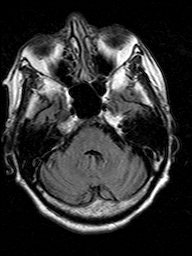
[im 18/27]
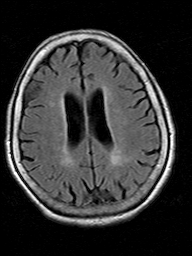
[im 27/27]
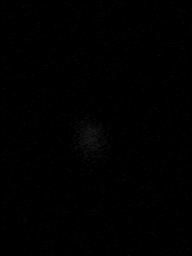

[Series 10: T2 · axial · 5.0mm · 0.45mm/px · z∈[-70,+96]mm · 4 of 27 slices shown (2 of 3)]
[im 1/27]
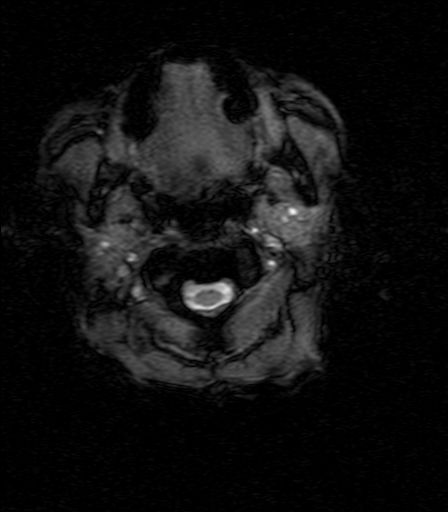
[im 9/27]
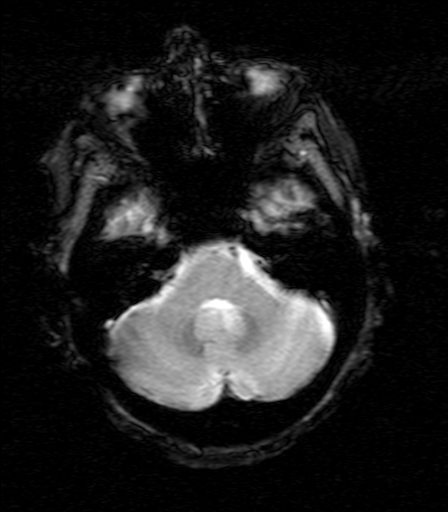
[im 18/27]
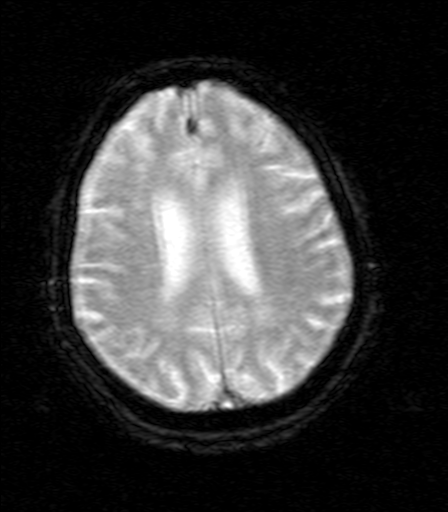
[im 27/27]
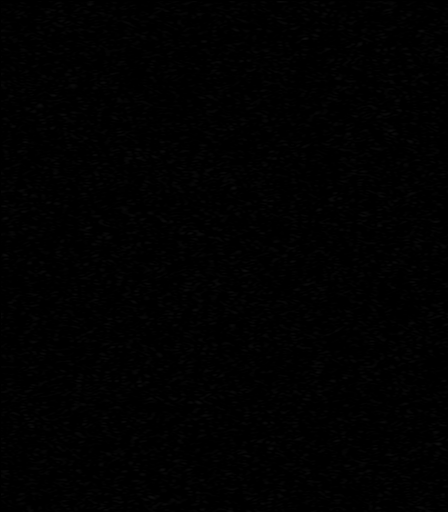

[Series 12: T2 · coronal · 5.0mm · 0.45mm/px · 4 of 29 slices shown (3 of 3)]
[im 1/29]
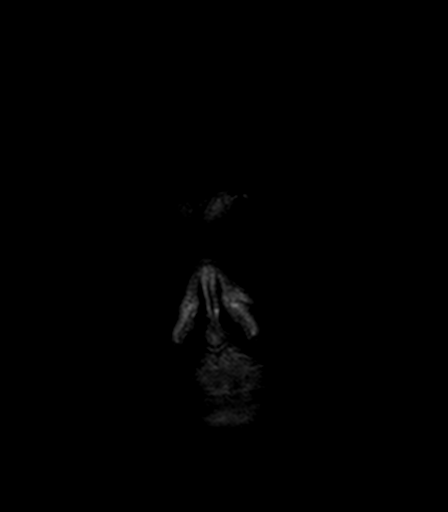
[im 10/29]
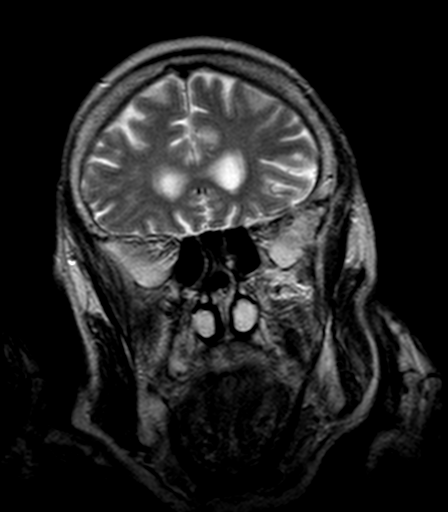
[im 19/29]
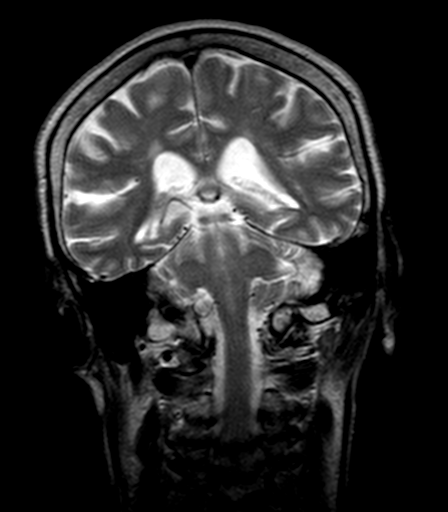
[im 29/29]
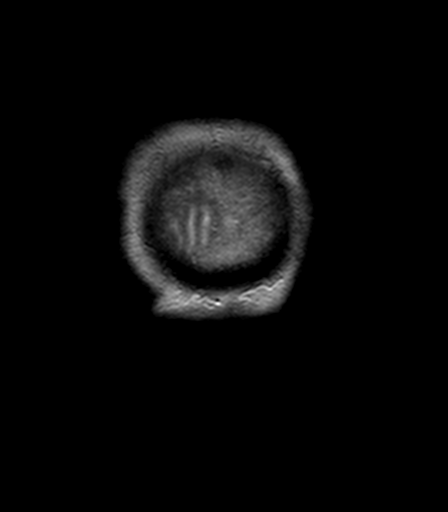

[Series 13: DWI · coronal · 5.0mm · 1.80mm/px · 5 of 38 slices shown (4 of 4)]
[im 1/38]
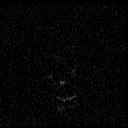
[im 10/38]
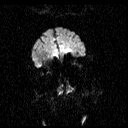
[im 19/38]
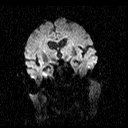
[im 28/38]
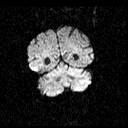
[im 38/38]
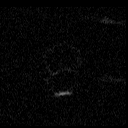

[40 of 48 positions shown; findings below may reference images not displayed]

FINDINGS: Brain: Diffusion-weighted images demonstrate no evidence for acute
or subacute infarction. A remote lacunar infarct is present in the
right occipital lobe. There is moderate diffuse periventricular T2
change bilaterally. Remote white matter infarcts are present within
the anterior limb of the internal capsule bilaterally. Dilated
perivascular spaces are present within the lentiform nucleus
bilaterally.

No acute cortical infarct is present. Mild atrophy is within normal
limits for age. The internal auditory canals are within normal
limits. The brainstem and cerebellum are normal.

Vascular:  Flow is present in the major intracranial arteries.

Skull and upper cervical spine: The skullbase is within normal
limits. The craniocervical junction is unremarkable. Multilevel
degenerative changes are present in the cervical spine. Myelomalacia
or syrinx is suggested at the C3-4 level. Midline intracranial
sagittal structures are within normal limits. Marrow signal is
normal at the skullbase.

Sinuses/Orbits: Mucosal thickening is evident along the floor of the
maxillary sinuses bilaterally, left greater than right. Paranasal
sinuses are otherwise clear. There is some fluid in the left mastoid
air cells. The mastoid air cells are otherwise clear.

The globes and orbits are within normal limits.
IMPRESSION: 1. Go moderate diffuse white matter disease reflects the sequela of
chronic microvascular ischemia.
2. Remote ischemic changes within the anterior limb of the internal
capsule bilaterally.
3. No acute intracranial abnormality.
4. Minimal sinus disease and small left mastoid effusion. No
obstructing nasopharyngeal lesion is present

## 2018-09-12 ENCOUNTER — Encounter: Payer: Self-pay | Admitting: Podiatry

## 2018-09-12 ENCOUNTER — Ambulatory Visit (INDEPENDENT_AMBULATORY_CARE_PROVIDER_SITE_OTHER): Payer: Medicare Other | Admitting: Podiatry

## 2018-09-12 DIAGNOSIS — R9389 Abnormal findings on diagnostic imaging of other specified body structures: Secondary | ICD-10-CM

## 2018-09-12 DIAGNOSIS — M79609 Pain in unspecified limb: Secondary | ICD-10-CM

## 2018-09-12 DIAGNOSIS — N83201 Unspecified ovarian cyst, right side: Secondary | ICD-10-CM

## 2018-09-12 DIAGNOSIS — R102 Pelvic and perineal pain: Secondary | ICD-10-CM | POA: Insufficient documentation

## 2018-09-12 DIAGNOSIS — B351 Tinea unguium: Secondary | ICD-10-CM

## 2018-09-12 HISTORY — DX: Unspecified ovarian cyst, right side: N83.201

## 2018-09-12 HISTORY — DX: Abnormal findings on diagnostic imaging of other specified body structures: R93.89

## 2018-09-12 HISTORY — DX: Pelvic and perineal pain: R10.2

## 2018-09-12 NOTE — Progress Notes (Signed)
   SUBJECTIVE Patient presents to office today complaining of elongated, thickened nails that cause pain while ambulating in shoes. She is unable to trim her own nails. Patient is here for further evaluation and treatment.  Past Medical History:  Diagnosis Date  . Abdominal pain   . Arthritis   . Bowel habit changes    pressure  . Cancer ([place])   . Depression   . Fibromyalgia   . Heart disease 2012  . History of fainting spells of unknown cause   . Inflammatory polyps of colon ([place])   . Poor appetite   . Shortness of breath dyspnea   . Urinary urgency     OBJECTIVE General Patient is awake, alert, and oriented x 3 and in no acute distress. Derm Skin is dry and supple bilateral. Negative open lesions or macerations. Remaining integument unremarkable. Nails are tender, long, thickened and dystrophic with subungual debris, consistent with onychomycosis, 1-5 bilateral. No signs of infection noted. Vasc  DP and PT pedal pulses palpable bilaterally. Temperature gradient within normal limits.  Neuro Epicritic and protective threshold sensation grossly intact bilaterally.  Musculoskeletal Exam No symptomatic pedal deformities noted bilateral. Muscular strength within normal limits.  ASSESSMENT 1. Onychodystrophic nails 1-5 bilateral with hyperkeratosis of nails.  2. Onychomycosis of nail due to dermatophyte bilateral 3. Pain in foot bilateral  PLAN OF CARE 1. Patient evaluated today.  2. Instructed to maintain good pedal hygiene and foot care.  3. Mechanical debridement of nails 1-5 bilaterally performed using a nail nipper. Filed with dremel without incident.  4. Return to clinic in 3 mos.    Edrick Kins, DPM [facility]  Dr. Edrick Kins, [address]                                        [place], [place] [address]                Office [phone]  Fax [phone]

## 2018-10-07 DIAGNOSIS — R10817 Generalized abdominal tenderness: Secondary | ICD-10-CM | POA: Insufficient documentation

## 2018-10-07 HISTORY — DX: Generalized abdominal tenderness: R10.817

## 2018-10-08 ENCOUNTER — Other Ambulatory Visit: Payer: Self-pay | Admitting: Nurse Practitioner

## 2018-10-08 DIAGNOSIS — R10817 Generalized abdominal tenderness: Secondary | ICD-10-CM

## 2018-10-29 ENCOUNTER — Ambulatory Visit
Admission: RE | Admit: 2018-10-29 | Discharge: 2018-10-29 | Disposition: A | Discharge status: Home / Self Care | Payer: Medicare Other | Source: Ambulatory Visit | Attending: Nurse Practitioner | Admitting: Nurse Practitioner

## 2018-10-29 DIAGNOSIS — R10817 Generalized abdominal tenderness: Secondary | ICD-10-CM

## 2018-12-11 ENCOUNTER — Ambulatory Visit: Payer: Medicare Other | Admitting: Podiatry

## 2018-12-12 ENCOUNTER — Other Ambulatory Visit: Payer: Self-pay | Admitting: Podiatry

## 2018-12-12 ENCOUNTER — Encounter: Payer: Self-pay | Admitting: Podiatry

## 2018-12-12 ENCOUNTER — Ambulatory Visit (INDEPENDENT_AMBULATORY_CARE_PROVIDER_SITE_OTHER): Payer: Medicare Other

## 2018-12-12 ENCOUNTER — Ambulatory Visit: Payer: Medicare Other | Admitting: Podiatry

## 2018-12-12 ENCOUNTER — Ambulatory Visit (INDEPENDENT_AMBULATORY_CARE_PROVIDER_SITE_OTHER): Payer: Medicare Other | Admitting: Podiatry

## 2018-12-12 DIAGNOSIS — M79672 Pain in left foot: Principal | ICD-10-CM

## 2018-12-12 DIAGNOSIS — B351 Tinea unguium: Secondary | ICD-10-CM

## 2018-12-12 DIAGNOSIS — M79671 Pain in right foot: Secondary | ICD-10-CM

## 2018-12-12 DIAGNOSIS — M779 Enthesopathy, unspecified: Secondary | ICD-10-CM

## 2018-12-12 DIAGNOSIS — M778 Other enthesopathies, not elsewhere classified: Secondary | ICD-10-CM

## 2018-12-12 DIAGNOSIS — M7750 Other enthesopathy of unspecified foot: Secondary | ICD-10-CM

## 2018-12-14 NOTE — Progress Notes (Signed)
   SUBJECTIVE Patient presents to office today complaining of elongated, thickened nails that cause pain while ambulating in shoes. She is unable to trim her own nails. Patient is here for further evaluation and treatment.  Past Medical History:  Diagnosis Date  . Abdominal pain   . Arthritis   . Bowel habit changes    pressure  . Cancer ([place])   . Depression   . Fibromyalgia   . Heart disease 2012  . History of fainting spells of unknown cause   . Inflammatory polyps of colon ([place])   . Poor appetite   . Shortness of breath dyspnea   . Urinary urgency     OBJECTIVE General Patient is awake, alert, and oriented x 3 and in no acute distress. Derm Skin is dry and supple bilateral. Negative open lesions or macerations. Remaining integument unremarkable. Nails are tender, long, thickened and dystrophic with subungual debris, consistent with onychomycosis, 1-5 bilateral. No signs of infection noted. Vasc  DP and PT pedal pulses palpable bilaterally. Temperature gradient within normal limits.  Neuro Epicritic and protective threshold sensation grossly intact bilaterally.  Musculoskeletal Exam No symptomatic pedal deformities noted bilateral. Muscular strength within normal limits.  Radiographic Exam: DJD throughout the pedal joints of the feet bilaterally.   ASSESSMENT 1. Onychodystrophic nails 1-5 bilateral with hyperkeratosis of nails.  2. Onychomycosis of nail due to dermatophyte bilateral 3. DJD bilateral feet and toes  PLAN OF CARE 1. Patient evaluated today. X-Rays reviewed.  2. Instructed to maintain good pedal hygiene and foot care.  3. Mechanical debridement of nails 1-5 bilaterally performed using a nail nipper. Filed with dremel without incident.  4. Silicone toe crest pads dispensed.  5. Return to clinic in 3 mos.    Edrick Kins, DPM [facility]  Dr. Edrick Kins, [address]                                        [place], [place]  [address]                Office [phone]  Fax [phone]

## 2019-03-06 ENCOUNTER — Ambulatory Visit: Payer: Medicare Other | Admitting: Podiatry

## 2019-04-17 ENCOUNTER — Other Ambulatory Visit: Payer: Self-pay

## 2019-04-17 ENCOUNTER — Ambulatory Visit (INDEPENDENT_AMBULATORY_CARE_PROVIDER_SITE_OTHER): Payer: Medicare Other | Admitting: Podiatry

## 2019-04-17 ENCOUNTER — Encounter: Payer: Self-pay | Admitting: Podiatry

## 2019-04-17 VITALS — BP 175/85 | HR 74 | Temp 97.1°F | Resp 16

## 2019-04-17 DIAGNOSIS — M79676 Pain in unspecified toe(s): Secondary | ICD-10-CM | POA: Diagnosis not present

## 2019-04-17 DIAGNOSIS — B351 Tinea unguium: Secondary | ICD-10-CM | POA: Diagnosis not present

## 2019-04-17 DIAGNOSIS — M2040 Other hammer toe(s) (acquired), unspecified foot: Secondary | ICD-10-CM

## 2019-04-21 NOTE — Progress Notes (Signed)
   SUBJECTIVE Patient presents to office today complaining of elongated, thickened nails that cause pain while ambulating in shoes. She is unable to trim her own nails. Patient is here for further evaluation and treatment.  Past Medical History:  Diagnosis Date  . Abdominal pain   . Arthritis   . Bowel habit changes    pressure  . Cancer ([place])   . Depression   . Fibromyalgia   . Heart disease 2012  . History of fainting spells of unknown cause   . Inflammatory polyps of colon ([place])   . Poor appetite   . Shortness of breath dyspnea   . Urinary urgency     OBJECTIVE General Patient is awake, alert, and oriented x 3 and in no acute distress. Derm Skin is dry and supple bilateral. Negative open lesions or macerations. Remaining integument unremarkable. Nails are tender, long, thickened and dystrophic with subungual debris, consistent with onychomycosis, 1-5 bilateral. No signs of infection noted. Vasc  DP and PT pedal pulses palpable bilaterally. Temperature gradient within normal limits.  Neuro Epicritic and protective threshold sensation grossly intact bilaterally.  Musculoskeletal Exam No symptomatic pedal deformities noted bilateral. Muscular strength within normal limits.  ASSESSMENT 1. Onychodystrophic nails 1-5 bilateral with hyperkeratosis of nails.  2. Onychomycosis of nail due to dermatophyte bilateral 3. Pain in foot bilateral  PLAN OF CARE 1. Patient evaluated today.  2. Instructed to maintain good pedal hygiene and foot care.  3. Mechanical debridement of nails 1-5 bilaterally performed using a nail nipper. Filed with dremel without incident.  4. Continue using silicone toe crest pads.  5. Return to clinic in 3 mos.   Daughter's husband recently had a stroke and is in rehab.    Edrick Kins, DPM [facility]  Dr. Edrick Kins, [address]                                        [place], [place] [address]                Office [phone]  Fax [phone]

## 2019-07-17 ENCOUNTER — Ambulatory Visit (INDEPENDENT_AMBULATORY_CARE_PROVIDER_SITE_OTHER): Payer: Medicare Other | Admitting: Podiatry

## 2019-07-17 ENCOUNTER — Other Ambulatory Visit: Payer: Self-pay

## 2019-07-17 ENCOUNTER — Encounter: Payer: Self-pay | Admitting: Podiatry

## 2019-07-17 VITALS — Temp 97.7°F

## 2019-07-17 DIAGNOSIS — B351 Tinea unguium: Secondary | ICD-10-CM | POA: Diagnosis not present

## 2019-07-17 DIAGNOSIS — M79609 Pain in unspecified limb: Secondary | ICD-10-CM | POA: Diagnosis not present

## 2019-07-19 NOTE — Progress Notes (Signed)
   SUBJECTIVE Patient presents to office today complaining of elongated, thickened nails that cause pain while ambulating in shoes. She is unable to trim her own nails. Patient is here for further evaluation and treatment.  Past Medical History:  Diagnosis Date  . Abdominal pain   . Arthritis   . Bowel habit changes    pressure  . Cancer ([place])   . Depression   . Fibromyalgia   . Heart disease 2012  . History of fainting spells of unknown cause   . Inflammatory polyps of colon ([place])   . Poor appetite   . Shortness of breath dyspnea   . Urinary urgency     OBJECTIVE General Patient is awake, alert, and oriented x 3 and in no acute distress. Derm Skin is dry and supple bilateral. Negative open lesions or macerations. Remaining integument unremarkable. Nails are tender, long, thickened and dystrophic with subungual debris, consistent with onychomycosis, 1-5 bilateral. No signs of infection noted. Vasc  DP and PT pedal pulses palpable bilaterally. Temperature gradient within normal limits.  Neuro Epicritic and protective threshold sensation grossly intact bilaterally.  Musculoskeletal Exam No symptomatic pedal deformities noted bilateral. Muscular strength within normal limits.  ASSESSMENT 1. Onychodystrophic nails 1-5 bilateral with hyperkeratosis of nails.  2. Onychomycosis of nail due to dermatophyte bilateral 3. Pain in foot bilateral  PLAN OF CARE 1. Patient evaluated today.  2. Instructed to maintain good pedal hygiene and foot care.  3. Mechanical debridement of nails 1-5 bilaterally performed using a nail nipper. Filed with dremel without incident.  4. Return to clinic in 3 mos.    Edrick Kins, DPM [facility]  Dr. Edrick Kins, [place]                                        [place], [place] [address]                Office [phone]  Fax [phone]

## 2019-10-16 ENCOUNTER — Ambulatory Visit: Payer: Medicare Other | Admitting: Podiatry

## 2019-11-30 ENCOUNTER — Encounter: Payer: Self-pay | Admitting: Podiatry

## 2019-11-30 ENCOUNTER — Ambulatory Visit (INDEPENDENT_AMBULATORY_CARE_PROVIDER_SITE_OTHER): Payer: Medicare Other | Admitting: Podiatry

## 2019-11-30 ENCOUNTER — Emergency Department: Payer: Medicare Other

## 2019-11-30 ENCOUNTER — Encounter: Payer: Self-pay | Admitting: Emergency Medicine

## 2019-11-30 ENCOUNTER — Other Ambulatory Visit: Payer: Self-pay

## 2019-11-30 ENCOUNTER — Emergency Department
Admission: EM | Admit: 2019-11-30 | Discharge: 2019-12-01 | Disposition: A | Discharge status: Home / Self Care | Payer: Medicare Other | Attending: Emergency Medicine | Admitting: Emergency Medicine

## 2019-11-30 DIAGNOSIS — Z79899 Other long term (current) drug therapy: Secondary | ICD-10-CM | POA: Diagnosis not present

## 2019-11-30 DIAGNOSIS — E860 Dehydration: Secondary | ICD-10-CM | POA: Diagnosis not present

## 2019-11-30 DIAGNOSIS — L03031 Cellulitis of right toe: Secondary | ICD-10-CM | POA: Diagnosis not present

## 2019-11-30 DIAGNOSIS — B351 Tinea unguium: Secondary | ICD-10-CM

## 2019-11-30 DIAGNOSIS — R1110 Vomiting, unspecified: Secondary | ICD-10-CM | POA: Diagnosis present

## 2019-11-30 DIAGNOSIS — R55 Syncope and collapse: Secondary | ICD-10-CM | POA: Diagnosis not present

## 2019-11-30 DIAGNOSIS — R112 Nausea with vomiting, unspecified: Secondary | ICD-10-CM

## 2019-11-30 DIAGNOSIS — N390 Urinary tract infection, site not specified: Secondary | ICD-10-CM | POA: Diagnosis not present

## 2019-11-30 DIAGNOSIS — M79676 Pain in unspecified toe(s): Secondary | ICD-10-CM | POA: Diagnosis not present

## 2019-11-30 DIAGNOSIS — M79609 Pain in unspecified limb: Secondary | ICD-10-CM

## 2019-11-30 LAB — COMPREHENSIVE METABOLIC PANEL
ALT: 21 U/L (ref 0–44)
AST: 27 U/L (ref 15–41)
Albumin: 3.7 g/dL (ref 3.5–5.0)
Alkaline Phosphatase: 79 U/L (ref 38–126)
Anion gap: 11 (ref 5–15)
BUN: 10 mg/dL (ref 8–23)
CO2: 21 mmol/L — ABNORMAL LOW (ref 22–32)
Calcium: 9 mg/dL (ref 8.9–10.3)
Chloride: 102 mmol/L (ref 98–111)
Creatinine, Ser: 0.66 mg/dL (ref 0.44–1.00)
GFR calc Af Amer: >60 mL/min (ref >60)
GFR calc non Af Amer: >60 mL/min (ref >60)
Glucose, Bld: 146 mg/dL — ABNORMAL HIGH (ref 70–99)
Potassium: 3.7 mmol/L (ref 3.5–5.1)
Sodium: 134 mmol/L — ABNORMAL LOW (ref 135–145)
Total Bilirubin: 0.8 mg/dL (ref 0.3–1.2)
Total Protein: 6.9 g/dL (ref 6.5–8.1)

## 2019-11-30 LAB — CBC WITH DIFFERENTIAL/PLATELET
Abs Immature Granulocytes: 0.03 10*3/uL (ref 0.00–0.07)
Basophils Absolute: 0 10*3/uL (ref 0.0–0.1)
Basophils Relative: 0 %
Eosinophils Absolute: 0 10*3/uL (ref 0.0–0.5)
Eosinophils Relative: 0 %
HCT: 37.5 % (ref 36.0–46.0)
Hemoglobin: 12.7 g/dL (ref 12.0–15.0)
Immature Granulocytes: 1 %
Lymphocytes Relative: 24 %
Lymphs Abs: 1.4 10*3/uL (ref 0.7–4.0)
MCH: 29.2 pg (ref 26.0–34.0)
MCHC: 33.9 g/dL (ref 30.0–36.0)
MCV: 86.2 fL (ref 80.0–100.0)
Monocytes Absolute: 0.4 10*3/uL (ref 0.1–1.0)
Monocytes Relative: 7 %
Neutro Abs: 3.8 10*3/uL (ref 1.7–7.7)
Neutrophils Relative %: 68 %
Platelets: 297 10*3/uL (ref 150–400)
RBC: 4.35 MIL/uL (ref 3.87–5.11)
RDW: 12.4 % (ref 11.5–15.5)
WBC: 5.6 10*3/uL (ref 4.0–10.5)
nRBC: 0 % (ref 0.0–0.2)

## 2019-11-30 LAB — TROPONIN I (HIGH SENSITIVITY)
Troponin I (High Sensitivity): 3 ng/L (ref <18)
Troponin I (High Sensitivity): 4 ng/L (ref <18)

## 2019-11-30 MED ORDER — ONDANSETRON 4 MG PO TBDP: 4.0000 mg | ORAL_TABLET | Freq: Three times a day (TID) | ORAL | 0 refills | Status: DC | PRN

## 2019-11-30 MED ORDER — CEPHALEXIN 500 MG PO CAPS: 500.0000 mg | ORAL_CAPSULE | Freq: Two times a day (BID) | ORAL | 0 refills | Status: DC

## 2019-11-30 NOTE — Patient Instructions (Signed)

## 2019-11-30 NOTE — Progress Notes (Signed)
Complaint:  Visit Type: Patient returns to my office for continued preventative foot care services. Complaint: Patient states" my nails have grown long and thick and become painful to walk and wear shoes" Patient has a sore big toe on the right foot that may be infected.  She says she has developed a hard red not noted at the base of the outside border right big toe.  She says it was very painful initially but now it has started to be less painful.. The patient presents for preventative foot care services.and evaluation of hard knot right big toe.  She presents to the office with her daughter.  Podiatric Exam: Vascular: dorsalis pedis and posterior tibial pulses are palpable bilateral. Capillary return is immediate. Temperature gradient is WNL. Skin turgor WNL  Sensorium: Normal Semmes Weinstein monofilament test. Normal tactile sensation bilaterally. Nail Exam: Pt has thick disfigured discolored nails with subungual debris noted bilateral entire nail hallux through fifth toenails.  Paronychia/granuloma base right hallux laterally. Ulcer Exam: There is no evidence of ulcer or pre-ulcerative changes or infection. Orthopedic Exam: Muscle tone and strength are WNL. No limitations in general ROM. No crepitus or effusions noted. Hammer toes 2,3,4  B/L. Bony prominences are unremarkable. Skin: No Porokeratosis. No infection or ulcers  Diagnosis:  Onychomycosis, , Pain in right toe, pain in left toes  Granuloma/Paronychia lateral border right hallux.  Treatment & Plan Procedures and Treatment: Consent by patient was obtained for treatment procedures.   Debridement of mycotic and hypertrophic toenails, 1 through 5 bilateral and clearing of subungual debris. No ulceration, no infection noted. Treatment options and alternatives discussed.  Recommended an incision and drainage and patient agreed.  Right hallux  was prepped with alcohol and a 3cc. of  2% lidocaine plain was administered in a digital block  fashion.  The toe was then prepped with betadine solution .  The offending nail border was then excised and all necrotic tissue was resected.  The area was then cleansed  and antibiotic ointment and a dry sterile dressing was applied.  The patient was dispensed instructions for aftercare. Prescribe cephalexin 500 mg  RTC 10 days. Return Visit-Office Procedure: Patient instructed to return to the office for a follow up visit 3 months for preventative foot care services.    Gardiner Barefoot DPM

## 2019-11-30 NOTE — Discharge Instructions (Addendum)
Your lab work today did show mild dehydration, as well as some inflammation in your urine.  We've given you an antibiotic for your urine, which should treat any infection.  For your dehydration, we've prescribed an anti-nausea medication (Zofran). Drink plenty of fluids, at least 6-8 glasses per day, for the next several days.

## 2019-11-30 NOTE — ED Triage Notes (Signed)
Pt here for syncope today at home. Has had multiple episodes of vomiting.  4 mg zofran given by EMS.  Pt dry heaving in ED on arrival.  No diarrhea. Only pain is chronic pain; pt has fibromyalgia.  Unlabored but has tachypnea.  VSS.  CBG 171 with EMS.  No urinary sx.

## 2019-11-30 NOTE — ED Provider Notes (Signed)
[facility] Emergency Department Provider Note  ____________________________________________  Time seen: Approximately 10:26 PM  I have reviewed the triage vital signs and the nursing notes.   HISTORY  Chief Complaint Emesis and Loss of Consciousness    HPI Carmen Humphrey is a 86 y.o. female who presents the emergency department for evaluation of emesis, syncope, slight increase in her abdominal pain.  Patient states that she has constant pain in her shoulders, muscles and belly.  She states that she has a long history of constipation but takes Metamucil, fruits, and prune juice to keep her "regular."  Patient states that today she was vomiting this morning, reportedly passed out.  She states that she does have a history of passing out but they have "never been able to find out why."  Patient states that with the vomiting and passing out her daughter wanted her "checked out."  Patient denies any increases in her back, chest, musculoskeletal pain.  She does have reportedly slightly worsened abdominal pain.  Patient states that after this morning she has not thrown up anymore.  She denies any urinary symptoms.  Patient states that she feels "okay" at this time.  No medications for these complaints prior to arrival.    Patient has a history of chronic abdominal pain, arthritis, fibromyalgia, syncopal episodes, urinary urgency, ovarian cyst.        Past Medical History:  Diagnosis Date  . Abdominal pain   . Arthritis   . Bowel habit changes    pressure  . Cancer ([place])   . Depression   . Fibromyalgia   . Heart disease 2012  . History of fainting spells of unknown cause   . Inflammatory polyps of colon ([place])   . Poor appetite   . Shortness of breath dyspnea   . Urinary urgency     Patient Active Problem List   Diagnosis Date Noted  . Paronychia of great toe, right 11/30/2019  . Pelvic pain in female 09/12/2018  . Right ovarian cyst 09/12/2018  .  Thickened endometrium 09/12/2018  . 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 History of gastrointestinal disorder 08/12/2015  . 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 Dystrophic nail 07/18/2015  . Acquired claw toe of right foot 07/18/2015  . Other deformities of toe(s) (acquired), right foot 07/18/2015  . Constipation 04/27/2015  . Chest pain with low risk for cardiac etiology 03/25/2015  . SOB (shortness of breath) on exertion 03/25/2015  . Blood pressure elevated without history of HTN 03/22/2015  . Arthritis of hand, left 03/22/2015  . Cognitive and neurobehavioral dysfunction 03/22/2015  . Coronary atherosclerosis of native coronary artery 03/22/2015  . Vitamin D deficiency 03/22/2015  . 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 Osteoarthritis 03/22/2015    Past Surgical History:  Procedure Laterality Date  . BREAST SURGERY    . ECTOPIC PREGNANCY SURGERY    . HYSTEROSCOPY WITH D & C N/A 07/23/2016   Procedure: DILATATION AND CURETTAGE;  Surgeon: Benjaman Kindler, MD;  Location: [facility] ORS;  Service: Gynecology;  Laterality: N/A;  . TUBAL LIGATION      Prior to Admission medications   Medication Sig Start Date End Date Taking? Authorizing Provider  cephALEXin (KEFLEX) 500 MG capsule Take 1 capsule (500 mg total) by mouth 2 (two) times daily. 11/30/19   Gardiner Barefoot, DPM  Cholecalciferol (VITAMIN D3) 1000 units CAPS Take 1 capsule by mouth daily.    [provider]  Coenzyme Q10-Fish Oil-Vit E (CO-Q 10 OMEGA-3  FISH OIL) CAPS Take 2 capsules by mouth daily.    [provider]  docusate sodium (COLACE) 100 MG capsule Take 1 capsule (100 mg total) by mouth daily as needed for mild constipation. 07/23/16   Benjaman Kindler, MD  ENSURE [facility]) Take 1 Can by mouth every other day.    [provider]  furosemide (LASIX) 20 MG tablet Take 20 mg by mouth daily. 09/04/19   [provider]  Misc Natural Products (COLON  CARE PO) Take 1 capsule by mouth daily.    [provider]  NON FORMULARY Apply 1 Dose topically daily. Blue stuff pain relief cream    [provider]  NON FORMULARY Take 3 drops by mouth daily. Vital joint solution    [provider]  ondansetron (ZOFRAN-ODT) 4 MG disintegrating tablet Take 1 tablet (4 mg total) by mouth every 8 (eight) hours as needed for nausea or vomiting. 11/30/19   Lilee Aldea, Charline Bills, PA-C  polyethylene glycol (MIRALAX / GLYCOLAX) packet Take 17 g by mouth daily. Mix one tablespoon with 8oz of your favorite juice or water every day until you are having soft formed stools. Then start taking once daily if you didn't have a stool the day before. 12/06/17   Merlyn Lot, MD  Probiotic Product (PROBIOTIC & ACIDOPHILUS EX ST) CAPS Take 1 capsule by mouth daily as needed.    [provider]  psyllium (METAMUCIL) 58.6 % powder Take 1 packet by mouth daily as needed.    [provider]  Salicylic Acid 2 % CREA Apply topically.    [provider]    Allergies Ciprofloxacin, Fish allergy, and Fish-derived products  Family History  Problem Relation Age of Onset  . Heart disease Mother   . Hypertension Mother   . Heart disease Father   . Hypertension Father     Social History Social History   Tobacco Use  . Smoking status: Never Smoker  . Smokeless tobacco: Never Used  Substance Use Topics  . Alcohol use: No    Alcohol/week: 0.0 standard drinks  . Drug use: No     Review of Systems  Constitutional: No fever/chills.  Positive for syncope today. Eyes: No visual changes. No discharge ENT: No upper respiratory complaints. Cardiovascular: no chest pain. Respiratory: no cough. No SOB. Gastrointestinal: Slightly increased abdominal pain, however chronic abdominal pain.[name]  Positive vomiting.  No diarrhea.  Chronic constipation. Genitourinary: Negative for dysuria. No hematuria Musculoskeletal: Negative for  musculoskeletal pain. Skin: Negative for rash, abrasions, lacerations, ecchymosis. Neurological: Negative for headaches, focal weakness or numbness. 10-point ROS otherwise negative.  ____________________________________________   PHYSICAL EXAM:  VITAL SIGNS: ED Triage Vitals [11/30/19 1821]  Enc Vitals Group     BP 133/69     Pulse Rate 67     Resp (!) 27     Temp      Temp src      SpO2 100 %     Weight 130 lb (59 kg)     Height      Head Circumference      Peak Flow      Pain Score 5     Pain Loc      Pain Edu?      Excl. in [place]?      Constitutional: Alert and oriented. Well appearing and in no acute distress. Eyes: Conjunctivae are normal. PERRL. EOMI. Head: Atraumatic. ENT:      Ears:       Nose: No  congestion/rhinnorhea.      Mouth/Throat: Mucous membranes are moist.  Neck: No stridor.   Hematological/Lymphatic/Immunilogical: No cervical lymphadenopathy. Cardiovascular: Normal rate, regular rhythm. Normal S1 and S2.  Good peripheral circulation. Respiratory: Normal respiratory effort without tachypnea or retractions. Lungs CTAB. Good air entry to the bases with no decreased or absent breath sounds. Gastrointestinal: Bowel sounds 4 quadrants.  Soft to palpation all quadrants.  Patient has mild diffuse tenderness to palpation.  However, patient states that when she touches her own abdomen at home she normally has tenderness.  No guarding or rigidity. No palpable masses. No distention. No CVA tenderness. Musculoskeletal: Full range of motion to all extremities. No gross deformities appreciated. Neurologic:  Normal speech and language. No gross focal neurologic deficits are appreciated.  Skin:  Skin is warm, dry and intact. No rash noted. Psychiatric: Mood and affect are normal. Speech and behavior are normal. Patient exhibits appropriate insight and judgement.   ____________________________________________   LABS (all labs ordered are listed, but only abnormal  results are displayed)  Labs Reviewed  COMPREHENSIVE METABOLIC PANEL - Abnormal; Notable for the following components:      Result Value   Sodium 134 (*)    CO2 21 (*)    Glucose, Bld 146 (*)    All other components within normal limits  CBC WITH DIFFERENTIAL/PLATELET  URINALYSIS, COMPLETE (UACMP) WITH MICROSCOPIC  TROPONIN I (HIGH SENSITIVITY)  TROPONIN I (HIGH SENSITIVITY)   ____________________________________________  EKG  ED ECG REPORT I, Charline Bills Divon Krabill,  personally viewed and interpreted this ECG.   Date: 11/30/2019  EKG Time: 1824 hrs.  Rate: 67 bpm  Rhythm: there are no previous tracings available for comparison, normal sinus rhythm, 1st degree AV block  Axis: Normal axis  Intervals:first-degree A-V block   ST&T Change: No ST elevation or depression noted  Normal sinus rhythm with first-degree block.  No STEMI.  ____________________________________________  RADIOLOGY I personally viewed and evaluated these images as part of my medical decision making, as well as reviewing the written report by the radiologist.  DG Abdomen 1 View  Result Date: 11/30/2019 CLINICAL DATA:  Constipation and emesis EXAM: ABDOMEN - 1 VIEW COMPARISON:  12/06/2017 FINDINGS: Scattered large and small bowel gas is noted. No obstructive changes are seen. No free air is seen. Degenerative changes of the lumbar spine are noted. IMPRESSION: No acute abnormality noted. Electronically Signed   By: Inez Catalina M.D.   On: 11/30/2019 23:00    ____________________________________________    PROCEDURES  Procedure(s) performed:    Procedures    Medications - No data to display   ____________________________________________   INITIAL IMPRESSION / ASSESSMENT AND PLAN / ED COURSE  Pertinent labs & imaging results that were available during my care of the patient were reviewed by me and considered in my medical decision making (see chart for details).  Review of the [place] CSRS was  performed in accordance of the [place] prior to dispensing any controlled drugs.  Clinical Course as of Nov 29 2342  Molli Knock Nov 30, 2019  2238 Patient presented to emergency department with emesis, syncope, slightly increased abdominal pain.  Patient has multiple chronic complaints including unexplained syncopal episodes, chronic abdominal pain, chronic constipation.  Patient was brought to the emergency department for evaluation as she had emesis prior to her syncopal episode today.  Patient has had no return of emesis.  Overall exam is reassuring.  Labs to this point are reassuring.  Urinalysis is still pending.  I will obtain  abdominal x-ray to ensure no evidence of obstruction, however patient does have chronic constipation.  EKG is reassuring at this time.   [JC]    Clinical Course User Index [JC] Akiba Melfi, Charline Bills, PA-C          Patient's diagnosis is consistent with emesis, syncope.  Patient presented to emergency department after vomiting several times this morning and having a syncopal episode.  Patient does have a history of ongoing syncopal episodes without a specific known origin.  Change today was the fact the patient had emesis.  She currently is stable.  Vital signs, labs are reassuring.  No ongoing nausea or emesis at this time.  Prescribe Zofran for any return of symptoms.  Follow-up primary care as needed..  Patient is given ED precautions to return to the ED for any worsening or new symptoms.     ____________________________________________  FINAL CLINICAL IMPRESSION(S) / ED DIAGNOSES  Final diagnoses:  Non-intractable vomiting with nausea, unspecified vomiting type  Syncope, unspecified syncope type      NEW MEDICATIONS STARTED DURING THIS VISIT:  ED Discharge Orders         Ordered    ondansetron (ZOFRAN-ODT) 4 MG disintegrating tablet  Every 8 hours PRN     11/30/19 2311              This chart was dictated using voice recognition software/Dragon.  Despite best efforts to proofread, errors can occur which can change the meaning. Any change was purely unintentional.    Darletta Moll, PA-C 11/30/19 2344    Duffy Bruce, MD 12/01/19 [phone]

## 2019-12-01 DIAGNOSIS — R112 Nausea with vomiting, unspecified: Secondary | ICD-10-CM | POA: Diagnosis not present

## 2019-12-01 LAB — URINALYSIS, COMPLETE (UACMP) WITH MICROSCOPIC
Bacteria, UA: NONE SEEN
Bilirubin Urine: NEGATIVE
Glucose, UA: NEGATIVE mg/dL
Hgb urine dipstick: NEGATIVE
Ketones, ur: 5 mg/dL — AB
Nitrite: NEGATIVE
Protein, ur: NEGATIVE mg/dL
Specific Gravity, Urine: 1.013 (ref 1.005–1.030)
Squamous Epithelial / HPF: NONE SEEN (ref 0–5)
pH: 7 (ref 5.0–8.0)

## 2019-12-01 MED ORDER — MUPIROCIN 2 % EX OINT: TOPICAL_OINTMENT | CUTANEOUS | 0 refills | Status: DC

## 2019-12-01 MED ORDER — FOSFOMYCIN TROMETHAMINE 3 G PO PACK
3.0000 g | PACK | Freq: Once | ORAL | Status: AC
  Administered 2019-12-01: 01:00:00 3 g via ORAL
  Filled 2019-12-01: qty 3

## 2019-12-01 MED ORDER — ONDANSETRON 4 MG PO TBDP
4.0000 mg | ORAL_TABLET | Freq: Once | ORAL | Status: DC
  Filled 2019-12-01: qty 1

## 2019-12-01 MED ORDER — CEPHALEXIN 500 MG PO CAPS: 500.0000 mg | ORAL_CAPSULE | Freq: Once | ORAL | Status: DC

## 2019-12-10 ENCOUNTER — Encounter: Payer: Self-pay | Admitting: Podiatry

## 2019-12-10 ENCOUNTER — Other Ambulatory Visit: Payer: Self-pay

## 2019-12-10 ENCOUNTER — Ambulatory Visit (INDEPENDENT_AMBULATORY_CARE_PROVIDER_SITE_OTHER): Payer: Medicare Other | Admitting: Podiatry

## 2019-12-10 DIAGNOSIS — Z09 Encounter for follow-up examination after completed treatment for conditions other than malignant neoplasm: Secondary | ICD-10-CM | POA: Insufficient documentation

## 2019-12-10 HISTORY — DX: Encounter for follow-up examination after completed treatment for conditions other than malignant neoplasm: Z09

## 2019-12-10 NOTE — Progress Notes (Signed)
This patient returns to the office following nail surgery ten days  ago.  The patient says toe has been soaked and bandaged as directed.  There has been improvement of the toe since the surgery has been performed. Patient was late at starting her antibiotics so she is still taking the medicine.  Patient says there is still pain at base nail border.   The patient presents for continued evaluation and treatment.  GENERAL APPEARANCE: Alert, conversant. Appropriately groomed. No acute distress.  VASCULAR: Pedal pulses palpable at  [facility] and PT bilateral.  Capillary refill time is immediate to all digits,  Normal temperature gradient.    NEUROLOGIC: sensation is normal to 5.07 monofilament at 5/5 sites bilateral.  Light touch is intact bilateral, Muscle strength normal.  MUSCULOSKELETAL: acceptable muscle strength, tone and stability bilateral.  Intrinsic muscluature intact bilateral.  Rectus appearance of foot and digits noted bilateral.   DERMATOLOGIC: skin color, texture, and turgor are within normal limits.  No preulcerative lesions or ulcers  are seen, no interdigital maceration noted.   NAILS  There is necrotic tissue along the nail groove  In the absence of redness swelling and pain.  Desquamation noted at surgical site.  Mild pain upon palpation.  DX  S/p nail surgery  ROV  Home instructions were discussed.  Patient to call the office if there are any questions or concerns.  Air dry and peroxide.  Take remaining antibiotics.   Gardiner Barefoot DPM

## 2020-02-15 ENCOUNTER — Ambulatory Visit (INDEPENDENT_AMBULATORY_CARE_PROVIDER_SITE_OTHER): Payer: Medicare Other | Admitting: Podiatry

## 2020-02-15 ENCOUNTER — Encounter: Payer: Self-pay | Admitting: Podiatry

## 2020-02-15 ENCOUNTER — Other Ambulatory Visit: Payer: Self-pay

## 2020-02-15 VITALS — Temp 97.0°F

## 2020-02-15 DIAGNOSIS — M21619 Bunion of unspecified foot: Secondary | ICD-10-CM | POA: Diagnosis not present

## 2020-02-15 DIAGNOSIS — B351 Tinea unguium: Secondary | ICD-10-CM

## 2020-02-15 DIAGNOSIS — M79676 Pain in unspecified toe(s): Secondary | ICD-10-CM

## 2020-02-16 ENCOUNTER — Encounter: Payer: Self-pay | Admitting: Podiatry

## 2020-02-16 NOTE — Progress Notes (Signed)
  Subjective:  Patient ID: Carmen Humphrey, female    DOB: 08-01-1925,  MRN: [id number]  Chief Complaint  Patient presents with  . Nail Problem    nail trim RFC   86 y.o. female returns for the above complaint.  Patient presents with thickened elongated mycotic toenails x10.  Patient states it painful to walk on.  She has hard time and ambulating with this dystrophic toenails.  She states that she also got bunion and hammertoes that makes it very difficult.  She does not want to treat them.  She would like to know if there is anything else that could be done for this.  She would like to have the toenails debrided down as well.  She denies any other acute complaints.  Objective:   Vitals:   02/15/20 1546  Temp: (!) 97 F (36.1 C)   Podiatric Exam: Vascular: dorsalis pedis and posterior tibial pulses are palpable bilateral. Capillary return is immediate. Temperature gradient is WNL. Skin turgor WNL  Sensorium: Normal Semmes Weinstein monofilament test. Normal tactile sensation bilaterally. Nail Exam: Pt has thick disfigured discolored nails with subungual debris noted bilateral entire nail hallux through fifth toenails Ulcer Exam: There is no evidence of ulcer or pre-ulcerative changes or infection. Orthopedic Exam: Muscle tone and strength are WNL. No limitations in general ROM. No crepitus or effusions noted. HAV  B/L.  Hammer toes 2-5  B/L. Skin: No Porokeratosis. No infection or ulcers  Assessment & Plan:  Patient was evaluated and treated and all questions answered.  Onychomycosis with pain  -Nails palliatively debrided as below. -Educated on self-care  Bilateral severe bunion deformity with hammertoe contractures of 2 through 5 -I explained the patient the etiology of bunion deformity and hammertoes various treatment options were discussed with the patient.  For now patient will benefit from biotic gear shoe gear modification.  Given that her pain is very mild, I believe she would  just benefit from conservative management.  Procedure: Nail Debridement Rationale: pain  Type of Debridement: manual, sharp debridement. Instrumentation: Nail nipper, rotary burr. Number of Nails: 10  Procedures and Treatment: Consent by patient was obtained for treatment procedures. The patient understood the discussion of treatment and procedures well. All questions were answered thoroughly reviewed. Debridement of mycotic and hypertrophic toenails, 1 through 5 bilateral and clearing of subungual debris. No ulceration, no infection noted.  Return Visit-Office Procedure: Patient instructed to return to the office for a follow up visit 3 months for continued evaluation and treatment.  Boneta Lucks, DPM    No follow-ups on file.

## 2020-03-08 ENCOUNTER — Ambulatory Visit (INDEPENDENT_AMBULATORY_CARE_PROVIDER_SITE_OTHER): Payer: Medicare Other | Admitting: Podiatry

## 2020-03-08 ENCOUNTER — Other Ambulatory Visit: Payer: Self-pay

## 2020-03-08 DIAGNOSIS — M21619 Bunion of unspecified foot: Secondary | ICD-10-CM

## 2020-03-08 DIAGNOSIS — M2040 Other hammer toe(s) (acquired), unspecified foot: Secondary | ICD-10-CM | POA: Diagnosis not present

## 2020-03-08 DIAGNOSIS — L600 Ingrowing nail: Secondary | ICD-10-CM

## 2020-03-09 ENCOUNTER — Encounter: Payer: Self-pay | Admitting: Podiatry

## 2020-03-09 NOTE — Progress Notes (Signed)
  Subjective:  Patient ID: Carmen Humphrey, female    DOB: August 07, 1925,  MRN: [id number]  Chief Complaint  Patient presents with  . Ingrown Toenail    pt is here for a possible ingrown of the right great toenail lateral side, pt states that it is painful when walking on it, pt also states that there has been some pus drainage as well.   86 y.o. female returns for the above complaint.  Patient presents with acute complaint of pain in the right great toe when rubbing against the second toe.  Patient states she is painful when walking on it.  There is some soreness associated with it.  She was concerned they might be ingrown.  Patient states that there is some oozing associated with it as well.  She denies any other acute complaints.  Objective:   There were no vitals filed for this visit. Podiatric Exam: Vascular: dorsalis pedis and posterior tibial pulses are palpable bilateral. Capillary return is immediate. Temperature gradient is WNL. Skin turgor WNL  Sensorium: Normal Semmes Weinstein monofilament test. Normal tactile sensation bilaterally. Nail Exam: Pt has thick disfigured discolored nails with subungual debris noted bilateral entire nail hallux through fifth toenails Ulcer Exam: There is no evidence of ulcer or pre-ulcerative changes or infection. Orthopedic Exam: Muscle tone and strength are WNL. No limitations in general ROM. No crepitus or effusions noted. HAV  B/L.  Hammer toes 2-5  B/L. Skin: No Porokeratosis. No infection or ulcers.  Mild ingrown noted of the right hallux without any paronychia or clinical signs of infection.  Assessment & Plan:  Patient was evaluated and treated and all questions answered.  Bilateral severe bunion deformity with hammertoe contractures of 2 through 5 -I explained the patient the etiology of bunion deformity and hammertoes various treatment options were discussed with the patient.  For now patient will benefit from biotic gear shoe gear  modification.  Given that her pain is very mild, I believe she would just benefit from conservative management. -Patient was given some spacer to help offload the bunions as well as hammertoe contractures. -I discussed with her that she does not need any surgical intervention for now.   Right hallux mild ingrown -I explained to the patient the etiology of ingrown and likely the hallux valgus rotation of the big toe into the second toe is still primary cause of the pain associated with this.  Unfortunately I will hold off on removal given patient's age and the pain associated with this has to do with the biomechanical deformity of the toe.  I believe she will benefit from a toe spacer.  Toe spacer was dispensed.  Boneta Lucks, DPM    No follow-ups on file.

## 2020-03-31 ENCOUNTER — Ambulatory Visit (INDEPENDENT_AMBULATORY_CARE_PROVIDER_SITE_OTHER): Payer: Medicare Other | Admitting: Podiatry

## 2020-03-31 ENCOUNTER — Other Ambulatory Visit: Payer: Self-pay

## 2020-03-31 DIAGNOSIS — M2040 Other hammer toe(s) (acquired), unspecified foot: Secondary | ICD-10-CM | POA: Diagnosis not present

## 2020-03-31 DIAGNOSIS — M21619 Bunion of unspecified foot: Secondary | ICD-10-CM | POA: Diagnosis not present

## 2020-04-01 ENCOUNTER — Encounter: Payer: Self-pay | Admitting: Podiatry

## 2020-04-01 NOTE — Progress Notes (Signed)
  Subjective:  Patient ID: Carmen Humphrey, female    DOB: 01/31/1925,  MRN: [id number]  Chief Complaint  Patient presents with  . Foot Pain    pt is here for right foot pain, pt is concerned that her first two toes on her right foot is still painful   86 y.o. female returns for the above complaint.  Patient presents with a follow-up of right great toe as well as second digit contracture leading to pain between the toes.  Patient states offloading pad has helped.  The spacer sometimes helps.  She presents with shoes that are the medium in the toebox that seems to be causing a lot of pain when applying pressure and ambulating.  She denies any other acute complaints..  Objective:   There were no vitals filed for this visit. Podiatric Exam: Vascular: dorsalis pedis and posterior tibial pulses are palpable bilateral. Capillary return is immediate. Temperature gradient is WNL. Skin turgor WNL  Sensorium: Normal Semmes Weinstein monofilament test. Normal tactile sensation bilaterally. Nail Exam: Pt has thick disfigured discolored nails with subungual debris noted bilateral entire nail hallux through fifth toenails Ulcer Exam: There is no evidence of ulcer or pre-ulcerative changes or infection. Orthopedic Exam: Muscle tone and strength are WNL. No limitations in general ROM. No crepitus or effusions noted. HAV  B/L.  Hammer toes 2-5  B/L. Skin: No Porokeratosis. No infection or ulcers.  Mild ingrown noted of the right hallux without any paronychia or clinical signs of infection.  Assessment & Plan:  Patient was evaluated and treated and all questions answered.  Bilateral severe bunion deformity with hammertoe contractures of 2 through 5 -I explained the patient the etiology of bunion deformity and hammertoes various treatment options were discussed with the patient.  For now patient will benefit from biotic gear shoe gear modification.  Given that her pain is very mild, I believe she would just  benefit from conservative management. -The spacer has help offload the bunion as well as a hammertoe contracture. -I also discussed with her wider shoe gear modification. -Patient will benefit from surgical shoe with open toe management to take the pressure off.  Patient states understanding will wear surgical shoe until they can get new balance sneakers with wider toe box.   Right hallux mild ingrown -I explained to the patient the etiology of ingrown and likely the hallux valgus rotation of the big toe into the second toe is still primary cause of the pain associated with this.  Unfortunately I will hold off on removal given patient's age and the pain associated with this has to do with the biomechanical deformity of the toe.  I believe she will benefit from a toe spacer.  Toe spacer was dispensed.  Boneta Lucks, DPM    No follow-ups on file.

## 2020-05-17 ENCOUNTER — Ambulatory Visit: Payer: Medicare Other | Admitting: Podiatry

## 2020-06-07 ENCOUNTER — Encounter: Payer: Self-pay | Admitting: Podiatry

## 2020-06-07 ENCOUNTER — Other Ambulatory Visit: Payer: Self-pay

## 2020-06-07 ENCOUNTER — Ambulatory Visit (INDEPENDENT_AMBULATORY_CARE_PROVIDER_SITE_OTHER): Payer: Medicare Other | Admitting: Podiatry

## 2020-06-07 DIAGNOSIS — M2040 Other hammer toe(s) (acquired), unspecified foot: Secondary | ICD-10-CM | POA: Diagnosis not present

## 2020-06-07 DIAGNOSIS — M79609 Pain in unspecified limb: Secondary | ICD-10-CM

## 2020-06-07 DIAGNOSIS — B351 Tinea unguium: Secondary | ICD-10-CM | POA: Diagnosis not present

## 2020-06-07 NOTE — Progress Notes (Signed)
  Subjective:  Patient ID: Carmen Humphrey, female    DOB: 27-Sep-1925,  MRN: [id number]  Chief Complaint  Patient presents with  . Nail Problem    RFC, nail trim   86 y.o. female returns for the above complaint.  Patient presents with a follow-up of right great toe as well as second digit contracture leading to pain between the toes.  Patient states offloading pad has helped.  The spacer sometimes helps.  She presents with shoes that are the medium in the toebox that seems to be causing a lot of pain when applying pressure and ambulating.  She denies any other acute complaints..  Objective:   There were no vitals filed for this visit. Podiatric Exam: Vascular: dorsalis pedis and posterior tibial pulses are palpable bilateral. Capillary return is immediate. Temperature gradient is WNL. Skin turgor WNL  Sensorium: Normal Semmes Weinstein monofilament test. Normal tactile sensation bilaterally. Nail Exam: Pt has thick disfigured discolored nails with subungual debris noted bilateral entire nail hallux through fifth toenails Ulcer Exam: There is no evidence of ulcer or pre-ulcerative changes or infection. Orthopedic Exam: Muscle tone and strength are WNL. No limitations in general ROM. No crepitus or effusions noted. HAV  B/L.  Hammer toes 2-5  B/L. Skin: No Porokeratosis. No infection or ulcers.  Mild ingrown noted of the right hallux without any paronychia or clinical signs of infection.  Assessment & Plan:  Patient was evaluated and treated and all questions answered.  Bilateral severe bunion deformity with hammertoe contractures of 2 through 5 -The spacer and offloading with shoe gear modification has helped tremendously.  Patient still has some pain but much more manageable.  Onychomycosis with pain  -Nails palliatively debrided as below. -Educated on self-care  Procedure: Nail Debridement Rationale: pain  Type of Debridement: manual, sharp debridement. Instrumentation: Nail nipper,  rotary burr. Number of Nails: 10  Procedures and Treatment: Consent by patient was obtained for treatment procedures. The patient understood the discussion of treatment and procedures well. All questions were answered thoroughly reviewed. Debridement of mycotic and hypertrophic toenails, 1 through 5 bilateral and clearing of subungual debris. No ulceration, no infection noted.  Return Visit-Office Procedure: Patient instructed to return to the office for a follow up visit 3 months for continued evaluation and treatment.  Boneta Lucks, DPM    No follow-ups on file.    Right hallux mild ingrown -I explained to the patient the etiology of ingrown and likely the hallux valgus rotation of the big toe into the second toe is still primary cause of the pain associated with this.  Unfortunately I will hold off on removal given patient's age and the pain associated with this has to do with the biomechanical deformity of the toe.  I believe she will benefit from a toe spacer.  Toe spacer was dispensed.  Boneta Lucks, DPM    No follow-ups on file.

## 2020-06-23 ENCOUNTER — Other Ambulatory Visit: Payer: Self-pay

## 2020-06-23 ENCOUNTER — Encounter (INDEPENDENT_AMBULATORY_CARE_PROVIDER_SITE_OTHER): Payer: Self-pay | Admitting: Vascular Surgery

## 2020-06-23 ENCOUNTER — Ambulatory Visit (INDEPENDENT_AMBULATORY_CARE_PROVIDER_SITE_OTHER): Payer: Medicare Other | Admitting: Vascular Surgery

## 2020-06-23 VITALS — BP 157/72 | HR 76 | Resp 16 | Ht 67.0 in | Wt 125.0 lb

## 2020-06-23 DIAGNOSIS — I82433 Acute embolism and thrombosis of popliteal vein, bilateral: Secondary | ICD-10-CM | POA: Diagnosis not present

## 2020-06-23 DIAGNOSIS — M159 Polyosteoarthritis, unspecified: Secondary | ICD-10-CM

## 2020-06-23 DIAGNOSIS — I2510 Atherosclerotic heart disease of native coronary artery without angina pectoris: Secondary | ICD-10-CM | POA: Diagnosis not present

## 2020-06-23 DIAGNOSIS — I82409 Acute embolism and thrombosis of unspecified deep veins of unspecified lower extremity: Secondary | ICD-10-CM | POA: Insufficient documentation

## 2020-06-23 DIAGNOSIS — M8949 Other hypertrophic osteoarthropathy, multiple sites: Secondary | ICD-10-CM | POA: Diagnosis not present

## 2020-06-23 NOTE — Progress Notes (Signed)
MRN : [id number]  Carmen Humphrey is a 86 y.o. (1924/12/04) female who presents with chief complaint of No chief complaint on file. [name]  History of Present Illness:   The patient presents to the office for evaluation of DVT.  DVT was identified at [facility] by Duplex ultrasound.  The initial symptoms were pain and swelling in the lower extremity.  The patient is noncompliant with her anticoagulation and is taking 2.5 mg of Eliquis daily.   The patient notes the leg continues to be very painful with dependency and swells quite a bite.  Symptoms are much better with elevation.  The patient notes minimal edema in the morning which steadily worsens throughout the day.    The patient has not been using compression therapy at this point.  No SOB or pleuritic chest pains.  No cough or hemoptysis.  No blood per rectum or blood in any sputum.  No excessive bruising per the patient.   No outpatient medications have been marked as taking for the 06/23/20 encounter (Appointment) with Delana Meyer, Dolores Lory, MD.    Past Medical History:  Diagnosis Date  . Abdominal pain   . Arthritis   . Bowel habit changes    pressure  . Cancer ([place])   . Depression   . Fibromyalgia   . Heart disease 2012  . History of fainting spells of unknown cause   . Inflammatory polyps of colon ([place])   . Poor appetite   . Shortness of breath dyspnea   . Urinary urgency     Past Surgical History:  Procedure Laterality Date  . BREAST SURGERY    . ECTOPIC PREGNANCY SURGERY    . HYSTEROSCOPY WITH D & C N/A 07/23/2016   Procedure: DILATATION AND CURETTAGE;  Surgeon: Benjaman Kindler, MD;  Location: [facility] ORS;  Service: Gynecology;  Laterality: N/A;  . TUBAL LIGATION      Social History Social History   Tobacco Use  . Smoking status: Never Smoker  . Smokeless tobacco: Never Used  Substance Use Topics  . Alcohol use: No    Alcohol/week: 0.0 standard drinks  . Drug use: No    Family History Family History  Problem  Relation Age of Onset  . Heart disease Mother   . Hypertension Mother   . Heart disease Father   . Hypertension Father   No family history of bleeding/clotting disorders, porphyria or autoimmune disease   Allergies  Allergen Reactions  . Ciprofloxacin Nausea And Vomiting  . Fish Allergy Other (See Comments)    Perch Fish  . Fish-Derived Products Other (See Comments)    Perch Fish     REVIEW OF SYSTEMS (Negative unless checked)  Constitutional: [] Weight loss  [] Fever  [] Chills Cardiac: [] Chest pain   [] Chest pressure   [] Palpitations   [] Shortness of breath when laying flat   [] Shortness of breath with exertion. Vascular:  [] Pain in legs with walking   [] Pain in legs at rest  [x] History of DVT   [] Phlebitis   [] Swelling in legs   [] Varicose veins   [] Non-healing ulcers Pulmonary:   [] Uses home oxygen   [] Productive cough   [] Hemoptysis   [] Wheeze  [] COPD   [] Asthma Neurologic:  [] Dizziness   [] Seizures   [] History of stroke   [] History of TIA  [] Aphasia   [] Vissual changes   [] Weakness or numbness in arm   [] Weakness or numbness in leg Musculoskeletal:   [] Joint swelling   [] Joint pain   [] Low back pain Hematologic:  []   Easy bruising  [] Easy bleeding   [] Hypercoagulable state   [] Anemic Gastrointestinal:  [] Diarrhea   [] Vomiting  [] Gastroesophageal reflux/heartburn   [] Difficulty swallowing. Genitourinary:  [] Chronic kidney disease   [] Difficult urination  [] Frequent urination   [] Blood in urine Skin:  [] Rashes   [] Ulcers  Psychological:  [] History of anxiety   []  History of major depression.  Physical Examination  There were no vitals filed for this visit. There is no height or weight on file to calculate BMI. Gen: WD/WN, NAD Head: [place]/AT, No temporalis wasting.  Ear/Nose/Throat: Hearing grossly intact, nares w/o erythema or drainage, poor dentition Eyes: PER, EOMI, sclera nonicteric.  Neck: Supple, no masses.  No bruit or JVD.  Pulmonary:  Good air movement, clear to  auscultation bilaterally, no use of accessory muscles.  Cardiac: RRR, normal S1, S2, no Murmurs. Vascular: scattered varicosities present bilaterally.  Mild venous stasis changes to the legs bilaterally.  2+ soft pitting edema left Vessel Right Left  Radial Palpable Palpable  Gastrointestinal: soft, non-distended. No guarding/no peritoneal signs.  Musculoskeletal: M/S 5/5 throughout.  No deformity or atrophy.  Neurologic: CN 2-12 intact. Pain and light touch intact in extremities.  Symmetrical.  Speech is fluent. Motor exam as listed above. Psychiatric: Judgment intact, Mood & affect appropriate for pt's clinical situation. Dermatologic: No rashes or ulcers noted.  No changes consistent with cellulitis.  CBC Lab Results  Component Value Date   WBC 5.6 11/30/2019   HGB 12.7 11/30/2019   HCT 37.5 11/30/2019   MCV 86.2 11/30/2019   PLT 297 11/30/2019    BMET    Component Value Date/Time   NA 134 (L) 11/30/2019 1824   K 3.7 11/30/2019 1824   CL 102 11/30/2019 1824   CO2 21 (L) 11/30/2019 1824   GLUCOSE 146 (H) 11/30/2019 1824   BUN 10 11/30/2019 1824   CREATININE 0.66 11/30/2019 1824   CALCIUM 9.0 11/30/2019 1824   GFRNONAA >60 11/30/2019 1824   GFRAA >60 11/30/2019 1824   CrCl cannot be calculated (Patient's most recent lab result is older than the maximum 21 days allowed.).  COAG No results found for: INR, PROTIME  Radiology No results found.   Assessment/Plan 1. Acute deep vein thrombosis (DVT) of popliteal vein of both lower extremities (HCC) The patient is refusing IVC filter placement but after a long discussion she is willing to take her Eliquis as ordered bid  I will see her back in December with a follow up Duplex and hopefully can DC the Eliquis then   A total of 50 minutes was spent with this patient and greater than 50% was spent in counseling and coordination of care with the patient.  Discussion included the treatment options for vascular disease including  indications for surgery and intervention.  Also discussed is the appropriate timing of treatment.  In addition medical therapy was discussed.  - VAS [place] LOWER EXTREMITY VENOUS (DVT); Future  2. Atherosclerosis of native coronary artery of native heart without angina pectoris Continue cardiac and antihypertensive medications as already ordered and reviewed, no changes at this time.  Continue statin as ordered and reviewed, no changes at this time  Nitrates PRN for chest pain   3. Primary osteoarthritis involving multiple joints Continue NSAID medications as already ordered, these medications have been reviewed and there are no changes at this time.  Continued activity and therapy was stressed.   Hortencia Pilar, MD  06/23/2020 11:19 AM

## 2020-06-24 ENCOUNTER — Encounter (INDEPENDENT_AMBULATORY_CARE_PROVIDER_SITE_OTHER): Payer: Self-pay | Admitting: Vascular Surgery

## 2020-07-07 ENCOUNTER — Telehealth (INDEPENDENT_AMBULATORY_CARE_PROVIDER_SITE_OTHER): Payer: Self-pay

## 2020-07-07 NOTE — Telephone Encounter (Signed)
pt's daughter [name] called and said that the pt made her aware the night before the she felt that her tongue and eyes were swelling  Due to the eliquis  She wanted to know could her mom stop  The medicine Per her Provider Dr. Delana Meyer  I  Made the daughter aware that if the pt was to stop the medicine she has a 1 and 5 chance of having a blood clot in her lungs and if she stops the medicine  she needs to go to the ER

## 2020-08-09 ENCOUNTER — Encounter: Payer: Self-pay | Admitting: Podiatry

## 2020-08-09 ENCOUNTER — Ambulatory Visit (INDEPENDENT_AMBULATORY_CARE_PROVIDER_SITE_OTHER): Payer: Medicare Other | Admitting: Podiatry

## 2020-08-09 ENCOUNTER — Other Ambulatory Visit: Payer: Self-pay

## 2020-08-09 DIAGNOSIS — M2040 Other hammer toe(s) (acquired), unspecified foot: Secondary | ICD-10-CM | POA: Diagnosis not present

## 2020-08-09 DIAGNOSIS — M21619 Bunion of unspecified foot: Secondary | ICD-10-CM

## 2020-08-10 ENCOUNTER — Encounter: Payer: Self-pay | Admitting: Podiatry

## 2020-08-10 NOTE — Progress Notes (Signed)
  Subjective:  Patient ID: Carmen Humphrey, female    DOB: 02-17-1925,  MRN: [id number]  Chief Complaint  Patient presents with  . Foot Pain    patient comes in today with continued pain in bilat feet and toes   86 y.o. female returns for the above complaint.  Patient presents with a follow-up of right great toe as well as second digit contracture leading to pain between the toes.  Patient states offloading pad has helped.  The spacer sometimes helps.  However she has not been very compliant with utilizing it.  She presents with shoes that are the medium in the toebox that seems to be causing a lot of pain when applying pressure and ambulating.  She denies any other acute complaints..  Objective:   There were no vitals filed for this visit. Podiatric Exam: Vascular: dorsalis pedis and posterior tibial pulses are palpable bilateral. Capillary return is immediate. Temperature gradient is WNL. Skin turgor WNL  Sensorium: Normal Semmes Weinstein monofilament test. Normal tactile sensation bilaterally. Nail Exam: Pt has thick disfigured discolored nails with subungual debris noted bilateral entire nail hallux through fifth toenails Ulcer Exam: There is no evidence of ulcer or pre-ulcerative changes or infection. Orthopedic Exam: Muscle tone and strength are WNL. No limitations in general ROM. No crepitus or effusions noted. HAV  B/L.  Hammer toes 2-5  B/L. Skin: No Porokeratosis. No infection or ulcers.  Mild ingrown noted of the right hallux without any paronychia or clinical signs of infection.  Assessment & Plan:  Patient was evaluated and treated and all questions answered.  Bilateral severe bunion deformity with hammertoe contractures of 2 through 5 -The spacer and offloading with shoe gear modification has helped tremendously.  Patient still has some pain but much more manageable.  Patient just seems to continue to have the same discussion without any acute management.  She also has been  noncompliant with conservative treatment options including applying toe pressure is appropriately.  Right hallux mild ingrown -I explained to the patient the etiology of ingrown and likely the hallux valgus rotation of the big toe into the second toe is still primary cause of the pain associated with this.  Unfortunately I will hold off on removal given patient's age and the pain associated with this has to do with the biomechanical deformity of the toe.  I believe she will benefit from a toe spacer.  Toe spacer was dispensed.  Onychomycosis with pain  -Nails palliatively debrided as below. -Educated on self-care  Procedure: Nail Debridement Rationale: pain  Type of Debridement: manual, sharp debridement. Instrumentation: Nail nipper, rotary burr. Number of Nails: 10  Procedures and Treatment: Consent by patient was obtained for treatment procedures. The patient understood the discussion of treatment and procedures well. All questions were answered thoroughly reviewed. Debridement of mycotic and hypertrophic toenails, 1 through 5 bilateral and clearing of subungual debris. No ulceration, no infection noted.  Return Visit-Office Procedure: Patient instructed to return to the office for a follow up visit 3 months for continued evaluation and treatment.  Boneta Lucks, DPM    No follow-ups on file.      Boneta Lucks, DPM    No follow-ups on file.

## 2020-08-30 ENCOUNTER — Telehealth (INDEPENDENT_AMBULATORY_CARE_PROVIDER_SITE_OTHER): Payer: Self-pay

## 2020-08-30 NOTE — Telephone Encounter (Signed)
I called the pts daughter  [name] and left a VM asking was their any reason why she wanted her mom to be seen earlier than her scheduled appointment an for her to call the office.

## 2020-08-30 NOTE — Telephone Encounter (Signed)
That is a question that needs to be answered by a provider.

## 2020-08-30 NOTE — Telephone Encounter (Signed)
The pts daughter left a message on the nurses line that the pt has an appointment in December and would like to know  If she could be seem sooner  If possible this month.

## 2020-08-31 ENCOUNTER — Other Ambulatory Visit: Payer: Self-pay

## 2020-08-31 ENCOUNTER — Ambulatory Visit (INDEPENDENT_AMBULATORY_CARE_PROVIDER_SITE_OTHER): Payer: Medicare Other | Admitting: Nurse Practitioner

## 2020-08-31 ENCOUNTER — Encounter (INDEPENDENT_AMBULATORY_CARE_PROVIDER_SITE_OTHER): Payer: Self-pay | Admitting: Nurse Practitioner

## 2020-08-31 VITALS — BP 157/73 | HR 73 | Ht 66.0 in | Wt 129.0 lb

## 2020-08-31 DIAGNOSIS — I82433 Acute embolism and thrombosis of popliteal vein, bilateral: Secondary | ICD-10-CM | POA: Diagnosis not present

## 2020-08-31 DIAGNOSIS — E785 Hyperlipidemia, unspecified: Secondary | ICD-10-CM | POA: Diagnosis not present

## 2020-08-31 DIAGNOSIS — F09 Unspecified mental disorder due to known physiological condition: Secondary | ICD-10-CM

## 2020-08-31 NOTE — Progress Notes (Signed)
Subjective:    Patient ID: Carmen Humphrey, female    DOB: January 31, 1925, 86 y.o.   MRN: [id number] Chief Complaint  Patient presents with  . Follow-up    add on per phone note    The patient presents today due to concerns of the veins on her lower extremities.  The patient notes that there are some spider veins that have popped up since she has been taking her Eliquis.  The patient is very concerned that the Eliquis is causing her to have increased spider veins.  These veins are not tender or sore.  There has been no bleeding from these veins.  No signs of superficial thrombophlebitis or infection.  The patient does have a history of DVT and was recently placed on Eliquis for treatment of her DVT.  The patient also has the medication insert from Eliquis with several highlights and noted areas.  She also has concerned that her tongue is painful and sore and is concerned that this may be a precursor to swelling.  However the patient denies any shortness of breath or difficulty with swallowing.  She denies any chest pain or shortness of breath.  Patient does have a history of dementia.   Review of Systems  Cardiovascular:       Spider veins  Psychiatric/Behavioral: The patient is nervous/anxious.   All other systems reviewed and are negative.      Objective:   Physical Exam Vitals reviewed.  HENT:     Head: Normocephalic.  Cardiovascular:     Rate and Rhythm: Normal rate and regular rhythm.     Pulses: Normal pulses.     Comments: Spider veins Pulmonary:     Effort: Pulmonary effort is normal.  Skin:    General: Skin is warm.  Neurological:     Mental Status: She is alert and oriented to person, place, and time.  Psychiatric:        Mood and Affect: Mood normal.        Behavior: Behavior normal.        Thought Content: Thought content normal.        Judgment: Judgment normal.     BP (!) 157/73   Pulse 73   Ht 5\' 6"  (1.676 m)   Wt 129 lb (58.5 kg)   BMI 20.82 kg/m   Past  Medical History:  Diagnosis Date  . Abdominal pain   . Arthritis   . Bowel habit changes    pressure  . Cancer ([place])   . Depression   . Fibromyalgia   . Heart disease 2012  . History of fainting spells of unknown cause   . Inflammatory polyps of colon ([place])   . Poor appetite   . Shortness of breath dyspnea   . Urinary urgency     Social History   Socioeconomic History  . Marital status: Widowed    Spouse name: Not on file  . Number of children: Not on file  . Years of education: Not on file  . Highest education level: Not on file  Occupational History  . Not on file  Tobacco Use  . Smoking status: Never Smoker  . Smokeless tobacco: Never Used  Substance and Sexual Activity  . Alcohol use: No    Alcohol/week: 0.0 standard drinks  . Drug use: No  . Sexual activity: Not on file  Other Topics Concern  . Not on file  Social History Narrative  . Not on file   Social Determinants of  Health   Financial Resource Strain:   . Difficulty of Paying Living Expenses: Not on file  Food Insecurity:   . Worried About Charity fundraiser in the Last Year: Not on file  . Ran Out of Food in the Last Year: Not on file  Transportation Needs:   . Lack of Transportation (Medical): Not on file  . Lack of Transportation (Non-Medical): Not on file  Physical Activity:   . Days of Exercise per Week: Not on file  . Minutes of Exercise per Session: Not on file  Stress:   . Feeling of Stress : Not on file  Social Connections:   . Frequency of Communication with Friends and Family: Not on file  . Frequency of Social Gatherings with Friends and Family: Not on file  . Attends Religious Services: Not on file  . Active Member of Clubs or Organizations: Not on file  . Attends Archivist Meetings: Not on file  . Marital Status: Not on file  Intimate Partner Violence:   . Fear of Current or Ex-Partner: Not on file  . Emotionally Abused: Not on file  . Physically Abused: Not on file    . Sexually Abused: Not on file    Past Surgical History:  Procedure Laterality Date  . BREAST SURGERY    . ECTOPIC PREGNANCY SURGERY    . HYSTEROSCOPY WITH D & C N/A 07/23/2016   Procedure: DILATATION AND CURETTAGE;  Surgeon: Benjaman Kindler, MD;  Location: [facility] ORS;  Service: Gynecology;  Laterality: N/A;  . TUBAL LIGATION      Family History  Problem Relation Age of Onset  . Heart disease Mother   . Hypertension Mother   . Heart disease Father   . Hypertension Father     Allergies  Allergen Reactions  . Ciprofloxacin Nausea And Vomiting  . Fish Allergy Other (See Comments)    Perch Fish  . Fish-Derived Products Other (See Comments)    Perch Fish  . Shellfish-Derived Products        Assessment & Plan:   1. Acute deep vein thrombosis (DVT) of popliteal vein of both lower extremities (HCC) Patient will continue taking her Eliquis as prescribed.  Stressed to the patient that continuing her Eliquis would help her blood clot not make her sicker.  Stressed that continued adherence to medical therapy will help to increase the likelihood that she can stop it at her upcoming visit.  2. Dyslipidemia Continue statin as ordered and reviewed, no changes at this time   3. Cognitive and neurobehavioral dysfunction Unfortunately the patient does have dementia which made discussion somewhat difficult.  The patient has severe anxiety over her listed medication side effects of Eliquis.  Discussed with patient that side effects she read in the medication pamphlet she is not exhibiting that it is currently safe to continue taking.  Family was there to reinforce as well.   Current Outpatient Medications on File Prior to Visit  Medication Sig Dispense Refill  . apixaban (ELIQUIS) 5 MG TABS tablet Take 5 mg by mouth 2 (two) times daily.    . Cholecalciferol (VITAMIN D3) 1000 units CAPS Take 1 capsule by mouth daily.    . Coenzyme Q10-Fish Oil-Vit E (CO-Q 10 OMEGA-3 FISH OIL) CAPS Take 2  capsules by mouth daily. (Patient not taking: Reported on 06/23/2020)    . docusate sodium (COLACE) 100 MG capsule Take 1 capsule (100 mg total) by mouth daily as needed for mild constipation. (Patient not taking: Reported  on 06/23/2020) 60 capsule 3  . ENSURE (ENSURE) Take 1 Can by mouth every other day.    . furosemide (LASIX) 20 MG tablet Take 20 mg by mouth daily. (Patient not taking: Reported on 06/23/2020)    . Misc Natural Products (COLON CARE PO) Take 1 capsule by mouth daily. (Patient not taking: Reported on 06/23/2020)    . mupirocin ointment (BACTROBAN) 2 % Apply to affected area 3 times daily for 10 days (Patient not taking: Reported on 06/23/2020) 22 g 0  . NON FORMULARY Apply 1 Dose topically daily. Blue stuff pain relief cream (Patient not taking: Reported on 08/31/2020)    . ondansetron (ZOFRAN-ODT) 4 MG disintegrating tablet Take 1 tablet (4 mg total) by mouth every 8 (eight) hours as needed for nausea or vomiting. (Patient not taking: Reported on 06/23/2020) 20 tablet 0  . polyethylene glycol (MIRALAX / GLYCOLAX) packet Take 17 g by mouth daily. Mix one tablespoon with 8oz of your favorite juice or water every day until you are having soft formed stools. Then start taking once daily if you didn't have a stool the day before. (Patient not taking: Reported on 06/23/2020) 30 each 0  . Probiotic Product (PROBIOTIC & ACIDOPHILUS EX ST) CAPS Take 1 capsule by mouth daily as needed. (Patient not taking: Reported on 06/23/2020)    . psyllium (METAMUCIL) 58.6 % powder Take 1 packet by mouth daily as needed. (Patient not taking: Reported on 08/31/2020)    . Salicylic Acid 2 % CREA Apply topically. (Patient not taking: Reported on 06/23/2020)     No current facility-administered medications on file prior to visit.    There are no Patient Instructions on file for this visit. No follow-ups on file.   Kris Hartmann, NP

## 2020-09-13 ENCOUNTER — Other Ambulatory Visit: Payer: Self-pay

## 2020-09-13 ENCOUNTER — Ambulatory Visit (INDEPENDENT_AMBULATORY_CARE_PROVIDER_SITE_OTHER): Payer: Medicare Other | Admitting: Podiatry

## 2020-09-13 ENCOUNTER — Encounter: Payer: Self-pay | Admitting: Podiatry

## 2020-09-13 DIAGNOSIS — D689 Coagulation defect, unspecified: Secondary | ICD-10-CM

## 2020-09-13 DIAGNOSIS — L84 Corns and callosities: Secondary | ICD-10-CM | POA: Diagnosis not present

## 2020-09-13 DIAGNOSIS — B351 Tinea unguium: Secondary | ICD-10-CM

## 2020-09-13 DIAGNOSIS — M79609 Pain in unspecified limb: Secondary | ICD-10-CM | POA: Diagnosis not present

## 2020-09-13 NOTE — Progress Notes (Signed)
Subjective:  Patient ID: Carmen Humphrey, female    DOB: 27-Aug-1925,  MRN: [id number]  Chief Complaint  Patient presents with  . Nail Problem    Patient presents today for ingrown toenails 3rd toe right and painful hammertoes   86 y.o. female returns for the above complaint.  Patient presents with thickened elongated dystrophic toenails x10.  Patient would like to have them debrided down.  She states is painful to touch.  She states that she also has pain in between the first and second toe bilaterally with a possible small callus formation.  She states is painful to walk on they both rub against each other.  She has tried spacers which tends to help a little bit.  She wanted know if there are any other options.  Patient is on Eliquis  Objective:   There were no vitals filed for this visit. Podiatric Exam: Vascular: dorsalis pedis and posterior tibial pulses are palpable bilateral. Capillary return is immediate. Temperature gradient is WNL. Skin turgor WNL  Sensorium: Normal Semmes Weinstein monofilament test. Normal tactile sensation bilaterally. Nail Exam: Pt has thick disfigured discolored nails with subungual debris noted bilateral entire nail hallux through fifth toenails Ulcer Exam: There is no evidence of ulcer or pre-ulcerative changes or infection. Orthopedic Exam: Muscle tone and strength are WNL. No limitations in general ROM. No crepitus or effusions noted. HAV  B/L.  Hammer toes 2-5  B/L. Skin: No Porokeratosis. No infection or ulcers.  Mild ingrown noted of the right hallux without any paronychia or clinical signs of infection.  Heloma molle noted to bilateral first and second digit facing sides.  Mild pain on palpation  Assessment & Plan:  Patient was evaluated and treated and all questions answered.  Bilateral severe bunion deformity with hammertoe contractures of 2 through 5 -The spacer and offloading with shoe gear modification has helped tremendously.  Patient still has  some pain but much more manageable.  Patient just seems to continue to have the same discussion without any acute management.  She also has been noncompliant with conservative treatment options including applying toe pressure is appropriately.  Right hallux mild ingrown -I explained to the patient the etiology of ingrown and likely the hallux valgus rotation of the big toe into the second toe is still primary cause of the pain associated with this.  Unfortunately I will hold off on removal given patient's age and the pain associated with this has to do with the biomechanical deformity of the toe.  I believe she will benefit from a toe spacer.  Toe spacer was dispensed.  Bilateral first and second facing surface heloma molle -I explained to the patient the etiology, heloma molle and various treatment options were discussed.  I have encouraged her to continue using toe protectors and spacers at this time.  This is very mild in nature.  Patient states understanding  Onychomycosis with pain  -Nails palliatively debrided as below. -Educated on self-care  Procedure: Nail Debridement Rationale: pain  Type of Debridement: manual, sharp debridement. Instrumentation: Nail nipper, rotary burr. Number of Nails: 10  Procedures and Treatment: Consent by patient was obtained for treatment procedures. The patient understood the discussion of treatment and procedures well. All questions were answered thoroughly reviewed. Debridement of mycotic and hypertrophic toenails, 1 through 5 bilateral and clearing of subungual debris. No ulceration, no infection noted.  Return Visit-Office Procedure: Patient instructed to return to the office for a follow up visit 3 months for continued evaluation and treatment.  Boneta Lucks, DPM    No follow-ups on file.      Boneta Lucks, DPM    No follow-ups on file.

## 2020-09-29 ENCOUNTER — Telehealth: Payer: Self-pay | Admitting: Internal Medicine

## 2020-09-29 NOTE — Telephone Encounter (Signed)
Please advise 

## 2020-09-29 NOTE — Telephone Encounter (Signed)
Yes this was my patient 3 years ago  Ok to schedule appt

## 2020-09-29 NOTE — Telephone Encounter (Signed)
Patient's daughter [name] called. Patient is a former patient of Dr. Aundra Dubin from another office. Daughter states they have been trying to find Dr. Aundra Dubin. Would Dr. Aundra Dubin take patient as a new patient, please.

## 2020-10-07 ENCOUNTER — Other Ambulatory Visit: Payer: Self-pay

## 2020-10-07 ENCOUNTER — Encounter: Payer: Self-pay | Admitting: Internal Medicine

## 2020-10-07 ENCOUNTER — Ambulatory Visit (INDEPENDENT_AMBULATORY_CARE_PROVIDER_SITE_OTHER): Payer: Medicare Other | Admitting: Internal Medicine

## 2020-10-07 VITALS — BP 122/76 | HR 75 | Temp 98.2°F | Ht 66.0 in | Wt 129.4 lb

## 2020-10-07 DIAGNOSIS — Z1329 Encounter for screening for other suspected endocrine disorder: Secondary | ICD-10-CM

## 2020-10-07 DIAGNOSIS — E559 Vitamin D deficiency, unspecified: Secondary | ICD-10-CM

## 2020-10-07 DIAGNOSIS — Z86010 Personal history of colonic polyps: Secondary | ICD-10-CM

## 2020-10-07 DIAGNOSIS — G4700 Insomnia, unspecified: Secondary | ICD-10-CM

## 2020-10-07 DIAGNOSIS — I700 Atherosclerosis of aorta: Secondary | ICD-10-CM

## 2020-10-07 DIAGNOSIS — I8393 Asymptomatic varicose veins of bilateral lower extremities: Secondary | ICD-10-CM

## 2020-10-07 DIAGNOSIS — I824Y1 Acute embolism and thrombosis of unspecified deep veins of right proximal lower extremity: Secondary | ICD-10-CM | POA: Diagnosis not present

## 2020-10-07 DIAGNOSIS — R42 Dizziness and giddiness: Secondary | ICD-10-CM | POA: Diagnosis not present

## 2020-10-07 DIAGNOSIS — Z1389 Encounter for screening for other disorder: Secondary | ICD-10-CM

## 2020-10-07 DIAGNOSIS — Z0000 Encounter for general adult medical examination without abnormal findings: Secondary | ICD-10-CM

## 2020-10-07 DIAGNOSIS — I781 Nevus, non-neoplastic: Secondary | ICD-10-CM

## 2020-10-07 DIAGNOSIS — E785 Hyperlipidemia, unspecified: Secondary | ICD-10-CM

## 2020-10-07 NOTE — Progress Notes (Addendum)
Chief Complaint  Patient presents with  . Establish Care   Former pt at [facility] has been > 3 years so new patient  1. dx'ed 2021 fall right leg DVT and h/o spider veins and VV thigh and lower leg  appt [facility] upcoming 10/2020 11/17/20 with [place] repeat she was dx'ed with [facility] and had [place] there appt pending Dr. Delana Meyer  2. Flu shot declines  3 .dizziness daily since being on eliquis 5 mg bid she has samples of eliquis given by former PCP   Review of Systems  Constitutional: Negative for weight loss.  HENT: Negative for hearing loss.   Eyes: Negative for blurred vision.  Respiratory: Negative for shortness of breath.   Cardiovascular: Negative for chest pain.  Gastrointestinal: Negative for abdominal pain.  Musculoskeletal: Negative for falls.       Generalized pain 5/10   Skin: Negative for rash.  Neurological: Positive for dizziness.  Psychiatric/Behavioral: Negative for memory loss.   Past Medical History:  Diagnosis Date  . Abdominal pain   . Arthritis   . Bowel habit changes    pressure  . Cancer ([place])   . Colon polyps   . Depression   . Fibromyalgia   . Heart disease 2012  . History of fainting spells of unknown cause   . Inflammatory polyps of colon ([place])   . Poor appetite   . Right leg DVT ([place])    fall 09/2020 dx'ed [facility]  . Shortness of breath dyspnea   . Tinnitus   . Urinary urgency    Past Surgical History:  Procedure Laterality Date  . BREAST SURGERY    . ECTOPIC PREGNANCY SURGERY    . HYSTEROSCOPY WITH D & C N/A 07/23/2016   Procedure: DILATATION AND CURETTAGE;  Surgeon: Benjaman Kindler, MD;  Location: [facility] ORS;  Service: Gynecology;  Laterality: N/A;  . TUBAL LIGATION     Family History  Problem Relation Age of Onset  . Heart disease Mother   . Hypertension Mother   . Heart disease Father   . Hypertension Father    Social History   Socioeconomic History  . Marital status: Widowed    Spouse name: Not on file  . Number of  children: Not on file  . Years of education: Not on file  . Highest education level: Not on file  Occupational History  . Not on file  Tobacco Use  . Smoking status: Never Smoker  . Smokeless tobacco: Never Used  Substance and Sexual Activity  . Alcohol use: No    Alcohol/week: 0.0 standard drinks  . Drug use: No  . Sexual activity: Not Currently  Other Topics Concern  . Not on file  Social History Narrative   Lives with daughter [name]    Social Determinants of Health   Financial Resource Strain:   . Difficulty of Paying Living Expenses: Not on file  Food Insecurity:   . Worried About Charity fundraiser in the Last Year: Not on file  . Ran Out of Food in the Last Year: Not on file  Transportation Needs:   . Lack of Transportation (Medical): Not on file  . Lack of Transportation (Non-Medical): Not on file  Physical Activity:   . Days of Exercise per Week: Not on file  . Minutes of Exercise per Session: Not on file  Stress:   . Feeling of Stress : Not on file  Social Connections:   . Frequency of Communication with Friends and Family:  Not on file  . Frequency of Social Gatherings with Friends and Family: Not on file  . Attends Religious Services: Not on file  . Active Member of Clubs or Organizations: Not on file  . Attends Archivist Meetings: Not on file  . Marital Status: Not on file  Intimate Partner Violence:   . Fear of Current or Ex-Partner: Not on file  . Emotionally Abused: Not on file  . Physically Abused: Not on file  . Sexually Abused: Not on file   Current Meds  Medication Sig  . apixaban (ELIQUIS) 5 MG TABS tablet Take 5 mg by mouth 2 (two) times daily.  . Cholecalciferol (VITAMIN D3) 1000 units CAPS Take 1 capsule by mouth daily.  . Coenzyme Q10-Fish Oil-Vit E (CO-Q 10 OMEGA-3 FISH OIL) CAPS Take 2 capsules by mouth daily.   . NON FORMULARY [place] Blue Stuff   Allergies  Allergen Reactions  . Ciprofloxacin Nausea And Vomiting  .  Fish Allergy Other (See Comments)    Perch Fish  . Fish-Derived Products Other (See Comments)    Perch Fish  . Shellfish-Derived Products    No results found for this or any previous visit (from the past 2160 hour(s)). Objective  Body mass index is 20.89 kg/m. Wt Readings from Last 3 Encounters:  10/07/20 129 lb 6.4 oz (58.7 kg)  08/31/20 129 lb (58.5 kg)  06/23/20 125 lb (56.7 kg)   Temp Readings from Last 3 Encounters:  10/07/20 98.2 F (36.8 C) (Oral)  02/15/20 (!) 97 F (36.1 C)  12/01/19 (!) 97 F (36.1 C) (Axillary)   BP Readings from Last 3 Encounters:  10/07/20 122/76  08/31/20 (!) 157/73  06/23/20 (!) 157/72   Pulse Readings from Last 3 Encounters:  10/07/20 75  08/31/20 73  06/23/20 76    Physical Exam Vitals and nursing note reviewed.  Constitutional:      Appearance: Normal appearance. She is well-developed and well-groomed.  HENT:     Head: Normocephalic and atraumatic.  Eyes:     Conjunctiva/sclera: Conjunctivae normal.     Pupils: Pupils are equal, round, and reactive to light.  Cardiovascular:     Rate and Rhythm: Normal rate and regular rhythm.     Heart sounds: Normal heart sounds. No murmur heard.   Pulmonary:     Effort: Pulmonary effort is normal.     Breath sounds: Normal breath sounds.  Abdominal:     General: Abdomen is flat. Bowel sounds are normal.     Tenderness: There is no abdominal tenderness.  Skin:    General: Skin is warm and dry.  Neurological:     General: No focal deficit present.     Mental Status: She is alert and oriented to person, place, and time. Mental status is at baseline.     Gait: Gait normal.  Psychiatric:        Attention and Perception: Attention and perception normal.        Mood and Affect: Mood and affect normal.        Speech: Speech normal.        Behavior: Behavior normal. Behavior is cooperative.        Thought Content: Thought content normal.        Cognition and Memory: Cognition and memory  normal.        Judgment: Judgment normal.     Assessment  Plan  Dizziness ? orthostasis vs due to side effect of eliquis she assoc since  starting   Insomnia, unspecified type 3-4 hours at night  No need to tx for now chronic   History of colon polyps  Acute deep vein thrombosis (DVT) of proximal vein of right lower extremity (HCC)  SV and VV Repeat US and f/u 11/17/20 Dr. Ronalee Belts [facility]  HM Declines flu shot  [place] 2/2 disc booster  Reviewed 11/01/20 [facility] (vaccines prev declined vaccine, imaging I.e echo reviewed nl mild DD 11/2019, notes)   Out of age window colonoscopy (fobt neg x3 06/13/20), dexa, pap, mammogram    Consider labs and B12 with nex set of labs    Provider: Dr. Olivia Mackie McLean-Scocuzza-[facility]

## 2020-10-07 NOTE — Patient Instructions (Addendum)
[facility] 12/03/20 [facility]   Debrox ear wax drops 5-10 drops let sit x 5-10 minutes x 1 week 1x per month     Tinnitus Tinnitus refers to hearing a sound when there is no actual source for that sound. This is often described as ringing in the ears. However, people with this condition may hear a variety of noises, in one ear or in both ears. The sounds of tinnitus can be soft, loud, or somewhere in between. Tinnitus can last for a few seconds or can be constant for days. It may go away without treatment and come back at various times. When tinnitus is constant or happens often, it can lead to other problems, such as trouble sleeping and trouble concentrating. Almost everyone experiences tinnitus at some point. Tinnitus that is long-lasting (chronic) or comes back often (recurs) may require medical attention. What are the causes? The cause of tinnitus is often not known. In some cases, it can result from:  Exposure to loud noises from machinery, music, or other sources.  An object (foreign body) stuck in the ear.  Earwax buildup.  Drinking alcohol or caffeine.  Taking certain medicines.  Age-related hearing loss. It may also be caused by medical conditions such as:  Ear or sinus infections.  High blood pressure.  Heart diseases.  Anemia.  Allergies.  Meniere's disease.  Thyroid problems.  Tumors.  A weak, bulging blood vessel (aneurysm) near the ear. What are the signs or symptoms? The main symptom of tinnitus is hearing a sound when there is no source for that sound. It may sound like:  Buzzing.  Roaring.  Ringing.  Blowing air.  Hissing.  Whistling.  Sizzling.  Humming.  Running water.  A musical note.  Tapping. Symptoms may affect only one ear (unilateral) or both ears (bilateral). How is this diagnosed? Tinnitus is diagnosed based on your symptoms, your medical history, and a physical exam. Your health care provider may do a  thorough hearing test (audiologic exam) if your tinnitus:  Is unilateral.  Causes hearing difficulties.  Lasts 6 months or longer. You may work with a health care provider who specializes in hearing disorders (audiologist). You may be asked questions about your symptoms and how they affect your daily life. You may have other tests done, such as:  CT scan.  MRI.  An imaging test of how blood flows through your blood vessels (angiogram). How is this treated? Treating an underlying medical condition can sometimes make tinnitus go away. If your tinnitus continues, other treatments may include:  Medicines.  Therapy and counseling to help you manage the stress of living with tinnitus.  Sound generators to mask the tinnitus. These include: ? Tabletop sound machines that play relaxing sounds to help you fall asleep. ? Wearable devices that fit in your ear and play sounds or music. ? Acoustic neural stimulation. This involves using headphones to listen to music that contains an auditory signal. Over time, listening to this signal may change some pathways in your brain and make you less sensitive to tinnitus. This treatment is used for very severe cases when no other treatment is working.  Using hearing aids or cochlear implants if your tinnitus is related to hearing loss. Hearing aids are worn in the outer ear. Cochlear implants are surgically placed in the inner ear. Follow these instructions at home: Managing symptoms      When possible, avoid being in loud places and being exposed to loud sounds.  Wear  hearing protection, such as earplugs, when you are exposed to loud noises.  Use a white noise machine, a humidifier, or other devices to mask the sound of tinnitus.  Practice techniques for reducing stress, such as meditation, yoga, or deep breathing. Work with your health care provider if you need help with managing stress.  Sleep with your head slightly raised. This may reduce the  impact of tinnitus. General instructions  Do not use stimulants, such as nicotine, alcohol, or caffeine. Talk with your health care provider about other stimulants to avoid. Stimulants are substances that can make you feel alert and attentive by increasing certain activities in the body (such as heart rate and blood pressure). These substances may make tinnitus worse.  Take over-the-counter and prescription medicines only as told by your health care provider.  Try to get plenty of sleep each night.  Keep all follow-up visits as told by your health care provider. This is important. Contact a health care provider if:  Your tinnitus continues for 3 weeks or longer without stopping.  You develop sudden hearing loss.  Your symptoms get worse or do not get better with home care.  You feel you are not able to manage the stress of living with tinnitus. Get help right away if:  You develop tinnitus after a head injury.  You have tinnitus along with any of the following: ? Dizziness. ? Loss of balance. ? Nausea and vomiting. ? Sudden, severe headache. These symptoms may represent a serious problem that is an emergency. Do not wait to see if the symptoms will go away. Get medical help right away. Call your local emergency services (911 in the [place] Do not drive yourself to the hospital. Summary  Tinnitus refers to hearing a sound when there is no actual source for that sound. This is often described as ringing in the ears.  Symptoms may affect only one ear (unilateral) or both ears (bilateral).  Use a white noise machine, a humidifier, or other devices to mask the sound of tinnitus.  Do not use stimulants, such as nicotine, alcohol, or caffeine. Talk with your health care provider about other stimulants to avoid. These substances may make tinnitus worse. This information is not intended to replace advice given to you by your health care provider. Make sure you discuss any questions you  have with your health care provider. Document Revised: 05/27/2019 Document Reviewed: 08/22/2017 Elsevier Patient Education  2020 [place], Adult The ears produce a substance called earwax that helps keep bacteria out of the ear and protects the skin in the ear canal. Occasionally, earwax can build up in the ear and cause discomfort or hearing loss. What increases the risk? This condition is more likely to develop in people who:  Are female.  Are elderly.  Naturally produce more earwax.  Clean their ears often with cotton swabs.  Use earplugs often.  Use in-ear headphones often.  Wear hearing aids.  Have narrow ear canals.  Have earwax that is overly thick or sticky.  Have eczema.  Are dehydrated.  Have excess hair in the ear canal. What are the signs or symptoms? Symptoms of this condition include:  Reduced or muffled hearing.  A feeling of fullness in the ear or feeling that the ear is plugged.  Fluid coming from the ear.  Ear pain.  Ear itch.  Ringing in the ear.  Coughing.  An obvious piece of earwax that can be seen inside the ear canal.  How is this diagnosed? This condition may be diagnosed based on:  Your symptoms.  Your medical history.  An ear exam. During the exam, your health care provider will look into your ear with an instrument called an otoscope. You may have tests, including a hearing test. How is this treated? This condition may be treated by:  Using ear drops to soften the earwax.  Having the earwax removed by a health care provider. The health care provider may: ? Flush the ear with water. ? Use an instrument that has a loop on the end (curette). ? Use a suction device.  Surgery to remove the wax buildup. This may be done in severe cases. Follow these instructions at home:   Take over-the-counter and prescription medicines only as told by your health care provider.  Do not put any objects, including  cotton swabs, into your ear. You can clean the opening of your ear canal with a washcloth or facial tissue.  Follow instructions from your health care provider about cleaning your ears. Do not over-clean your ears.  Drink enough fluid to keep your urine clear or pale yellow. This will help to thin the earwax.  Keep all follow-up visits as told by your health care provider. If earwax builds up in your ears often or if you use hearing aids, consider seeing your health care provider for routine, preventive ear cleanings. Ask your health care provider how often you should schedule your cleanings.  If you have hearing aids, clean them according to instructions from the manufacturer and your health care provider. Contact a health care provider if:  You have ear pain.  You develop a fever.  You have blood, pus, or other fluid coming from your ear.  You have hearing loss.  You have ringing in your ears that does not go away.  Your symptoms do not improve with treatment.  You feel like the room is spinning (vertigo). Summary  Earwax can build up in the ear and cause discomfort or hearing loss.  The most common symptoms of this condition include reduced or muffled hearing and a feeling of fullness in the ear or feeling that the ear is plugged.  This condition may be diagnosed based on your symptoms, your medical history, and an ear exam.  This condition may be treated by using ear drops to soften the earwax or by having the earwax removed by a health care provider.  Do not put any objects, including cotton swabs, into your ear. You can clean the opening of your ear canal with a washcloth or facial tissue. This information is not intended to replace advice given to you by your health care provider. Make sure you discuss any questions you have with your health care provider. Document Revised: 10/25/2017 Document Reviewed: 01/23/2017 Elsevier Patient Education  2020 [employer].

## 2020-10-11 ENCOUNTER — Encounter: Payer: Self-pay | Admitting: Internal Medicine

## 2020-10-11 DIAGNOSIS — I8393 Asymptomatic varicose veins of bilateral lower extremities: Secondary | ICD-10-CM | POA: Insufficient documentation

## 2020-10-11 DIAGNOSIS — I781 Nevus, non-neoplastic: Secondary | ICD-10-CM

## 2020-10-11 DIAGNOSIS — G4700 Insomnia, unspecified: Secondary | ICD-10-CM

## 2020-10-11 DIAGNOSIS — Z860100 Personal history of colon polyps, unspecified: Secondary | ICD-10-CM

## 2020-10-11 HISTORY — DX: Personal history of colon polyps, unspecified: Z86.0100

## 2020-10-11 HISTORY — DX: Asymptomatic varicose veins of bilateral lower extremities: I83.93

## 2020-10-11 HISTORY — DX: Nevus, non-neoplastic: I78.1

## 2020-10-11 HISTORY — DX: Insomnia, unspecified: G47.00

## 2020-10-11 HISTORY — DX: Personal history of colonic polyps: Z86.010

## 2020-10-24 ENCOUNTER — Ambulatory Visit (INDEPENDENT_AMBULATORY_CARE_PROVIDER_SITE_OTHER): Payer: Medicare Other | Admitting: Nurse Practitioner

## 2020-10-24 ENCOUNTER — Other Ambulatory Visit: Payer: Self-pay

## 2020-10-24 ENCOUNTER — Encounter (INDEPENDENT_AMBULATORY_CARE_PROVIDER_SITE_OTHER): Payer: Self-pay | Admitting: Nurse Practitioner

## 2020-10-24 ENCOUNTER — Ambulatory Visit (INDEPENDENT_AMBULATORY_CARE_PROVIDER_SITE_OTHER): Payer: Medicare Other

## 2020-10-24 VITALS — BP 134/65 | HR 72 | Resp 16 | Wt 126.0 lb

## 2020-10-24 DIAGNOSIS — M8949 Other hypertrophic osteoarthropathy, multiple sites: Secondary | ICD-10-CM

## 2020-10-24 DIAGNOSIS — E785 Hyperlipidemia, unspecified: Secondary | ICD-10-CM

## 2020-10-24 DIAGNOSIS — I2510 Atherosclerotic heart disease of native coronary artery without angina pectoris: Secondary | ICD-10-CM

## 2020-10-24 DIAGNOSIS — M159 Polyosteoarthritis, unspecified: Secondary | ICD-10-CM

## 2020-10-24 DIAGNOSIS — I82433 Acute embolism and thrombosis of popliteal vein, bilateral: Secondary | ICD-10-CM | POA: Diagnosis not present

## 2020-10-27 ENCOUNTER — Encounter (INDEPENDENT_AMBULATORY_CARE_PROVIDER_SITE_OTHER): Payer: Self-pay | Admitting: Nurse Practitioner

## 2020-10-27 NOTE — Progress Notes (Signed)
Subjective:    Patient ID: Carmen Humphrey, female    DOB: [birth year], 86 y.o.   MRN: [id number] Chief Complaint  Patient presents with  . Follow-up    30month ultrasound followup    The patient presents today for follow-up evaluation of bilateral popliteal DVTs.  The patient was placed on Eliquis upon discovering these DVTs.  Due to her advanced age she was placed on a reduced dose.  However, compliance with Eliquis has been difficult as the patient believes that the Eliquis was causing severe side effects, although none are observed.  The patient is known to have history of dementia and this is confirmed by her family that is present at the office visit.  They are also able to provide history as well.  They deny any fever, chills, nausea, vomiting or diarrhea.  Denies any extensive episodes of bleeding.   Today noninvasive study showed no evidence of DVT or superficial venous thrombosis in the bilateral lower extremities.   Review of Systems  Hematological: Bruises/bleeds easily.  All other systems reviewed and are negative.      Objective:   Physical Exam Vitals reviewed.  HENT:     Head: Normocephalic.  Cardiovascular:     Rate and Rhythm: Normal rate.     Pulses: Normal pulses.  Pulmonary:     Effort: Pulmonary effort is normal.  Skin:    General: Skin is warm and dry.  Neurological:     Mental Status: She is alert and oriented to person, place, and time.  Psychiatric:        Attention and Perception: Attention normal.        Mood and Affect: Mood normal.        Behavior: Behavior normal.        Thought Content: Thought content is paranoid.        Cognition and Memory: Cognition is impaired. Memory is impaired.        Judgment: Judgment normal.     BP 134/65 (BP Location: Right Arm)   Pulse 72   Resp 16   Wt 126 lb (57.2 kg)   BMI 20.34 kg/m   Past Medical History:  Diagnosis Date  . Abdominal pain   . Arthritis   . Bowel habit changes    pressure  .  Cancer ([place])   . Colon polyps   . Depression   . Fibromyalgia   . Heart disease 2012  . History of fainting spells of unknown cause   . Inflammatory polyps of colon ([place])   . Poor appetite   . Right leg DVT ([place])    fall 09/2020 dx'ed [facility]  . Shortness of breath dyspnea   . Tinnitus   . Urinary urgency     Social History   Socioeconomic History  . Marital status: Widowed    Spouse name: Not on file  . Number of children: Not on file  . Years of education: Not on file  . Highest education level: Not on file  Occupational History  . Not on file  Tobacco Use  . Smoking status: Never Smoker  . Smokeless tobacco: Never Used  Substance and Sexual Activity  . Alcohol use: No    Alcohol/week: 0.0 standard drinks  . Drug use: No  . Sexual activity: Not Currently  Other Topics Concern  . Not on file  Social History Narrative   Lives with daughter [name]    Social Determinants of Health   Financial Resource Strain:   .  Difficulty of Paying Living Expenses: Not on file  Food Insecurity:   . Worried About Charity fundraiser in the Last Year: Not on file  . Ran Out of Food in the Last Year: Not on file  Transportation Needs:   . Lack of Transportation (Medical): Not on file  . Lack of Transportation (Non-Medical): Not on file  Physical Activity:   . Days of Exercise per Week: Not on file  . Minutes of Exercise per Session: Not on file  Stress:   . Feeling of Stress : Not on file  Social Connections:   . Frequency of Communication with Friends and Family: Not on file  . Frequency of Social Gatherings with Friends and Family: Not on file  . Attends Religious Services: Not on file  . Active Member of Clubs or Organizations: Not on file  . Attends Archivist Meetings: Not on file  . Marital Status: Not on file  Intimate Partner Violence:   . Fear of Current or Ex-Partner: Not on file  . Emotionally Abused: Not on file  . Physically  Abused: Not on file  . Sexually Abused: Not on file    Past Surgical History:  Procedure Laterality Date  . BREAST SURGERY    . ECTOPIC PREGNANCY SURGERY    . HYSTEROSCOPY WITH D & C N/A 07/23/2016   Procedure: DILATATION AND CURETTAGE;  Surgeon: Benjaman Kindler, MD;  Location: [facility] ORS;  Service: Gynecology;  Laterality: N/A;  . TUBAL LIGATION      Family History  Problem Relation Age of Onset  . Heart disease Mother   . Hypertension Mother   . Heart disease Father   . Hypertension Father     Allergies  Allergen Reactions  . Ciprofloxacin Nausea And Vomiting  . Fish Allergy Other (See Comments)    Perch Fish  . Fish-Derived Products Other (See Comments)    Perch Fish  . Shellfish-Derived Products     CBC Latest Ref Rng & Units 11/30/2019 12/06/2017 07/19/2016  WBC 4.0 - 10.5 K/uL 5.6 5.1 5.7  Hemoglobin 12.0 - 15.0 g/dL 12.7 13.3 13.8  Hematocrit 36 - 46 % 37.5 39.2 39.6  Platelets 150 - 400 K/uL 297 291 247      CMP     Component Value Date/Time   NA 134 (L) 11/30/2019 1824   K 3.7 11/30/2019 1824   CL 102 11/30/2019 1824   CO2 21 (L) 11/30/2019 1824   GLUCOSE 146 (H) 11/30/2019 1824   BUN 10 11/30/2019 1824   CREATININE 0.66 11/30/2019 1824   CALCIUM 9.0 11/30/2019 1824   PROT 6.9 11/30/2019 1824   ALBUMIN 3.7 11/30/2019 1824   AST 27 11/30/2019 1824   ALT 21 11/30/2019 1824   ALKPHOS 79 11/30/2019 1824   BILITOT 0.8 11/30/2019 1824   GFRNONAA >60 11/30/2019 1824   GFRAA >60 11/30/2019 1824     No results found.     Assessment & Plan:   1. Acute deep vein thrombosis (DVT) of popliteal vein of both lower extremities (HCC) No evidence of DVT seen today on ultrasound.  Based on this we will allow the patient to stop her Eliquis.  The patient is also advised to continue to utilize medical grade compression to stop postphlebitic swelling.  They are also advised of signs symptoms of DVT and if the signs symptoms occur they should contact her office for  follow-up or proceed to the emergency room for evaluation.  We will have the patient  follow-up on an as-needed basis.  2. Dyslipidemia Continue statin as ordered and reviewed, no changes at this time   3. Primary osteoarthritis involving multiple joints Continue NSAID medications as already ordered, these medications have been reviewed and there are no changes at this time.  Continued activity and therapy was stressed.    Current Outpatient Medications on File Prior to Visit  Medication Sig Dispense Refill  . apixaban (ELIQUIS) 5 MG TABS tablet Take 5 mg by mouth 2 (two) times daily.    . Cholecalciferol (VITAMIN D3) 1000 units CAPS Take 1 capsule by mouth daily.    . NON FORMULARY [place] Blue Stuff    . Coenzyme Q10-Fish Oil-Vit E (CO-Q 10 OMEGA-3 FISH OIL) CAPS Take 2 capsules by mouth daily.  (Patient not taking: Reported on 10/24/2020)    . psyllium (METAMUCIL) 58.6 % powder Take 1 packet by mouth daily as needed. (Patient not taking: Reported on 08/31/2020)     No current facility-administered medications on file prior to visit.    There are no Patient Instructions on file for this visit. No follow-ups on file.   Kris Hartmann, NP

## 2020-11-01 ENCOUNTER — Other Ambulatory Visit: Payer: Self-pay | Admitting: Internal Medicine

## 2020-11-01 ENCOUNTER — Encounter: Payer: Self-pay | Admitting: Internal Medicine

## 2020-11-01 ENCOUNTER — Telehealth: Payer: Self-pay | Admitting: Internal Medicine

## 2020-11-01 ENCOUNTER — Telehealth: Payer: Self-pay

## 2020-11-01 DIAGNOSIS — I700 Atherosclerosis of aorta: Secondary | ICD-10-CM | POA: Insufficient documentation

## 2020-11-01 NOTE — Telephone Encounter (Signed)
Call pt and sch fasting labs asap   Thanks [place]

## 2020-11-01 NOTE — Telephone Encounter (Signed)
ERR

## 2020-11-01 NOTE — Addendum Note (Signed)
Addended by: Orland Mustard on: 11/01/2020 11:16 AM   Modules accepted: Orders

## 2020-11-03 ENCOUNTER — Other Ambulatory Visit: Payer: Self-pay

## 2020-11-03 ENCOUNTER — Other Ambulatory Visit (INDEPENDENT_AMBULATORY_CARE_PROVIDER_SITE_OTHER): Payer: Medicare Other

## 2020-11-03 DIAGNOSIS — R42 Dizziness and giddiness: Secondary | ICD-10-CM | POA: Diagnosis not present

## 2020-11-03 DIAGNOSIS — Z1329 Encounter for screening for other suspected endocrine disorder: Secondary | ICD-10-CM | POA: Diagnosis not present

## 2020-11-03 DIAGNOSIS — Z0000 Encounter for general adult medical examination without abnormal findings: Secondary | ICD-10-CM

## 2020-11-03 DIAGNOSIS — E785 Hyperlipidemia, unspecified: Secondary | ICD-10-CM

## 2020-11-03 DIAGNOSIS — Z1389 Encounter for screening for other disorder: Secondary | ICD-10-CM

## 2020-11-03 DIAGNOSIS — E559 Vitamin D deficiency, unspecified: Secondary | ICD-10-CM

## 2020-11-03 DIAGNOSIS — I700 Atherosclerosis of aorta: Secondary | ICD-10-CM | POA: Diagnosis not present

## 2020-11-03 LAB — COMPREHENSIVE METABOLIC PANEL
ALT: 15 U/L (ref 0–35)
AST: 19 U/L (ref 0–37)
Albumin: 3.8 g/dL (ref 3.5–5.2)
Alkaline Phosphatase: 90 U/L (ref 39–117)
BUN: 8 mg/dL (ref 6–23)
CO2: 27 mEq/L (ref 19–32)
Calcium: 9 mg/dL (ref 8.4–10.5)
Chloride: 99 mEq/L (ref 96–112)
Creatinine, Ser: 0.61 mg/dL (ref 0.40–1.20)
GFR: 76.86 mL/min (ref >60.00)
Glucose, Bld: 81 mg/dL (ref 70–99)
Potassium: 4.3 mEq/L (ref 3.5–5.1)
Sodium: 134 mEq/L — ABNORMAL LOW (ref 135–145)
Total Bilirubin: 1.2 mg/dL (ref 0.2–1.2)
Total Protein: 6.4 g/dL (ref 6.0–8.3)

## 2020-11-03 LAB — CBC WITH DIFFERENTIAL/PLATELET
Basophils Absolute: 0.1 10*3/uL (ref 0.0–0.1)
Basophils Relative: 1.1 % (ref 0.0–3.0)
Eosinophils Absolute: 0 10*3/uL (ref 0.0–0.7)
Eosinophils Relative: 0.8 % (ref 0.0–5.0)
HCT: 39.3 % (ref 36.0–46.0)
Hemoglobin: 13.2 g/dL (ref 12.0–15.0)
Lymphocytes Relative: 19.3 % (ref 12.0–46.0)
Lymphs Abs: 1.2 10*3/uL (ref 0.7–4.0)
MCHC: 33.5 g/dL (ref 30.0–36.0)
MCV: 89.1 fl (ref 78.0–100.0)
Monocytes Absolute: 0.5 10*3/uL (ref 0.1–1.0)
Monocytes Relative: 7.6 % (ref 3.0–12.0)
Neutro Abs: 4.4 10*3/uL (ref 1.4–7.7)
Neutrophils Relative %: 71.2 % (ref 43.0–77.0)
Platelets: 263 10*3/uL (ref 150.0–400.0)
RBC: 4.41 Mil/uL (ref 3.87–5.11)
RDW: 13.2 % (ref 11.5–15.5)
WBC: 6.2 10*3/uL (ref 4.0–10.5)

## 2020-11-03 LAB — VITAMIN D 25 HYDROXY (VIT D DEFICIENCY, FRACTURES): VITD: 37.33 ng/mL (ref 30.00–100.00)

## 2020-11-03 LAB — LIPID PANEL
Cholesterol: 237 mg/dL — ABNORMAL HIGH (ref 0–200)
HDL: 68 mg/dL (ref >39.00)
LDL Cholesterol: 152 mg/dL — ABNORMAL HIGH (ref 0–99)
NonHDL: 168.67
Total CHOL/HDL Ratio: 3
Triglycerides: 82 mg/dL (ref 0.0–149.0)
VLDL: 16.4 mg/dL (ref 0.0–40.0)

## 2020-11-03 LAB — TSH: TSH: 5.38 u[IU]/mL — ABNORMAL HIGH (ref 0.35–4.50)

## 2020-11-04 LAB — URINALYSIS, ROUTINE W REFLEX MICROSCOPIC
Bilirubin Urine: NEGATIVE
Glucose, UA: NEGATIVE
Hgb urine dipstick: NEGATIVE
Ketones, ur: NEGATIVE
Leukocytes,Ua: NEGATIVE
Nitrite: NEGATIVE
Protein, ur: NEGATIVE
Specific Gravity, Urine: 1.008 (ref 1.001–1.03)
pH: 7 (ref 5.0–8.0)

## 2020-11-07 ENCOUNTER — Other Ambulatory Visit: Payer: Self-pay | Admitting: Internal Medicine

## 2020-11-07 DIAGNOSIS — K146 Glossodynia: Secondary | ICD-10-CM

## 2020-11-07 DIAGNOSIS — E859 Amyloidosis, unspecified: Secondary | ICD-10-CM

## 2020-11-07 DIAGNOSIS — R946 Abnormal results of thyroid function studies: Secondary | ICD-10-CM

## 2020-11-07 DIAGNOSIS — M3390 Dermatopolymyositis, unspecified, organ involvement unspecified: Secondary | ICD-10-CM

## 2020-11-07 DIAGNOSIS — E538 Deficiency of other specified B group vitamins: Secondary | ICD-10-CM

## 2020-11-07 DIAGNOSIS — M3313 Other dermatomyositis without myopathy: Secondary | ICD-10-CM

## 2020-11-07 HISTORY — DX: Abnormal results of thyroid function studies: R94.6

## 2020-11-09 ENCOUNTER — Encounter: Payer: Self-pay | Admitting: Internal Medicine

## 2020-11-09 ENCOUNTER — Ambulatory Visit (INDEPENDENT_AMBULATORY_CARE_PROVIDER_SITE_OTHER): Payer: Medicare Other | Admitting: Internal Medicine

## 2020-11-09 ENCOUNTER — Other Ambulatory Visit: Payer: Self-pay

## 2020-11-09 VITALS — BP 124/82 | HR 70 | Temp 97.9°F | Ht 66.0 in | Wt 127.4 lb

## 2020-11-09 DIAGNOSIS — M3390 Dermatopolymyositis, unspecified, organ involvement unspecified: Secondary | ICD-10-CM

## 2020-11-09 DIAGNOSIS — M79676 Pain in unspecified toe(s): Secondary | ICD-10-CM

## 2020-11-09 DIAGNOSIS — R413 Other amnesia: Secondary | ICD-10-CM | POA: Diagnosis not present

## 2020-11-09 DIAGNOSIS — K5900 Constipation, unspecified: Secondary | ICD-10-CM | POA: Diagnosis not present

## 2020-11-09 DIAGNOSIS — H9313 Tinnitus, bilateral: Secondary | ICD-10-CM

## 2020-11-09 DIAGNOSIS — K146 Glossodynia: Secondary | ICD-10-CM

## 2020-11-09 HISTORY — DX: Other amnesia: R41.3

## 2020-11-09 NOTE — Patient Instructions (Addendum)
Consider foot massager with dove antibacterial soap and epsom salt  Consider ENT for ringing in your ears and hearing test    Senna/colace consider for constipation  Warm prune  Tinnitus Tinnitus refers to hearing a sound when there is no actual source for that sound. This is often described as ringing in the ears. However, people with this condition may hear a variety of noises, in one ear or in both ears. The sounds of tinnitus can be soft, loud, or somewhere in between. Tinnitus can last for a few seconds or can be constant for days. It may go away without treatment and come back at various times. When tinnitus is constant or happens often, it can lead to other problems, such as trouble sleeping and trouble concentrating. Almost everyone experiences tinnitus at some point. Tinnitus that is long-lasting (chronic) or comes back often (recurs) may require medical attention. What are the causes? The cause of tinnitus is often not known. In some cases, it can result from:  Exposure to loud noises from machinery, music, or other sources.  An object (foreign body) stuck in the ear.  Earwax buildup.  Drinking alcohol or caffeine.  Taking certain medicines.  Age-related hearing loss. It may also be caused by medical conditions such as:  Ear or sinus infections.  High blood pressure.  Heart diseases.  Anemia.  Allergies.  Meniere's disease.  Thyroid problems.  Tumors.  A weak, bulging blood vessel (aneurysm) near the ear. What are the signs or symptoms? The main symptom of tinnitus is hearing a sound when there is no source for that sound. It may sound like:  Buzzing.  Roaring.  Ringing.  Blowing air.  Hissing.  Whistling.  Sizzling.  Humming.  Running water.  A musical note.  Tapping. Symptoms may affect only one ear (unilateral) or both ears (bilateral). How is this diagnosed? Tinnitus is diagnosed based on your symptoms, your medical history, and a  physical exam. Your health care provider may do a thorough hearing test (audiologic exam) if your tinnitus:  Is unilateral.  Causes hearing difficulties.  Lasts 6 months or longer. You may work with a health care provider who specializes in hearing disorders (audiologist). You may be asked questions about your symptoms and how they affect your daily life. You may have other tests done, such as:  CT scan.  MRI.  An imaging test of how blood flows through your blood vessels (angiogram). How is this treated? Treating an underlying medical condition can sometimes make tinnitus go away. If your tinnitus continues, other treatments may include:  Medicines.  Therapy and counseling to help you manage the stress of living with tinnitus.  Sound generators to mask the tinnitus. These include: ? Tabletop sound machines that play relaxing sounds to help you fall asleep. ? Wearable devices that fit in your ear and play sounds or music. ? Acoustic neural stimulation. This involves using headphones to listen to music that contains an auditory signal. Over time, listening to this signal may change some pathways in your brain and make you less sensitive to tinnitus. This treatment is used for very severe cases when no other treatment is working.  Using hearing aids or cochlear implants if your tinnitus is related to hearing loss. Hearing aids are worn in the outer ear. Cochlear implants are surgically placed in the inner ear. Follow these instructions at home: Managing symptoms      When possible, avoid being in loud places and being exposed to loud sounds.  Wear hearing protection, such as earplugs, when you are exposed to loud noises.  Use a white noise machine, a humidifier, or other devices to mask the sound of tinnitus.  Practice techniques for reducing stress, such as meditation, yoga, or deep breathing. Work with your health care provider if you need help with managing stress.  Sleep  with your head slightly raised. This may reduce the impact of tinnitus. General instructions  Do not use stimulants, such as nicotine, alcohol, or caffeine. Talk with your health care provider about other stimulants to avoid. Stimulants are substances that can make you feel alert and attentive by increasing certain activities in the body (such as heart rate and blood pressure). These substances may make tinnitus worse.  Take over-the-counter and prescription medicines only as told by your health care provider.  Try to get plenty of sleep each night.  Keep all follow-up visits as told by your health care provider. This is important. Contact a health care provider if:  Your tinnitus continues for 3 weeks or longer without stopping.  You develop sudden hearing loss.  Your symptoms get worse or do not get better with home care.  You feel you are not able to manage the stress of living with tinnitus. Get help right away if:  You develop tinnitus after a head injury.  You have tinnitus along with any of the following: ? Dizziness. ? Loss of balance. ? Nausea and vomiting. ? Sudden, severe headache. These symptoms may represent a serious problem that is an emergency. Do not wait to see if the symptoms will go away. Get medical help right away. Call your local emergency services (911 in the [place] Do not drive yourself to the hospital. Summary  Tinnitus refers to hearing a sound when there is no actual source for that sound. This is often described as ringing in the ears.  Symptoms may affect only one ear (unilateral) or both ears (bilateral).  Use a white noise machine, a humidifier, or other devices to mask the sound of tinnitus.  Do not use stimulants, such as nicotine, alcohol, or caffeine. Talk with your health care provider about other stimulants to avoid. These substances may make tinnitus worse. This information is not intended to replace advice given to you by your health care  provider. Make sure you discuss any questions you have with your health care provider. Document Revised: 05/27/2019 Document Reviewed: 08/22/2017 Elsevier Patient Education  [place].  High Cholesterol  High cholesterol is a condition in which the blood has high levels of a white, waxy, fat-like substance (cholesterol). The human body needs small amounts of cholesterol. The liver makes all the cholesterol that the body needs. Extra (excess) cholesterol comes from the food that we eat. Cholesterol is carried from the liver by the blood through the blood vessels. If you have high cholesterol, deposits (plaques) may build up on the walls of your blood vessels (arteries). Plaques make the arteries narrower and stiffer. Cholesterol plaques increase your risk for heart attack and stroke. Work with your health care provider to keep your cholesterol levels in a healthy range. What increases the risk? This condition is more likely to develop in people who:  Eat foods that are high in animal fat (saturated fat) or cholesterol.  Are overweight.  Are not getting enough exercise.  Have a family history of high cholesterol. What are the signs or symptoms? There are no symptoms of this condition. How is this diagnosed? This condition may be  diagnosed from the results of a blood test.  If you are older than age 52, your health care provider may check your cholesterol every 4-6 years.  You may be checked more often if you already have high cholesterol or other risk factors for heart disease. The blood test for cholesterol measures:  "Bad" cholesterol (LDL cholesterol). This is the main type of cholesterol that causes heart disease. The desired level for LDL is less than 100.  "Good" cholesterol (HDL cholesterol). This type helps to protect against heart disease by cleaning the arteries and carrying the LDL away. The desired level for HDL is 60 or higher.  Triglycerides. These are fats that  the body can store or burn for energy. The desired number for triglycerides is lower than 150.  Total cholesterol. This is a measure of the total amount of cholesterol in your blood, including LDL cholesterol, HDL cholesterol, and triglycerides. A healthy number is less than 200. How is this treated? This condition is treated with diet changes, lifestyle changes, and medicines. Diet changes  This may include eating more whole grains, fruits, vegetables, nuts, and fish.  This may also include cutting back on red meat and foods that have a lot of added sugar. Lifestyle changes  Changes may include getting at least 40 minutes of aerobic exercise 3 times a week. Aerobic exercises include walking, biking, and swimming. Aerobic exercise along with a healthy diet can help you maintain a healthy weight.  Changes may also include quitting smoking. Medicines  Medicines are usually given if diet and lifestyle changes have failed to reduce your cholesterol to healthy levels.  Your health care provider may prescribe a statin medicine. Statin medicines have been shown to reduce cholesterol, which can reduce the risk of heart disease. Follow these instructions at home: Eating and drinking If told by your health care provider:  Eat chicken (without skin), fish, veal, shellfish, ground [place] breast, and round or loin cuts of red meat.  Do not eat fried foods or fatty meats, such as hot dogs and salami.  Eat plenty of fruits, such as apples.  Eat plenty of vegetables, such as broccoli, potatoes, and carrots.  Eat beans, peas, and lentils.  Eat grains such as barley, rice, couscous, and bulgur wheat.  Eat pasta without cream sauces.  Use skim or nonfat milk, and eat low-fat or nonfat yogurt and cheeses.  Do not eat or drink whole milk, cream, ice cream, egg yolks, or hard cheeses.  Do not eat stick margarine or tub margarines that contain trans fats (also called partially hydrogenated  oils).  Do not eat saturated tropical oils, such as coconut oil and palm oil.  Do not eat cakes, cookies, crackers, or other baked goods that contain trans fats.  General instructions  Exercise as directed by your health care provider. Increase your activity level with activities such as gardening, walking, and taking the stairs.  Take over-the-counter and prescription medicines only as told by your health care provider.  Do not use any products that contain nicotine or tobacco, such as cigarettes and e-cigarettes. If you need help quitting, ask your health care provider.  Keep all follow-up visits as told by your health care provider. This is important. Contact a health care provider if:  You are struggling to maintain a healthy diet or weight.  You need help to start on an exercise program.  You need help to stop smoking. Get help right away if:  You have chest pain.  You  have trouble breathing. This information is not intended to replace advice given to you by your health care provider. Make sure you discuss any questions you have with your health care provider. Document Revised: 11/15/2017 Document Reviewed: 05/12/2016 Elsevier Patient Education  [place].  Cholesterol Content in Foods Cholesterol is a waxy, fat-like substance that helps to carry fat in the blood. The body needs cholesterol in small amounts, but too much cholesterol can cause damage to the arteries and heart. Most people should eat less than 200 milligrams (mg) of cholesterol a day. Foods with cholesterol  Cholesterol is found in animal-based foods, such as meat, seafood, and dairy. Generally, low-fat dairy and lean meats have less cholesterol than full-fat dairy and fatty meats. The milligrams of cholesterol per serving (mg per serving) of common cholesterol-containing foods are listed below. Meat and other proteins  Egg -- one large whole egg has 186 mg.  Veal shank -- 4 oz has 141 mg.  Lean  ground [place] (93% lean) -- 4 oz has 118 mg.  Fat-trimmed lamb loin -- 4 oz has 106 mg.  Lean ground beef (90% lean) -- 4 oz has 100 mg.  Lobster -- 3.5 oz has 90 mg.  Pork loin chops -- 4 oz has 86 mg.  Canned salmon -- 3.5 oz has 83 mg.  Fat-trimmed beef top loin -- 4 oz has 78 mg.  Frankfurter -- 1 frank (3.5 oz) has 77 mg.  Crab -- 3.5 oz has 71 mg.  Roasted chicken without skin, white meat -- 4 oz has 66 mg.  Light bologna -- 2 oz has 45 mg.  Deli-cut [place] -- 2 oz has 31 mg.  Canned tuna -- 3.5 oz has 31 mg.  Berniece Salines -- 1 oz has 29 mg.  Oysters and mussels (raw) -- 3.5 oz has 25 mg.  Mackerel -- 1 oz has 22 mg.  Trout -- 1 oz has 20 mg.  Pork sausage -- 1 link (1 oz) has 17 mg.  Salmon -- 1 oz has 16 mg.  Tilapia -- 1 oz has 14 mg. Dairy  Soft-serve ice cream --  cup (4 oz) has 103 mg.  Whole-milk yogurt -- 1 cup (8 oz) has 29 mg.  Cheddar cheese -- 1 oz has 28 mg.  American cheese -- 1 oz has 28 mg.  Whole milk -- 1 cup (8 oz) has 23 mg.  2% milk -- 1 cup (8 oz) has 18 mg.  Cream cheese -- 1 tablespoon (Tbsp) has 15 mg.  Cottage cheese --  cup (4 oz) has 14 mg.  Low-fat (1%) milk -- 1 cup (8 oz) has 10 mg.  Sour cream -- 1 Tbsp has 8.5 mg.  Low-fat yogurt -- 1 cup (8 oz) has 8 mg.  Nonfat Greek yogurt -- 1 cup (8 oz) has 7 mg.  Half-and-half cream -- 1 Tbsp has 5 mg. Fats and oils  Cod liver oil -- 1 tablespoon (Tbsp) has 82 mg.  Butter -- 1 Tbsp has 15 mg.  Lard -- 1 Tbsp has 14 mg.  Bacon grease -- 1 Tbsp has 14 mg.  Mayonnaise -- 1 Tbsp has 5-10 mg.  Margarine -- 1 Tbsp has 3-10 mg. Exact amounts of cholesterol in these foods may vary depending on specific ingredients and brands. Foods without cholesterol Most plant-based foods do not have cholesterol unless you combine them with a food that has cholesterol. Foods without cholesterol include:  Grains and cereals.  Vegetables.  Fruits.  Vegetable oils, such as olive,  canola, and sunflower oil.  Legumes, such as peas, beans, and lentils.  Nuts and seeds.  Egg whites. Summary  The body needs cholesterol in small amounts, but too much cholesterol can cause damage to the arteries and heart.  Most people should eat less than 200 milligrams (mg) of cholesterol a day. This information is not intended to replace advice given to you by your health care provider. Make sure you discuss any questions you have with your health care provider. Document Revised: 10/25/2017 Document Reviewed: 07/09/2017 Elsevier Patient Education  [place].  Varicose Veins Varicose veins are veins that have become enlarged, bulged, and twisted. They most often appear in the legs. What are the causes? This condition is caused by damage to the valves in the vein. These valves help blood return to your heart. When they are damaged and they stop working properly, blood may flow backward and back up in the veins near the skin, causing the veins to get larger and appear twisted. The condition can result from any issue that causes blood to back up, like pregnancy, prolonged standing, or obesity. What increases the risk? This condition is more likely to develop in people who are:  On their feet a lot.  Pregnant.  Overweight. What are the signs or symptoms? Symptoms of this condition include:  Bulging, twisted, and bluish veins.  A feeling of heaviness. This may be worse at the end of the day.  Leg pain. This may be worse at the end of the day.  Swelling in the leg.  Changes in skin color over the veins. How is this diagnosed? This condition may be diagnosed based on your symptoms, a physical exam, and an ultrasound test. How is this treated? Treatment for this condition may involve:  Avoiding sitting or standing in one position for long periods of time.  Wearing compression stockings. These stockings help to prevent blood clots and reduce swelling in the  legs.  Raising (elevating) the legs when resting.  Losing weight.  Exercising regularly. If you have persistent symptoms or want to improve the way your varicose veins look, you may choose to have a procedure to close the varicose veins off or to remove them. Treatments to close off the veins include:  Sclerotherapy. In this treatment, a solution is injected into a vein to close it off.  Laser treatment. In this treatment, the vein is heated with a laser to close it off.  Radiofrequency vein ablation. In this treatment, an electrical current produced by radio waves is used to close off the vein. Treatments to remove the veins include:  Phlebectomy. In this treatment, the veins are removed through small incisions made over the veins.  Vein ligation and stripping. In this treatment, incisions are made over the veins. The veins are then removed after being tied (ligated) with stitches (sutures). Follow these instructions at home: Activity  Walk as much as possible. Walking increases blood flow. This helps blood return to the heart and takes pressure off your veins. It also increases your cardiovascular strength.  Follow your health care provider's instructions about exercising.  Do not stand or sit in one position for a long period of time.  Do not sit with your legs crossed.  Rest with your legs raised during the day. General instructions   Follow any diet instructions given to you by your health care provider.  Wear compression stockings as directed by your health care provider. Do not  wear other kinds of tight clothing around your legs, pelvis, or waist.  Elevate your legs at night to above the level of your heart.  If you get a cut in the skin over the varicose vein and the vein bleeds: ? Lie down with your leg raised. ? Apply firm pressure to the cut with a clean cloth until the bleeding stops. ? Place a bandage (dressing) on the cut. Contact a health care provider  if:  The skin around your varicose veins starts to break down.  You have pain, redness, tenderness, or hard swelling over a vein.  You are uncomfortable because of pain.  You get a cut in the skin over a varicose vein and it will not stop bleeding. Summary  Varicose veins are veins that have become enlarged, bulged, and twisted. They most often appear in the legs.  This condition is caused by damage to the valves in the vein. These valves help blood return to your heart.  Treatment for this condition includes frequent movements, wearing compression stockings, losing weight, and exercising regularly. In some cases, procedures are done to close off or remove the veins.  Treatment for this condition may include wearing compression stockings, elevating the legs, losing weight, and engaging in regular activity. In some cases, procedures are done to close off or remove the veins. This information is not intended to replace advice given to you by your health care provider. Make sure you discuss any questions you have with your health care provider. Document Revised: 01/08/2019 Document Reviewed: 12/05/2016 Elsevier Patient Education  [place].

## 2020-11-09 NOTE — Progress Notes (Signed)
Chief Complaint  Patient presents with  . Tinnitus  . Foot Pain    History of ingrown toe nails   . Eye Problem    Red eyes, no history of allergies    F/u daughter Carmen Humphrey 1. C/o painful toenails with h/o bunions and hammer toes I rec as well as podiatry wider shoes and she is wearing toe cushions  Has seen [facility] podiatry as well as [facility]   2. Constipation chronic doing prune juice declines miralax/senna colace  3. Memory loss per daughter seen Dr. Manuella Ghazi in the past declines to take medication but pt repeats herself  4. C/o larger tongue and pain and red circular rash around both eyes w/o itching redness worse at times  Declines magic mouthwash today  5. C/o tinnitis declines hearing loss but disc consider ENT in the future   Review of Systems  Constitutional: Negative for weight loss.  HENT: Positive for tinnitus. Negative for hearing loss.   Eyes: Negative for blurred vision.  Respiratory: Negative for shortness of breath.   Cardiovascular: Negative for chest pain.  Gastrointestinal: Positive for constipation.  Musculoskeletal: Negative for falls and joint pain.  Skin: Positive for rash. Negative for itching.  Psychiatric/Behavioral: Positive for memory loss.   Past Medical History:  Diagnosis Date  . Abdominal pain   . ANA positive    1:320 08/09/16   . Aortic atherosclerosis ([place])   . Arthritis    modreate right hips, C DD severe C5/6, b/l knees patellar enthesophytosis b/l   . Bowel habit changes    pressure  . Cancer ([place])   . Carotid stenosis    b/l mild 06/06/16  . Chronic constipation   . Colon polyps   . Colon polyps    x2 removal  . Depression   . Fibromyalgia   . Heart disease 2012  . Hemorrhoids   . History of fainting spells of unknown cause   . HLD (hyperlipidemia)   . Inflammatory polyps of colon ([place])   . Myoclonic jerking   . Poor appetite   . Psoriasis   . Right leg DVT ([place])    fall 09/2020 dx'ed [facility]  . Shortness of  breath dyspnea   . Tinnitus   . Urinary urgency   . Vaginal polyp   . Vitamin D deficiency   . Vitamin D deficiency    Past Surgical History:  Procedure Laterality Date  . BREAST SURGERY     left breast   . ECTOPIC PREGNANCY SURGERY    . ENDOMETRIAL BIOPSY     07/23/16 neg bx  . fallopian tube removal    . HYSTEROSCOPY WITH D & C N/A 07/23/2016   Procedure: DILATATION AND CURETTAGE;  Surgeon: Benjaman Kindler, MD;  Location: [facility] ORS;  Service: Gynecology;  Laterality: N/A;  . right foot surgery     . TONSILLECTOMY    . TUBAL LIGATION     Family History  Problem Relation Age of Onset  . Heart disease Mother   . Hypertension Mother   . Arthritis Mother   . Heart disease Father   . Hypertension Father    Social History   Socioeconomic History  . Marital status: Widowed    Spouse name: Not on file  . Number of children: Not on file  . Years of education: Not on file  . Highest education level: Not on file  Occupational History  . Not on file  Tobacco Use  . Smoking status: Never Smoker  .  Smokeless tobacco: Never Used  Substance and Sexual Activity  . Alcohol use: No    Alcohol/week: 0.0 standard drinks  . Drug use: No  . Sexual activity: Not Currently  Other Topics Concern  . 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 Cholecalciferol (VITAMIN D3) 1000 units CAPS Take 1 capsule by mouth daily.  . Coenzyme Q10-Fish Oil-Vit E (CO-Q 10 OMEGA-3 FISH OIL) CAPS Take 2 capsules by mouth daily.  . NON FORMULARY [place] Blue Stuff  . psyllium  (METAMUCIL) 58.6 % powder Take 1 packet by mouth daily as needed.   Allergies  Allergen Reactions  . Ciprofloxacin Nausea And Vomiting  . Fish Allergy Other (See Comments)    Perch Fish  . Fish-Derived Products Other (See Comments)    Perch Fish  . Shellfish-Derived Products    Recent Results (from the past 2160 hour(s))  Vitamin D (25 hydroxy)     Status: None   Collection Time: 11/03/20  8:31 AM  Result Value Ref Range   VITD 37.33 30.00 - 100.00 ng/mL  Urinalysis, Routine w reflex microscopic     Status: None   Collection Time: 11/03/20  8:31 AM  Result Value Ref Range   Color, Urine YELLOW YELLOW   APPearance CLEAR CLEAR   Specific Gravity, Urine 1.008 1.001 - 1.03   pH 7.0 5.0 - 8.0   Glucose, UA NEGATIVE NEGATIVE   Bilirubin Urine NEGATIVE NEGATIVE   Ketones, ur NEGATIVE NEGATIVE   Hgb urine dipstick NEGATIVE NEGATIVE   Protein, ur NEGATIVE NEGATIVE   Nitrite NEGATIVE NEGATIVE   Leukocytes,Ua NEGATIVE NEGATIVE  Lipid panel     Status: Abnormal   Collection Time: 11/03/20  8:31 AM  Result Value Ref Range   Cholesterol 237 (H) 0 - 200 mg/dL    Comment: ATP III Classification       Desirable:  < 200 mg/dL               Borderline High:  200 - 239 mg/dL          High:  > = 240 mg/dL   Triglycerides 82.0 0.0 - 149.0 mg/dL    Comment: Normal:  <150 mg/dLBorderline High:  150 - 199 mg/dL   HDL 68.00 >39.00 mg/dL   VLDL 16.4 0.0 - 40.0 mg/dL   LDL Cholesterol 152 (H) 0 - 99 mg/dL   Total CHOL/HDL Ratio 3     Comment:                Men          Women1/2 Average Risk     3.4          3.3Average Risk          5.0          4.42X Average Risk          9.6          7.13X Average Risk  15.0          11.0                       NonHDL 168.67     Comment: NOTE:  Non-HDL goal should be 30 mg/dL higher than patient's LDL goal (i.e. LDL goal of < 70 mg/dL, would have non-HDL goal of < 100 mg/dL)  Comprehensive metabolic panel     Status: Abnormal   Collection Time: 11/03/20   8:31 AM  Result Value Ref Range   Sodium 134 (L) 135 - 145 mEq/L   Potassium 4.3 3.5 - 5.1 mEq/L   Chloride 99 96 - 112 mEq/L   CO2 27 19 - 32 mEq/L   Glucose, Bld 81 70 - 99 mg/dL   BUN 8 6 - 23 mg/dL   Creatinine, Ser 0.61 0.40 - 1.20 mg/dL   Total Bilirubin 1.2 0.2 - 1.2 mg/dL   Alkaline Phosphatase 90 39 - 117 U/L   AST 19 0 - 37 U/L   ALT 15 0 - 35 U/L   Total Protein 6.4 6.0 - 8.3 g/dL   Albumin 3.8 3.5 - 5.2 g/dL   GFR 76.86 >60.00 mL/min    Comment: Calculated using the CKD-EPI Creatinine Equation (2021)   Calcium 9.0 8.4 - 10.5 mg/dL  CBC w/Diff     Status: None   Collection Time: 11/03/20  8:31 AM  Result Value Ref Range   WBC 6.2 4.0 - 10.5 K/uL   RBC 4.41 3.87 - 5.11 Mil/uL   Hemoglobin 13.2 12.0 - 15.0 g/dL   HCT 39.3 36.0 - 46.0 %   MCV 89.1 78.0 - 100.0 fl   MCHC 33.5 30.0 - 36.0 g/dL   RDW 13.2 11.5 - 15.5 %   Platelets 263.0 150.0 - 400.0 K/uL   Neutrophils Relative % 71.2 43.0 - 77.0 %   Lymphocytes Relative 19.3 12.0 - 46.0 %   Monocytes Relative 7.6 3.0 - 12.0 %   Eosinophils Relative 0.8 0.0 - 5.0 %   Basophils Relative 1.1 0.0 - 3.0 %   Neutro Abs 4.4 1.4 - 7.7 K/uL   Lymphs Abs 1.2 0.7 - 4.0 K/uL   Monocytes Absolute 0.5 0.1 - 1.0 K/uL   Eosinophils Absolute 0.0 0.0 - 0.7 K/uL   Basophils Absolute 0.1 0.0 - 0.1 K/uL  TSH     Status: Abnormal   Collection Time: 11/03/20  8:31 AM  Result Value Ref Range   TSH 5.38 (H) 0.35 - 4.50 uIU/mL   Objective  Body mass index is 20.56 kg/m. Wt Readings from Last 3 Encounters:  11/09/20 127 lb 6.4 oz (57.8 kg)  10/24/20 126 lb (57.2 kg)  10/07/20 129 lb 6.4 oz (58.7 kg)   Temp Readings from Last 3 Encounters:  11/09/20 97.9 F (36.6 C) (Oral)  10/07/20 98.2 F (36.8 C) (Oral)  02/15/20 (!) 97 F (36.1 C)   BP Readings from Last 3 Encounters:  11/09/20 124/82  10/24/20 134/65  10/07/20 122/76   Pulse Readings from Last 3 Encounters:  11/09/20 70  10/24/20 72  10/07/20 75    Physical  Exam Vitals and nursing note reviewed.  Constitutional:      Appearance: Normal appearance. She is well-developed and well-groomed.  HENT:     Head: Normocephalic and atraumatic.     Mouth/Throat:     Comments: No sig tongue swelling today  Eyes:     Conjunctiva/sclera: Conjunctivae normal.     Pupils:  Pupils are equal, round, and reactive to light.     Comments: Heliotrope rash around eyelids   Cardiovascular:     Rate and Rhythm: Normal rate and regular rhythm.     Heart sounds: Normal heart sounds.  Pulmonary:     Effort: Pulmonary effort is normal.     Breath sounds: Normal breath sounds.  Abdominal:     General: Abdomen is flat. Bowel sounds are normal.     Tenderness: There is no abdominal tenderness.  Skin:    General: Skin is warm and moist.  Neurological:     General: No focal deficit present.     Mental Status: She is alert and oriented to person, place, and time. Mental status is at baseline.     Gait: Gait normal.  Psychiatric:        Attention and Perception: Attention and perception normal.        Mood and Affect: Mood and affect normal.        Speech: Speech normal.        Behavior: Behavior normal. Behavior is cooperative.        Thought Content: Thought content normal.        Cognition and Memory: Cognition and memory normal.        Judgment: Judgment normal.     Assessment  Plan  Pain around toenail F/u [facility] podiatry  Rec wider shoes  Constipation, unspecified constipation type Disc warm prune juice declines miralax, senna/colace Tinnitus of both ears  Memory loss Seen Dr. Manuella Ghazi in the past declines meds for now per daughter  Tongue pain Heliotrope eyelid rash (HCC)  R/o dermatomyositis amyloid  HM Declines flu shot  Pfizer 2/2 disc booster  Reviewed 11/01/20 [facility] (vaccines prev declined vaccines, imaging I.e echo reviewed nl mild DD 11/2019, notes)  -disc prevnar, Tdap in future   Out of age window colonoscopy (fobt neg x3  06/13/20), dexa, pap, mammogram    -consider DEXA in future  Provider: Dr. Olivia Mackie McLean-Scocuzza-Internal Medicine

## 2020-11-09 NOTE — Addendum Note (Signed)
Addended by: Orland Mustard on: 11/09/2020 12:59 PM   Modules accepted: Orders

## 2020-11-17 ENCOUNTER — Ambulatory Visit (INDEPENDENT_AMBULATORY_CARE_PROVIDER_SITE_OTHER): Payer: Medicare Other | Admitting: Vascular Surgery

## 2020-11-17 ENCOUNTER — Encounter (INDEPENDENT_AMBULATORY_CARE_PROVIDER_SITE_OTHER): Payer: Medicare Other

## 2020-12-05 ENCOUNTER — Telehealth: Payer: Self-pay | Admitting: Internal Medicine

## 2020-12-05 NOTE — Telephone Encounter (Signed)
Patient's daughter called, she stated her mother has dementia and is also hard of hearing. There is no way patient could do a AWV.

## 2020-12-05 NOTE — Telephone Encounter (Signed)
Left message for patient to call back and schedule Medicare Annual Wellness Visit (AWV)   This should be a virtual visit only=30 minutes.  No hx of AWV; please schedule at anytime with Denisa O'Brien-Blaney at [facility]  AWV-I PER [place] AS OF 05/26/2017

## 2020-12-05 NOTE — Telephone Encounter (Signed)
CORRECTION: No VM was left. Phone rang/no answer/no vm

## 2020-12-09 NOTE — Telephone Encounter (Signed)
Noted! Thank you

## 2020-12-12 ENCOUNTER — Other Ambulatory Visit: Payer: Medicare Other

## 2020-12-14 ENCOUNTER — Other Ambulatory Visit (INDEPENDENT_AMBULATORY_CARE_PROVIDER_SITE_OTHER): Payer: Medicare Other

## 2020-12-14 ENCOUNTER — Other Ambulatory Visit: Payer: Self-pay

## 2020-12-14 DIAGNOSIS — R946 Abnormal results of thyroid function studies: Secondary | ICD-10-CM

## 2020-12-14 DIAGNOSIS — K146 Glossodynia: Secondary | ICD-10-CM | POA: Diagnosis not present

## 2020-12-14 DIAGNOSIS — E859 Amyloidosis, unspecified: Secondary | ICD-10-CM | POA: Diagnosis not present

## 2020-12-14 DIAGNOSIS — M3390 Dermatopolymyositis, unspecified, organ involvement unspecified: Secondary | ICD-10-CM

## 2020-12-14 DIAGNOSIS — E538 Deficiency of other specified B group vitamins: Secondary | ICD-10-CM

## 2020-12-14 LAB — CK: Total CK: 109 U/L (ref 7–177)

## 2020-12-14 LAB — VITAMIN B12: Vitamin B-12: 442 pg/mL (ref 211–911)

## 2020-12-14 LAB — T3, FREE: T3, Free: 3.1 pg/mL (ref 2.3–4.2)

## 2020-12-14 LAB — TSH: TSH: 4.09 u[IU]/mL (ref 0.35–4.50)

## 2020-12-14 LAB — T4, FREE: Free T4: 0.77 ng/dL (ref 0.60–1.60)

## 2020-12-15 ENCOUNTER — Ambulatory Visit (INDEPENDENT_AMBULATORY_CARE_PROVIDER_SITE_OTHER): Payer: Medicare Other | Admitting: Podiatry

## 2020-12-15 ENCOUNTER — Encounter: Payer: Self-pay | Admitting: Podiatry

## 2020-12-15 DIAGNOSIS — M79609 Pain in unspecified limb: Secondary | ICD-10-CM

## 2020-12-15 DIAGNOSIS — B351 Tinea unguium: Secondary | ICD-10-CM

## 2020-12-15 DIAGNOSIS — D689 Coagulation defect, unspecified: Secondary | ICD-10-CM

## 2020-12-15 LAB — THYROID PEROXIDASE ANTIBODY: Thyroperoxidase Ab SerPl-aCnc: <1 IU/mL (ref <9)

## 2020-12-16 ENCOUNTER — Encounter: Payer: Self-pay | Admitting: Podiatry

## 2020-12-16 LAB — PROTEIN ELECTROPHORESIS, URINE REFLEX
Albumin ELP, Urine: 100 %
Alpha-1-Globulin, U: 0 %
Alpha-2-Globulin, U: 0 %
Beta Globulin, U: 0 %
Gamma Globulin, U: 0 %
Protein, Ur: <4 mg/dL

## 2020-12-16 NOTE — Progress Notes (Signed)
°  Subjective:  Patient ID: Carmen Humphrey, female    DOB: 1925/07/29,  MRN: [id number]  Chief Complaint  Patient presents with   Nail Problem    Nail trim RFC   87 y.o. female returns for the above complaint.  Patient presents with thickened elongated dystrophic toenails x10.  Patient would like to have them debrided down.  She states is painful to touch.  She states she is not able to take care of it herself.  Patient is on a blood thinner.  She denies any other acute complaints today.  She would like for me to debride them down.  Objective:   There were no vitals filed for this visit. Podiatric Exam: Vascular: dorsalis pedis and posterior tibial pulses are palpable bilateral. Capillary return is immediate. Temperature gradient is WNL. Skin turgor WNL  Sensorium: Normal Semmes Weinstein monofilament test. Normal tactile sensation bilaterally. Nail Exam: Pt has thick disfigured discolored nails with subungual debris noted bilateral entire nail hallux through fifth toenails Ulcer Exam: There is no evidence of ulcer or pre-ulcerative changes or infection. Orthopedic Exam: Muscle tone and strength are WNL. No limitations in general ROM. No crepitus or effusions noted. HAV  B/L.  Hammer toes 2-5  B/L. Skin: No Porokeratosis. No infection or ulcers.  Mild ingrown noted of the right hallux without any paronychia or clinical signs of infection.  Heloma molle noted to bilateral first and second digit facing sides.  Mild pain on palpation  Assessment & Plan:  Patient was evaluated and treated and all questions answered.  Bilateral severe bunion deformity with hammertoe contractures of 2 through 5 -The spacer and offloading with shoe gear modification has helped tremendously.  Patient still has some pain but much more manageable.  Patient just seems to continue to have the same discussion without any acute management.  She also has been noncompliant with conservative treatment options including  applying toe pressure is appropriately.  Right hallux mild ingrown -I explained to the patient the etiology of ingrown and likely the hallux valgus rotation of the big toe into the second toe is still primary cause of the pain associated with this.  Unfortunately I will hold off on removal given patient's age and the pain associated with this has to do with the biomechanical deformity of the toe.  I believe she will benefit from a toe spacer.  Toe spacer was dispensed.  Bilateral first and second facing surface heloma molle -I explained to the patient the etiology, heloma molle and various treatment options were discussed.  I have encouraged her to continue using toe protectors and spacers at this time.  This is very mild in nature.  Patient states understanding  Onychomycosis with pain  -Nails palliatively debrided as below. -Educated on self-care  Procedure: Nail Debridement Rationale: pain  Type of Debridement: manual, sharp debridement. Instrumentation: Nail nipper, rotary burr. Number of Nails: 10  Procedures and Treatment: Consent by patient was obtained for treatment procedures. The patient understood the discussion of treatment and procedures well. All questions were answered thoroughly reviewed. Debridement of mycotic and hypertrophic toenails, 1 through 5 bilateral and clearing of subungual debris. No ulceration, no infection noted.  Return Visit-Office Procedure: Patient instructed to return to the office for a follow up visit 3 months for continued evaluation and treatment.  Boneta Lucks, DPM    No follow-ups on file.      Boneta Lucks, DPM    No follow-ups on file.

## 2020-12-19 LAB — PE AND FLC, SERUM
A/G Ratio: 1.3 (ref 0.7–1.7)
Albumin ELP: 4 g/dL (ref 2.9–4.4)
Alpha 1: 0.2 g/dL (ref 0.0–0.4)
Alpha 2: 0.8 g/dL (ref 0.4–1.0)
Beta: 1.2 g/dL (ref 0.7–1.3)
Gamma Globulin: 0.9 g/dL (ref 0.4–1.8)
Globulin, Total: 3.1 g/dL (ref 2.2–3.9)
Ig Kappa Free Light Chain: 18.2 mg/L (ref 3.3–19.4)
Ig Lambda Free Light Chain: 14.7 mg/L (ref 5.7–26.3)
KAPPA/LAMBDA RATIO: 1.24 (ref 0.26–1.65)
Total Protein: 7.1 g/dL (ref 6.0–8.5)

## 2020-12-19 LAB — ANA: Anti Nuclear Antibody (ANA): NEGATIVE

## 2020-12-19 LAB — PROTEIN ELECTROPHORESIS, SERUM

## 2020-12-19 LAB — ALDOLASE: Aldolase: 5.5 U/L (ref 3.3–10.3)

## 2020-12-19 LAB — LACTATE DEHYDROGENASE: LDH: 225 IU/L (ref 119–226)

## 2020-12-19 LAB — ANTI-U1 RNP AB (RDL): Anti-U1 RNP Ab (RDL): <20 Units (ref <20)

## 2021-01-10 ENCOUNTER — Encounter: Payer: Self-pay | Admitting: Internal Medicine

## 2021-01-10 ENCOUNTER — Ambulatory Visit (INDEPENDENT_AMBULATORY_CARE_PROVIDER_SITE_OTHER): Payer: Medicare Other | Admitting: Internal Medicine

## 2021-01-10 ENCOUNTER — Other Ambulatory Visit: Payer: Self-pay

## 2021-01-10 VITALS — BP 140/82 | HR 79 | Ht 66.0 in | Wt 132.2 lb

## 2021-01-10 DIAGNOSIS — R269 Unspecified abnormalities of gait and mobility: Secondary | ICD-10-CM | POA: Insufficient documentation

## 2021-01-10 DIAGNOSIS — Z9181 History of falling: Secondary | ICD-10-CM

## 2021-01-10 DIAGNOSIS — K148 Other diseases of tongue: Secondary | ICD-10-CM | POA: Diagnosis not present

## 2021-01-10 DIAGNOSIS — H9201 Otalgia, right ear: Secondary | ICD-10-CM | POA: Diagnosis not present

## 2021-01-10 DIAGNOSIS — R590 Localized enlarged lymph nodes: Secondary | ICD-10-CM | POA: Diagnosis not present

## 2021-01-10 HISTORY — DX: History of falling: Z91.81

## 2021-01-10 NOTE — Progress Notes (Signed)
Chief Complaint  Patient presents with  . Follow-up  . balance   F/u with daughter [name]  1. C/o balance issues when just getting up has a cane and not using no falls yet  2. Tongue enlargement since being on eliquis per daughter dental did not see anything at times tongue interferes with chewing  12/14/20 labs no etiology reviewed with patient 3. Right ear behind ear swollen gland and chronic tinnitis pain 2/10 behind right ear    Review of Systems  Constitutional: Negative for weight loss.  HENT: Positive for tinnitus.   Eyes: Negative for blurred vision.  Cardiovascular: Negative for chest pain.  Musculoskeletal: Negative for falls and joint pain.  Skin: Negative for rash.  Neurological: Positive for dizziness.  Psychiatric/Behavioral: Negative for depression.   Past Medical History:  Diagnosis Date  . Abdominal pain   . ANA positive    1:320 08/09/16   . Aortic atherosclerosis ([place])   . Arthritis    modreate right hips, C DD severe C5/6, b/l knees patellar enthesophytosis b/l   . Bowel habit changes    pressure  . Cancer ([place])   . Carotid stenosis    b/l mild 06/06/16  . Chronic constipation   . Colon polyps   . Colon polyps    x2 removal  . Depression   . Fibromyalgia   . Heart disease 2012  . Hemorrhoids   . History of fainting spells of unknown cause   . HLD (hyperlipidemia)   . Inflammatory polyps of colon ([place])   . Myoclonic jerking   . Poor appetite   . Psoriasis   . Right leg DVT ([place])    fall 09/2020 dx'ed [facility]  . Shortness of breath dyspnea   . Tinnitus   . Urinary urgency   . Vaginal polyp   . Vitamin D deficiency   . Vitamin D deficiency    Past Surgical History:  Procedure Laterality Date  . BREAST SURGERY     left breast   . ECTOPIC PREGNANCY SURGERY    . ENDOMETRIAL BIOPSY     07/23/16 neg bx  . fallopian tube removal    . HYSTEROSCOPY WITH D & C N/A 07/23/2016   Procedure: DILATATION AND CURETTAGE;  Surgeon:  Benjaman Kindler, MD;  Location: [facility] ORS;  Service: Gynecology;  Laterality: N/A;  . right foot surgery     . TONSILLECTOMY    . TUBAL LIGATION     Family History  Problem Relation Age of Onset  . Heart disease Mother   . Hypertension Mother   . Arthritis Mother   . Heart disease Father   . Hypertension Father    Social History   Socioeconomic History  . Marital status: Widowed    Spouse name: Not on file  . Number of children: Not on file  . Years of education: Not on file  . Highest education level: Not on file  Occupational History  . Not on file  Tobacco Use  . Smoking status: Never Smoker  . Smokeless tobacco: Never Used  Substance and Sexual Activity  . Alcohol use: No    Alcohol/week: 0.0 standard drinks  . Drug use: No  . Sexual activity: Not Currently  Other Topics Concern  . 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 Cholecalciferol (VITAMIN D3) 1000 units CAPS Take 1 capsule by mouth daily.  . Coenzyme Q10-Fish Oil-Vit E (CO-Q 10 OMEGA-3 FISH OIL) CAPS Take 2 capsules by mouth daily.  . NON FORMULARY [place] Blue Stuff  . psyllium (METAMUCIL) 58.6 % powder Take 1 packet by mouth daily as needed.   Allergies  Allergen Reactions  . Ciprofloxacin Nausea And Vomiting  . Eliquis [Apixaban] Swelling    Tongue swelling and numbness   . Fish Allergy Other (See Comments)    Perch Fish  . Fish-Derived Products Other (See Comments)    Perch Fish  . Shellfish-Derived Products    Recent Results (from the past 2160 hour(s))  Vitamin D (25  hydroxy)     Status: None   Collection Time: 11/03/20  8:31 AM  Result Value Ref Range   VITD 37.33 30.00 - 100.00 ng/mL  Urinalysis, Routine w reflex microscopic     Status: None   Collection Time: 11/03/20  8:31 AM  Result Value Ref Range   Color, Urine YELLOW YELLOW   APPearance CLEAR CLEAR   Specific Gravity, Urine 1.008 1.001 - 1.03   pH 7.0 5.0 - 8.0   Glucose, UA NEGATIVE NEGATIVE   Bilirubin Urine NEGATIVE NEGATIVE   Ketones, ur NEGATIVE NEGATIVE   Hgb urine dipstick NEGATIVE NEGATIVE   Protein, ur NEGATIVE NEGATIVE   Nitrite NEGATIVE NEGATIVE   Leukocytes,Ua NEGATIVE NEGATIVE  Lipid panel     Status: Abnormal   Collection Time: 11/03/20  8:31 AM  Result Value Ref Range   Cholesterol 237 (H) 0 - 200 mg/dL    Comment: ATP III Classification       Desirable:  < 200 mg/dL               Borderline High:  200 - 239 mg/dL          High:  > = 240 mg/dL   Triglycerides 82.0 0.0 - 149.0 mg/dL    Comment: Normal:  <150 mg/dLBorderline High:  150 - 199 mg/dL   HDL 68.00 >39.00 mg/dL   VLDL 16.4 0.0 - 40.0 mg/dL   LDL Cholesterol 152 (H) 0 - 99 mg/dL   Total CHOL/HDL Ratio 3     Comment:                Men          Women1/2 Average Risk     3.4          3.3Average Risk          5.0          4.42X Average Risk          9.6          7.13X Average Risk          15.0          11.0                       NonHDL 168.67     Comment: NOTE:  Non-HDL goal should be 30 mg/dL higher than patient's LDL goal (i.e. LDL goal of < 70 mg/dL, would have non-HDL  goal of < 100 mg/dL)  Comprehensive metabolic panel     Status: Abnormal   Collection Time: 11/03/20  8:31 AM  Result Value Ref Range   Sodium 134 (L) 135 - 145 mEq/L   Potassium 4.3 3.5 - 5.1 mEq/L   Chloride 99 96 - 112 mEq/L   CO2 27 19 - 32 mEq/L   Glucose, Bld 81 70 - 99 mg/dL   BUN 8 6 - 23 mg/dL   Creatinine, Ser 0.61 0.40 - 1.20 mg/dL   Total Bilirubin 1.2 0.2 - 1.2 mg/dL   Alkaline Phosphatase 90 39 - 117 U/L   AST 19 0 - 37 U/L    ALT 15 0 - 35 U/L   Total Protein 6.4 6.0 - 8.3 g/dL   Albumin 3.8 3.5 - 5.2 g/dL   GFR 76.86 >60.00 mL/min    Comment: Calculated using the CKD-EPI Creatinine Equation (2021)   Calcium 9.0 8.4 - 10.5 mg/dL  CBC w/Diff     Status: None   Collection Time: 11/03/20  8:31 AM  Result Value Ref Range   WBC 6.2 4.0 - 10.5 K/uL   RBC 4.41 3.87 - 5.11 Mil/uL   Hemoglobin 13.2 12.0 - 15.0 g/dL   HCT 39.3 36.0 - 46.0 %   MCV 89.1 78.0 - 100.0 fl   MCHC 33.5 30.0 - 36.0 g/dL   RDW 13.2 11.5 - 15.5 %   Platelets 263.0 150.0 - 400.0 K/uL   Neutrophils Relative % 71.2 43.0 - 77.0 %   Lymphocytes Relative 19.3 12.0 - 46.0 %   Monocytes Relative 7.6 3.0 - 12.0 %   Eosinophils Relative 0.8 0.0 - 5.0 %   Basophils Relative 1.1 0.0 - 3.0 %   Neutro Abs 4.4 1.4 - 7.7 K/uL   Lymphs Abs 1.2 0.7 - 4.0 K/uL   Monocytes Absolute 0.5 0.1 - 1.0 K/uL   Eosinophils Absolute 0.0 0.0 - 0.7 K/uL   Basophils Absolute 0.1 0.0 - 0.1 K/uL  TSH     Status: Abnormal   Collection Time: 11/03/20  8:31 AM  Result Value Ref Range   TSH 5.38 (H) 0.35 - 4.50 uIU/mL  Antinuclear Antib (ANA)     Status: None   Collection Time: 12/14/20 10:04 AM  Result Value Ref Range   Anti Nuclear Antibody (ANA) Negative Negative  PE and FLC, Serum     Status: None   Collection Time: 12/14/20 10:04 AM  Result Value Ref Range   Total Protein 7.1 6.0 - 8.5 g/dL   Albumin ELP 4.0 2.9 - 4.4 g/dL   Alpha 1 0.2 0.0 - 0.4 g/dL   Alpha 2 0.8 0.4 - 1.0 g/dL   Beta 1.2 0.7 - 1.3 g/dL   Gamma Globulin 0.9 0.4 - 1.8 g/dL   M-Spike, % Not Observed Not Observed g/dL   Globulin, Total 3.1 2.2 - 3.9 g/dL   A/G Ratio 1.3 0.7 - 1.7   Please Note: Comment     Comment: Protein electrophoresis scan will follow via computer, mail, or courier delivery.    Ig Kappa Free Light Chain 18.2 3.3 - 19.4 mg/L   Ig Lambda Free Light Chain 14.7 5.7 - 26.3 mg/L   KAPPA/LAMBDA RATIO 1.24 0.26 - 1.65  Protein electrophoresis, serum     Status: None    Collection Time: 12/14/20 10:04 AM  Result Value Ref Range   Interpretation: Comment     Comment: The SPE pattern appears unremarkable. Evidence of monoclonal protein is not  apparent.   Aldolase     Status: None   Collection Time: 12/14/20 10:04 AM  Result Value Ref Range   Aldolase 5.5 3.3 - 10.3 U/L  Lactate Dehydrogenase (LDH)     Status: None   Collection Time: 12/14/20 10:04 AM  Result Value Ref Range   LDH 225 119 - 226 IU/L  CK (Creatine Kinase)     Status: None   Collection Time: 12/14/20 10:04 AM  Result Value Ref Range   Total CK 109 7 - 177 U/L  B12     Status: None   Collection Time: 12/14/20 10:04 AM  Result Value Ref Range   Vitamin B-12 442 211 - 911 pg/mL  T3, free     Status: None   Collection Time: 12/14/20 10:04 AM  Result Value Ref Range   T3, Free 3.1 2.3 - 4.2 pg/mL  T4, free     Status: None   Collection Time: 12/14/20 10:04 AM  Result Value Ref Range   Free T4 0.77 0.60 - 1.60 ng/dL    Comment: Specimens from patients who are undergoing biotin therapy and /or ingesting biotin supplements may contain high levels of biotin.  The higher biotin concentration in these specimens interferes with this Free T4 assay.  Specimens that contain high levels  of biotin may cause false high results for this Free T4 assay.  Please interpret results in light of the total clinical presentation of the patient.    Thyroid peroxidase antibody     Status: None   Collection Time: 12/14/20 10:04 AM  Result Value Ref Range   Thyroperoxidase Ab SerPl-aCnc <1 <9 IU/mL  TSH     Status: None   Collection Time: 12/14/20 10:04 AM  Result Value Ref Range   TSH 4.09 0.35 - 4.50 uIU/mL  Protein Electrophoresis, Urine Rflx.     Status: None   Collection Time: 12/14/20 10:06 AM  Result Value Ref Range   Protein, Ur <4.0 Not Estab. mg/dL   Albumin ELP, Urine 100.0 %   Alpha-1-Globulin, U 0.0 %   Alpha-2-Globulin, U 0.0 %   Beta Globulin, U 0.0 %   Gamma Globulin, U 0.0 %   M  Component, Ur Not Observed Not Observed %   Please Note: Comment     Comment: Protein electrophoresis scan will follow via computer, mail, or courier delivery.   Anti-U1 RNP Ab (RDL)     Status: None   Collection Time: 12/14/20 10:06 AM  Result Value Ref Range   Anti-U1 RNP Ab (RDL) <20 <20 Units    Comment:                      Negative:                   <20                      Weak Positive:          20 - 39                      Moderate Positive:      40 - 80                      Strong Positive:            >80    Objective  Body mass index is 21.34 kg/m. Wt Readings from Last  3 Encounters:  01/10/21 132 lb 3.2 oz (60 kg)  11/09/20 127 lb 6.4 oz (57.8 kg)  10/24/20 126 lb (57.2 kg)   Temp Readings from Last 3 Encounters:  11/09/20 97.9 F (36.6 C) (Oral)  10/07/20 98.2 F (36.8 C) (Oral)  02/15/20 (!) 97 F (36.1 C)   BP Readings from Last 3 Encounters:  01/10/21 140/82  11/09/20 124/82  10/24/20 134/65   Pulse Readings from Last 3 Encounters:  01/10/21 79  11/09/20 70  10/24/20 72    Physical Exam Vitals and nursing note reviewed.  Constitutional:      Appearance: Normal appearance. She is well-developed and well-groomed.  HENT:     Head: Normocephalic and atraumatic.  Eyes:     Conjunctiva/sclera: Conjunctivae normal.     Pupils: Pupils are equal, round, and reactive to light.  Neck:   Cardiovascular:     Rate and Rhythm: Normal rate and regular rhythm.     Heart sounds: Normal heart sounds. No murmur heard.   Pulmonary:     Effort: Pulmonary effort is normal.     Breath sounds: Normal breath sounds.  Lymphadenopathy:     Cervical: Cervical adenopathy present.     Right cervical: Posterior cervical adenopathy present.  Skin:    General: Skin is warm and dry.  Neurological:     General: No focal deficit present.     Mental Status: She is alert and oriented to person, place, and time. Mental status is at baseline.     Gait: Gait normal.   Psychiatric:        Attention and Perception: Attention and perception normal.        Mood and Affect: Mood and affect normal.        Speech: Speech normal.        Behavior: Behavior normal. Behavior is cooperative.        Thought Content: Thought content normal.        Cognition and Memory: Cognition and memory normal.        Judgment: Judgment normal.     Assessment  Plan  Abnormal gait At risk for falls  -rx rollator with seat and brakes today  Consider ahc pt h/h  Pt will let me know   Posterior auricular pain, right Posterior cervical adenopathy Monitor prn tylenol   Enlarged tongue Labs 12/14/20 no issues/etiology  Dental states nothing there and tongue not enlarged likely subjective  Could consider SLP h/h ahc in the future to work with pt and chewing    HM Declines flu shot  Pfizer 2/2 disc booster [facility] (tdap, prevnar, flu), imagingI.e echo reviewed nl mild DD 11/2019, notes)   Out of age window colonoscopy(fobt neg x3 06/13/20), dexa, pap, mammogram    -consider DEXA in future   Provider: Dr. Olivia Mackie McLean-Scocuzza-[facility]

## 2021-01-10 NOTE — Patient Instructions (Addendum)
Call me back if home physical therapy and speech wanted   Understanding Your Risk for Falls Each year, millions of people have serious injuries from falls. It is important to understand your risk for falling. Talk with your health care provider about your risk and what you can do to lower it. There are actions you can take at home to lower your risk and prevent falls. If you do have a serious fall, make sure to tell your health care provider. Falling once raises your risk of falling again. How can falls affect me? Serious injuries from falls are common. These include:  Broken bones, such as hip fractures.  Head injuries, such as traumatic brain injuries (TBI) or concussion. A fear of falling can cause you to avoid activities and stay at home. This can make your muscles weaker and actually raise your risk for a fall. What can increase my risk? There are a number of risk factors that increase your risk for falling. The more risk factors you have, the higher your risk of falling. Serious injuries from a fall happen most often to people older than age 85. Children and young adults ages 81-29 are also at higher risk. Common risk factors include:  Weakness in the lower body.  Lack (deficiency) of vitamin D.  Being generally weak or confused due to long-term (chronic) illness.  Dizziness or balance problems.  Poor vision.  Medicines that cause dizziness or drowsiness. These can include medicines for your blood pressure, heart, anxiety, insomnia, or edema, as well as pain medicines and muscle relaxants. Other risk factors include:  Drinking alcohol.  Having had a fall in the past.  Having depression.  Having foot pain or wearing improper footwear.  Working at a dangerous job.  Having any of the following in your home: ? Tripping hazards, such as floor clutter or loose rugs. ? Poor lighting. ? Pets.  Having dementia or memory loss. What actions can I take to lower my risk of  falling? Physical activity Maintain physical fitness. Do strength and balance exercises. Consider taking a regular class to build strength and balance. Yoga and tai chi are good options. Vision Have your eyes checked every year and your vision prescription updated as needed. Walking aids and footwear  Wear nonskid shoes. Do not wear high heels.  Do not walk around the house in socks or slippers.  Use a cane or walker as told by your health care provider. Home safety  Attach secure railings on both sides of your stairs.  Install grab bars for your tub, shower, and toilet. Use a bath mat in your tub or shower.  Use good lighting in all rooms. Keep a flashlight near your bed.  Make sure there is a clear path from your bed to the bathroom. Use night-lights.  Do not use throw rugs. Make sure all carpeting is taped or tacked down securely.  Remove all clutter from walkways and stairways, including extension cords.  Repair uneven or broken steps.  Avoid walking on icy or slippery surfaces. Walk on the grass instead of on icy or slick sidewalks. Use ice melt to get rid of ice on walkways.  Use a cordless phone.      Questions to ask your health care provider  Can you help me check my risk for a fall?  Do any of my medicines make me more likely to fall?  Should I take a vitamin D supplement?  What exercises can I do to improve my strength  and balance?  Should I make an appointment to have my vision checked?  Do I need a bone density test to check for weak bones or osteoporosis?  Would it help to use a cane or a walker? Where to find more information  Centers for Disease Control and Prevention, STEADI: http://www.wolf.info/  Community-Based Fall Prevention Programs: http://www.wolf.info/  National Institute on Aging: http://kim-miller.com/ Contact a health care provider if:  You fall at home.  You are afraid of falling at home.  You feel weak, drowsy, or dizzy. Summary  Serious injuries  from a fall happen most often to people older than age 36. Children and young adults ages 68-29 are also at higher risk.  Talk with your health care provider about your risks for falling and how to lower those risks.  Taking certain precautions at home can lower your risk for falling.  If you fall, always tell your health care provider. This information is not intended to replace advice given to you by your health care provider. Make sure you discuss any questions you have with your health care provider. Document Revised: 06/15/2020 Document Reviewed: 06/15/2020 Elsevier Patient Education  2021 [place] Prevention in the Home, Adult Falls can cause injuries and can affect people from all age groups. There are many simple things that you can do to make your home safe and to help prevent falls. Ask for help when making these changes, if needed. What actions can I take to prevent falls? General instructions  Use good lighting in all rooms. Replace any light bulbs that burn out.  Turn on lights if it is dark. Use night-lights.  Place frequently used items in easy-to-reach places. Lower the shelves around your home if necessary.  Set up furniture so that there are clear paths around it. Avoid moving your furniture around.  Remove throw rugs and other tripping hazards from the floor.  Avoid walking on wet floors.  Fix any uneven floor surfaces.  Add color or contrast paint or tape to grab bars and handrails in your home. Place contrasting color strips on the first and last steps of stairways.  When you use a stepladder, make sure that it is completely opened and that the sides are firmly locked. Have someone hold the ladder while you are using it. Do not climb a closed stepladder.  Be aware of any and all pets. What can I do in the bathroom?  Keep the floor dry. Immediately clean up any water that spills onto the floor.  Remove soap buildup in the tub or shower on a  regular basis.  Use non-skid mats or decals on the floor of the tub or shower.  Attach bath mats securely with double-sided, non-slip rug tape.  If you need to sit down while you are in the shower, use a plastic, non-slip stool.  Install grab bars by the toilet and in the tub and shower. Do not use towel bars as grab bars.      What can I do in the bedroom?  Make sure that a bedside light is easy to reach.  Do not use oversized bedding that drapes onto the floor.  Have a firm chair that has side arms to use for getting dressed. What can I do in the kitchen?  Clean up any spills right away.  If you need to reach for something above you, use a sturdy step stool that has a grab bar.  Keep electrical cables out of the way.  Do not use floor polish or wax that makes floors slippery. If you must use wax, make sure that it is non-skid floor wax. What can I do in the stairways?  Do not leave any items on the stairs.  Make sure that you have a light switch at the top of the stairs and the bottom of the stairs. Have them installed if you do not have them.  Make sure that there are handrails on both sides of the stairs. Fix handrails that are broken or loose. Make sure that handrails are as long as the stairways.  Install non-slip stair treads on all stairs in your home.  Avoid having throw rugs at the top or bottom of stairways, or secure the rugs with carpet tape to prevent them from moving.  Choose a carpet design that does not hide the edge of steps on the stairway.  Check any carpeting to make sure that it is firmly attached to the stairs. Fix any carpet that is loose or worn. What can I do on the outside of my home?  Use bright outdoor lighting.  Regularly repair the edges of walkways and driveways and fix any cracks.  Remove high doorway thresholds.  Trim any shrubbery on the main path into your home.  Regularly check that handrails are securely fastened and in good  repair. Both sides of any steps should have handrails.  Install guardrails along the edges of any raised decks or porches.  Clear walkways of debris and clutter, including tools and rocks.  Have leaves, snow, and ice cleared regularly.  Use sand or salt on walkways during winter months.  In the garage, clean up any spills right away, including grease or oil spills. What other actions can I take?  Wear closed-toe shoes that fit well and support your feet. Wear shoes that have rubber soles or low heels.  Use mobility aids as needed, such as canes, walkers, scooters, and crutches.  Review your medicines with your health care provider. Some medicines can cause dizziness or changes in blood pressure, which increase your risk of falling. Talk with your health care provider about other ways that you can decrease your risk of falls. This may include working with a physical therapist or trainer to improve your strength, balance, and endurance. Where to find more information  Centers for Disease Control and Prevention, STEADI: WebmailGuide.co.za  [employer] on Aging: BrainJudge.co.uk Contact a health care provider if:  You are afraid of falling at home.  You feel weak, drowsy, or dizzy at home.  You fall at home. Summary  There are many simple things that you can do to make your home safe and to help prevent falls.  Ways to make your home safe include removing tripping hazards and installing grab bars in the bathroom.  Ask for help when making these changes in your home. This information is not intended to replace advice given to you by your health care provider. Make sure you discuss any questions you have with your health care provider. Document Revised: 10/25/2017 Document Reviewed: 06/27/2017 Elsevier Patient Education  2021 [place].  Tinnitus Tinnitus refers to hearing a sound when there is no actual source for that sound. This is often described as  ringing in the ears. However, people with this condition may hear a variety of noises, in one ear or in both ears. The sounds of tinnitus can be soft, loud, or somewhere in between. Tinnitus can last for a few seconds or can be  constant for days. It may go away without treatment and come back at various times. When tinnitus is constant or happens often, it can lead to other problems, such as trouble sleeping and trouble concentrating. Almost everyone experiences tinnitus at some point. Tinnitus that is long-lasting (chronic) or comes back often (recurs) may require medical attention. What are the causes? The cause of tinnitus is often not known. In some cases, it can result from:  Exposure to loud noises from machinery, music, or other sources.  An object (foreign body) stuck in the ear.  Earwax buildup.  Drinking alcohol or caffeine.  Taking certain medicines.  Age-related hearing loss. It may also be caused by medical conditions such as:  Ear or sinus infections.  High blood pressure.  Heart diseases.  Anemia.  Allergies.  Meniere's disease.  Thyroid problems.  Tumors.  A weak, bulging blood vessel (aneurysm) near the ear. What are the signs or symptoms? The main symptom of tinnitus is hearing a sound when there is no source for that sound. It may sound like:  Buzzing.  Roaring.  Ringing.  Blowing air.  Hissing.  Whistling.  Sizzling.  Humming.  Running water.  A musical note.  Tapping. Symptoms may affect only one ear (unilateral) or both ears (bilateral). How is this diagnosed? Tinnitus is diagnosed based on your symptoms, your medical history, and a physical exam. Your health care provider may do a thorough hearing test (audiologic exam) if your tinnitus:  Is unilateral.  Causes hearing difficulties.  Lasts 6 months or longer. You may work with a health care provider who specializes in hearing disorders (audiologist). You may be asked  questions about your symptoms and how they affect your daily life. You may have other tests done, such as:  CT scan.  MRI.  An imaging test of how blood flows through your blood vessels (angiogram). How is this treated? Treating an underlying medical condition can sometimes make tinnitus go away. If your tinnitus continues, other treatments may include:  Medicines.  Therapy and counseling to help you manage the stress of living with tinnitus.  Sound generators to mask the tinnitus. These include: ? Tabletop sound machines that play relaxing sounds to help you fall asleep. ? Wearable devices that fit in your ear and play sounds or music. ? Acoustic neural stimulation. This involves using headphones to listen to music that contains an auditory signal. Over time, listening to this signal may change some pathways in your brain and make you less sensitive to tinnitus. This treatment is used for very severe cases when no other treatment is working.  Using hearing aids or cochlear implants if your tinnitus is related to hearing loss. Hearing aids are worn in the outer ear. Cochlear implants are surgically placed in the inner ear. Follow these instructions at home: Managing symptoms  When possible, avoid being in loud places and being exposed to loud sounds.  Wear hearing protection, such as earplugs, when you are exposed to loud noises.  Use a white noise machine, a humidifier, or other devices to mask the sound of tinnitus.  Practice techniques for reducing stress, such as meditation, yoga, or deep breathing. Work with your health care provider if you need help with managing stress.  Sleep with your head slightly raised. This may reduce the impact of tinnitus.      General instructions  Do not use stimulants, such as nicotine, alcohol, or caffeine. Talk with your health care provider about other stimulants to avoid. Stimulants  are substances that can make you feel alert and attentive by  increasing certain activities in the body (such as heart rate and blood pressure). These substances may make tinnitus worse.  Take over-the-counter and prescription medicines only as told by your health care provider.  Try to get plenty of sleep each night.  Keep all follow-up visits as told by your health care provider. This is important. Contact a health care provider if:  Your tinnitus continues for 3 weeks or longer without stopping.  You develop sudden hearing loss.  Your symptoms get worse or do not get better with home care.  You feel you are not able to manage the stress of living with tinnitus. Get help right away if:  You develop tinnitus after a head injury.  You have tinnitus along with any of the following: ? Dizziness. ? Loss of balance. ? Nausea and vomiting. ? Sudden, severe headache. These symptoms may represent a serious problem that is an emergency. Do not wait to see if the symptoms will go away. Get medical help right away. Call your local emergency services (911 in the [place] Do not drive yourself to the hospital. Summary  Tinnitus refers to hearing a sound when there is no actual source for that sound. This is often described as ringing in the ears.  Symptoms may affect only one ear (unilateral) or both ears (bilateral).  Use a white noise machine, a humidifier, or other devices to mask the sound of tinnitus.  Do not use stimulants, such as nicotine, alcohol, or caffeine. Talk with your health care provider about other stimulants to avoid. These substances may make tinnitus worse. This information is not intended to replace advice given to you by your health care provider. Make sure you discuss any questions you have with your health care provider. Document Revised: 05/26/2019 Document Reviewed: 08/22/2017 Elsevier Patient Education  2021 [employer].

## 2021-03-17 ENCOUNTER — Ambulatory Visit: Payer: Medicare Other | Admitting: Podiatry

## 2021-05-11 ENCOUNTER — Ambulatory Visit: Payer: Medicare Other | Admitting: Internal Medicine

## 2021-07-17 ENCOUNTER — Telehealth: Payer: Self-pay | Admitting: Internal Medicine

## 2021-07-17 NOTE — Telephone Encounter (Signed)
PT daughter called to request a physical apt for the PT with anyone. PT states she needs it asap as it is to get some home health orders taken care of that require it. She stated that they would need it before Dr.Tracy was back and want to have it done with any provider that can. Please advise.

## 2021-07-18 NOTE — Telephone Encounter (Signed)
Scheduled to come in and see Carmen Humphrey

## 2021-07-20 ENCOUNTER — Encounter: Payer: Self-pay | Admitting: Podiatry

## 2021-07-20 ENCOUNTER — Other Ambulatory Visit: Payer: Self-pay

## 2021-07-20 ENCOUNTER — Ambulatory Visit (INDEPENDENT_AMBULATORY_CARE_PROVIDER_SITE_OTHER): Payer: Medicare Other | Admitting: Podiatry

## 2021-07-20 DIAGNOSIS — B351 Tinea unguium: Secondary | ICD-10-CM | POA: Diagnosis not present

## 2021-07-20 DIAGNOSIS — M79609 Pain in unspecified limb: Secondary | ICD-10-CM | POA: Diagnosis not present

## 2021-07-20 DIAGNOSIS — D689 Coagulation defect, unspecified: Secondary | ICD-10-CM | POA: Diagnosis not present

## 2021-07-20 NOTE — Progress Notes (Signed)
  Subjective:  Patient ID: Carmen Humphrey, female    DOB: 07-10-1925,  MRN: [id number]  Chief Complaint  Patient presents with   Nail Problem    Nail trim    87 y.o. female returns for the above complaint.  Patient presents with thickened elongated dystrophic toenails x10.  Patient would like to have them debrided down.  She states is painful to touch.  She states she is not able to take care of it herself.  Patient is on a blood thinner.  She denies any other acute complaints today.  She would like for me to debride them down.  Objective:   There were no vitals filed for this visit. Podiatric Exam: Vascular: dorsalis pedis and posterior tibial pulses are palpable bilateral. Capillary return is immediate. Temperature gradient is WNL. Skin turgor WNL  Sensorium: Normal Semmes Weinstein monofilament test. Normal tactile sensation bilaterally. Nail Exam: Pt has thick disfigured discolored nails with subungual debris noted bilateral entire nail hallux through fifth toenails Ulcer Exam: There is no evidence of ulcer or pre-ulcerative changes or infection. Orthopedic Exam: Muscle tone and strength are WNL. No limitations in general ROM. No crepitus or effusions noted. HAV  B/L.  Hammer toes 2-5  B/L. Skin: No Porokeratosis. No infection or ulcers.  Mild ingrown noted of the right hallux without any paronychia or clinical signs of infection.  Heloma molle noted to bilateral first and second digit facing sides.  Mild pain on palpation  Assessment & Plan:  Patient was evaluated and treated and all questions answered.  Bilateral severe bunion deformity with hammertoe contractures of 2 through 5 -The spacer and offloading with shoe gear modification has helped tremendously.  Patient still has some pain but much more manageable.  Patient just seems to continue to have the same discussion without any acute management.  She also has been noncompliant with conservative treatment options including applying  toe pressure is appropriately.  Right hallux mild ingrown -I explained to the patient the etiology of ingrown and likely the hallux valgus rotation of the big toe into the second toe is still primary cause of the pain associated with this.  Unfortunately I will hold off on removal given patient's age and the pain associated with this has to do with the biomechanical deformity of the toe.  I believe she will benefit from a toe spacer.  Toe spacer was dispensed.  Bilateral first and second facing surface heloma molle -I explained to the patient the etiology, heloma molle and various treatment options were discussed.  I have encouraged her to continue using toe protectors and spacers at this time.  This is very mild in nature.  Patient states understanding  Onychomycosis with pain  -Nails palliatively debrided as below. -Educated on self-care  Procedure: Nail Debridement Rationale: pain  Type of Debridement: manual, sharp debridement. Instrumentation: Nail nipper, rotary burr. Number of Nails: 10  Procedures and Treatment: Consent by patient was obtained for treatment procedures. The patient understood the discussion of treatment and procedures well. All questions were answered thoroughly reviewed. Debridement of mycotic and hypertrophic toenails, 1 through 5 bilateral and clearing of subungual debris. No ulceration, no infection noted.  Return Visit-Office Procedure: Patient instructed to return to the office for a follow up visit 3 months for continued evaluation and treatment.  Boneta Lucks, DPM    No follow-ups on file.      Boneta Lucks, DPM    No follow-ups on file.

## 2021-07-28 ENCOUNTER — Ambulatory Visit (INDEPENDENT_AMBULATORY_CARE_PROVIDER_SITE_OTHER): Payer: Medicare Other | Admitting: Family

## 2021-07-28 ENCOUNTER — Encounter: Payer: Self-pay | Admitting: Family

## 2021-07-28 ENCOUNTER — Other Ambulatory Visit: Payer: Self-pay

## 2021-07-28 DIAGNOSIS — Z9181 History of falling: Secondary | ICD-10-CM

## 2021-07-28 DIAGNOSIS — L989 Disorder of the skin and subcutaneous tissue, unspecified: Secondary | ICD-10-CM | POA: Diagnosis not present

## 2021-07-28 NOTE — Progress Notes (Signed)
Subjective:    Patient ID: Carmen Humphrey, female    DOB: 1925/08/29, 87 y.o.   MRN: [id number]  CC: Carmen Humphrey is a 87 y.o. female who presents today for follow up.   HPI: Accompanied by daughter, [name] today. Patient lives with daughter  [place] 4 months ago visiting daughter in [place]. No head injury.   Primary here to discuss home health aide.She would like aide that is there when [name] leaves the house.  [name] is able to bathe her, cook her meals.    She doesn't feel she needs physical therapist.   Complains of redness near right elbow, 2 days , improving. No bug bite. Itches.  No new medications, creams. No injury or fall to right arm.  Skin overall feels dry, flaky.  Last seen by PCP 12/2020 and discussed balance concerns. She is no longer on Eliquis ; this was stopped by vascular , Eulogio Ditch 10/24/2020   History of DVT HISTORY:  Past Medical History:  Diagnosis Date   Abdominal pain    ANA positive    1:320 08/09/16    Aortic atherosclerosis (HCC)    Arthritis    modreate right hips, C DD severe C5/6, b/l knees patellar enthesophytosis b/l    Bowel habit changes    pressure   Cancer (HCC)    Carotid stenosis    b/l mild 06/06/16   Chronic constipation    Colon polyps    Colon polyps    x2 removal   Depression    Fibromyalgia    Heart disease 2012   Hemorrhoids    History of fainting spells of unknown cause    HLD (hyperlipidemia)    Inflammatory polyps of colon ([place])    Myoclonic jerking    Poor appetite    Psoriasis    Right leg DVT ([place])    fall 09/2020 dx'ed [facility]   Shortness of breath dyspnea    Tinnitus    Urinary urgency    Vaginal polyp    Vitamin D deficiency    Vitamin D deficiency    Past Surgical History:  Procedure Laterality Date   BREAST SURGERY     left breast    ECTOPIC PREGNANCY SURGERY     ENDOMETRIAL BIOPSY     07/23/16 neg bx   fallopian tube removal     HYSTEROSCOPY WITH D & C N/A  07/23/2016   Procedure: DILATATION AND CURETTAGE;  Surgeon: Benjaman Kindler, MD;  Location: [facility] ORS;  Service: Gynecology;  Laterality: N/A;   right foot surgery      TONSILLECTOMY     TUBAL LIGATION     Family History  Problem Relation Age of Onset   Heart disease Mother    Hypertension Mother    Arthritis Mother    Heart disease Father    Hypertension Father     Allergies: Ciprofloxacin, Eliquis [apixaban], Fish allergy, Fish-derived products, and Shellfish-derived products Current Outpatient Medications on File Prior to Visit  Medication Sig Dispense Refill   Cholecalciferol (VITAMIN D3) 1000 units CAPS Take 1 capsule by mouth daily.     Coenzyme Q10-Fish Oil-Vit E (CO-Q 10 OMEGA-3 FISH OIL) CAPS Take 2 capsules by mouth daily.     NON FORMULARY [place] Blue Stuff     psyllium (METAMUCIL) 58.6 % powder Take 1 packet by mouth daily as needed.     No current facility-administered medications on file prior to visit.    Social History   Tobacco  Use   Smoking status: Never   Smokeless tobacco: Never  Substance Use Topics   Alcohol use: No    Alcohol/week: 0.0 standard drinks   Drug use: No    Review of Systems  Constitutional:  Negative for chills and fever.  Respiratory:  Negative for cough.   Cardiovascular:  Negative for chest pain and palpitations.  Gastrointestinal:  Negative for nausea and vomiting.  Musculoskeletal:  Negative for joint swelling.  Skin:  Positive for color change. Negative for rash and wound.     Objective:    BP 104/68 (BP Location: Left Arm, Patient Position: Sitting, Cuff Size: Large)   Pulse 66   Temp 98.6 F (37 C) (Oral)   Ht '5\' 6"'$  (1.676 m)   Wt 124 lb 12.8 oz (56.6 kg)   SpO2 98%   BMI 20.14 kg/m  BP Readings from Last 3 Encounters:  07/28/21 104/68  01/10/21 140/82  11/09/20 124/82   Wt Readings from Last 3 Encounters:  07/28/21 124 lb 12.8 oz (56.6 kg)  01/10/21 132 lb 3.2 oz (60 kg)  11/09/20 127 lb 6.4 oz (57.8 kg)     Physical Exam Vitals reviewed.  Constitutional:      Appearance: She is well-developed.  Eyes:     Conjunctiva/sclera: Conjunctivae normal.  Cardiovascular:     Rate and Rhythm: Normal rate and regular rhythm.     Pulses: Normal pulses.     Heart sounds: Normal heart sounds.  Pulmonary:     Effort: Pulmonary effort is normal.     Breath sounds: Normal breath sounds. No wheezing, rhonchi or rales.  Skin:    General: Skin is warm and dry.          Comments: Nontender discrete quarter size area of erythema as marked on right lateral forearm.  No increased heat.  Area is nonfluctuant.  No  macules, vesicles.  Full range of motion of right elbow.  No joint effusion appreciated  Neurological:     Mental Status: She is alert.  Psychiatric:        Speech: Speech normal.        Behavior: Behavior normal.        Thought Content: Thought content normal.       Assessment & Plan:   Problem List Items Addressed This Visit       Musculoskeletal and Integument   Skin lesion    Etiology of erythema nonspecific at this time.  No signs or symptoms to suggest infection at this time. question whether related to dry skin.  Advised to trial Neosporin to cover for any early bacterial skin infection.  Also advised she may use Vaseline, Eucerin for dry skin.  Close vigilance in regards to any spreading of the erythema, red streaks, increased warmth or discharge from the area.  Patient, daughter let me know right away        Other   At risk for falls    Patient looks exceptionally well today.  I had long conversation with both of them as it relates to her stage of life and certainly concern for falls and being left alone for long periods of time.  Daughter and patient declined home health nurse or physical therapist, they are looking  for home Health aide.  Unfortunately Medicare does not cover for this necessarily, and I recommended that they call a local company, [facility], for assistance.   Asked him to call me back if I can be of assistance in  any way or if she would consider a more formal referral for home health consult which would include a safety and falls assessment at home.        I am having Sonny Firmin maintain her CO-Q 10 Omega-3 Fish Oil, psyllium, Vitamin D3, and NON FORMULARY.   No orders of the defined types were placed in this encounter.   Return precautions given.   Risks, benefits, and alternatives of the medications and treatment plan prescribed today were discussed, and patient expressed understanding.   Education regarding symptom management and diagnosis given to patient on AVS.  Continue to follow with McLean-Scocuzza, Nino Glow, MD for routine health maintenance.   Monnie Wynetta Emery and I agreed with plan.   Mable Paris, FNP

## 2021-07-28 NOTE — Patient Instructions (Addendum)
Please use over the counter Neosporin to the areas of redness on your right elbow.  If the redness were to increase or you saw a red streaks, you felt warmth from that area or developed a fever, please let me know right away.  You may also use ice twice a day for 20 minutes for the slight swelling that is present.    For skin dryness, I will recommend an emollient such as Vaseline, coconut oil, Eucerin.  For care to consult with you while your daughter is away, I would recommend calling [facility] locally at [phone]  For visiting the website at:  [email or web address]  Nice to meet you

## 2021-08-02 DIAGNOSIS — L989 Disorder of the skin and subcutaneous tissue, unspecified: Secondary | ICD-10-CM | POA: Insufficient documentation

## 2021-08-02 NOTE — Assessment & Plan Note (Signed)
Patient looks exceptionally well today.  I had long conversation with both of them as it relates to her stage of life and certainly concern for falls and being left alone for long periods of time.  Daughter and patient declined home health nurse or physical therapist, they are looking  for home Health aide.  Unfortunately Medicare does not cover for this necessarily, and I recommended that they call a local company, [employer] [facility], for assistance.  Asked him to call me back if I can be of assistance in any way or if she would consider a more formal referral for home health consult which would include a safety and falls assessment at home.

## 2021-08-02 NOTE — Assessment & Plan Note (Signed)
Etiology of erythema nonspecific at this time.  No signs or symptoms to suggest infection at this time. question whether related to dry skin.  Advised to trial Neosporin to cover for any early bacterial skin infection.  Also advised she may use Vaseline, Eucerin for dry skin.  Close vigilance in regards to any spreading of the erythema, red streaks, increased warmth or discharge from the area.  Patient, daughter let me know right away

## 2021-09-21 ENCOUNTER — Ambulatory Visit (INDEPENDENT_AMBULATORY_CARE_PROVIDER_SITE_OTHER): Payer: Medicare Other | Admitting: Podiatry

## 2021-09-21 ENCOUNTER — Other Ambulatory Visit: Payer: Self-pay

## 2021-09-21 DIAGNOSIS — M79609 Pain in unspecified limb: Secondary | ICD-10-CM

## 2021-09-21 DIAGNOSIS — R200 Anesthesia of skin: Secondary | ICD-10-CM

## 2021-09-21 DIAGNOSIS — D689 Coagulation defect, unspecified: Secondary | ICD-10-CM | POA: Diagnosis not present

## 2021-09-21 DIAGNOSIS — B351 Tinea unguium: Secondary | ICD-10-CM | POA: Diagnosis not present

## 2021-09-21 DIAGNOSIS — R202 Paresthesia of skin: Secondary | ICD-10-CM | POA: Diagnosis not present

## 2021-09-21 MED ORDER — LIDOCAINE 5 % EX PTCH: 1.0000 | MEDICATED_PATCH | CUTANEOUS | 0 refills | Status: DC

## 2021-09-26 ENCOUNTER — Encounter: Payer: Self-pay | Admitting: Podiatry

## 2021-09-26 NOTE — Progress Notes (Signed)
Subjective:  Patient ID: Carmen Humphrey, female    DOB: November 22, 1925,  MRN: [id number]  No chief complaint on file.  87 y.o. female returns for the above complaint.  Patient presents with thickened elongated dystrophic toenails x10.  Patient would like to have them debrided down.  She states is painful to touch.  She states she is not able to take care of it herself.  Patient is on a blood thinner.  She has secondary complaint of numbness tingling to both of her feet.  She would like to discuss treatment options for this.  She has not seen anyone else prior to seeing me for this  Objective:   There were no vitals filed for this visit. Podiatric Exam: Vascular: dorsalis pedis and posterior tibial pulses are palpable bilateral. Capillary return is immediate. Temperature gradient is WNL. Skin turgor WNL  Sensorium: Sporadic numbness tingling noted.  Normal sinus monofilament testing noted.  Normal normal tactile sensation noted. Nail Exam: Pt has thick disfigured discolored nails with subungual debris noted bilateral entire nail hallux through fifth toenails Ulcer Exam: There is no evidence of ulcer or pre-ulcerative changes or infection. Orthopedic Exam: Muscle tone and strength are WNL. No limitations in general ROM. No crepitus or effusions noted. HAV  B/L.  Hammer toes 2-5  B/L. Skin: No Porokeratosis. No infection or ulcers.  Mild ingrown noted of the right hallux without any paronychia or clinical signs of infection.  Heloma molle noted to bilateral first and second digit facing sides.  Mild pain on palpation  Assessment & Plan:  Patient was evaluated and treated and all questions answered.  Numbness tingling to both of her feet -X-ray of the patient etiology of numbness tingling various treatment options were extensively discussed.  I believe patient will benefit from lidocaine patch to help with the numbness tingling.  She is already has taken gabapentin and it does not help. -Lidocaine  patch was sent to the pharmacy  Bilateral severe bunion deformity with hammertoe contractures of 2 through 5 -The spacer and offloading with shoe gear modification has helped tremendously.  Patient still has some pain but much more manageable.  Patient just seems to continue to have the same discussion without any acute management.  She also has been noncompliant with conservative treatment options including applying toe pressure is appropriately.  Right hallux mild ingrown -I explained to the patient the etiology of ingrown and likely the hallux valgus rotation of the big toe into the second toe is still primary cause of the pain associated with this.  Unfortunately I will hold off on removal given patient's age and the pain associated with this has to do with the biomechanical deformity of the toe.  I believe she will benefit from a toe spacer.  Toe spacer was dispensed.  Bilateral first and second facing surface heloma molle -I explained to the patient the etiology, heloma molle and various treatment options were discussed.  I have encouraged her to continue using toe protectors and spacers at this time.  This is very mild in nature.  Patient states understanding  Onychomycosis with pain  -Nails palliatively debrided as below. -Educated on self-care  Procedure: Nail Debridement Rationale: pain  Type of Debridement: manual, sharp debridement. Instrumentation: Nail nipper, rotary burr. Number of Nails: 10  Procedures and Treatment: Consent by patient was obtained for treatment procedures. The patient understood the discussion of treatment and procedures well. All questions were answered thoroughly reviewed. Debridement of mycotic and hypertrophic toenails, 1 through 5  bilateral and clearing of subungual debris. No ulceration, no infection noted.  Return Visit-Office Procedure: Patient instructed to return to the office for a follow up visit 3 months for continued evaluation and  treatment.  Boneta Lucks, DPM    No follow-ups on file.      Boneta Lucks, DPM    No follow-ups on file.

## 2021-10-06 ENCOUNTER — Other Ambulatory Visit: Payer: Self-pay

## 2021-10-06 ENCOUNTER — Ambulatory Visit (INDEPENDENT_AMBULATORY_CARE_PROVIDER_SITE_OTHER): Payer: Medicare Other | Admitting: Internal Medicine

## 2021-10-06 ENCOUNTER — Encounter: Payer: Self-pay | Admitting: Internal Medicine

## 2021-10-06 VITALS — BP 136/82 | HR 76 | Temp 97.0°F | Ht 66.0 in | Wt 128.0 lb

## 2021-10-06 DIAGNOSIS — H9313 Tinnitus, bilateral: Secondary | ICD-10-CM

## 2021-10-06 DIAGNOSIS — R946 Abnormal results of thyroid function studies: Secondary | ICD-10-CM

## 2021-10-06 DIAGNOSIS — Z13220 Encounter for screening for lipoid disorders: Secondary | ICD-10-CM | POA: Diagnosis not present

## 2021-10-06 DIAGNOSIS — R55 Syncope and collapse: Secondary | ICD-10-CM | POA: Diagnosis not present

## 2021-10-06 DIAGNOSIS — R7989 Other specified abnormal findings of blood chemistry: Secondary | ICD-10-CM | POA: Diagnosis not present

## 2021-10-06 DIAGNOSIS — N3000 Acute cystitis without hematuria: Secondary | ICD-10-CM

## 2021-10-06 DIAGNOSIS — M1990 Unspecified osteoarthritis, unspecified site: Secondary | ICD-10-CM

## 2021-10-06 LAB — CBC WITH DIFFERENTIAL/PLATELET
Basophils Absolute: 0.1 10*3/uL (ref 0.0–0.1)
Basophils Relative: 0.9 % (ref 0.0–3.0)
Eosinophils Absolute: 0 10*3/uL (ref 0.0–0.7)
Eosinophils Relative: 0.6 % (ref 0.0–5.0)
HCT: 39.3 % (ref 36.0–46.0)
Hemoglobin: 13.1 g/dL (ref 12.0–15.0)
Lymphocytes Relative: 22.2 % (ref 12.0–46.0)
Lymphs Abs: 1.4 10*3/uL (ref 0.7–4.0)
MCHC: 33.5 g/dL (ref 30.0–36.0)
MCV: 90.1 fl (ref 78.0–100.0)
Monocytes Absolute: 0.4 10*3/uL (ref 0.1–1.0)
Monocytes Relative: 6.3 % (ref 3.0–12.0)
Neutro Abs: 4.6 10*3/uL (ref 1.4–7.7)
Neutrophils Relative %: 70 % (ref 43.0–77.0)
Platelets: 260 10*3/uL (ref 150.0–400.0)
RBC: 4.36 Mil/uL (ref 3.87–5.11)
RDW: 12.9 % (ref 11.5–15.5)
WBC: 6.5 10*3/uL (ref 4.0–10.5)

## 2021-10-06 LAB — COMPREHENSIVE METABOLIC PANEL
ALT: 18 U/L (ref 0–35)
AST: 21 U/L (ref 0–37)
Albumin: 4.2 g/dL (ref 3.5–5.2)
Alkaline Phosphatase: 89 U/L (ref 39–117)
BUN: 9 mg/dL (ref 6–23)
CO2: 27 mEq/L (ref 19–32)
Calcium: 9.4 mg/dL (ref 8.4–10.5)
Chloride: 103 mEq/L (ref 96–112)
Creatinine, Ser: 0.67 mg/dL (ref 0.40–1.20)
GFR: 74.66 mL/min (ref >60.00)
Glucose, Bld: 92 mg/dL (ref 70–99)
Potassium: 4.5 mEq/L (ref 3.5–5.1)
Sodium: 138 mEq/L (ref 135–145)
Total Bilirubin: 1.2 mg/dL (ref 0.2–1.2)
Total Protein: 6.7 g/dL (ref 6.0–8.3)

## 2021-10-06 LAB — LIPID PANEL
Cholesterol: 265 mg/dL — ABNORMAL HIGH (ref 0–200)
HDL: 74.4 mg/dL (ref >39.00)
LDL Cholesterol: 174 mg/dL — ABNORMAL HIGH (ref 0–99)
NonHDL: 190.55
Total CHOL/HDL Ratio: 4
Triglycerides: 82 mg/dL (ref 0.0–149.0)
VLDL: 16.4 mg/dL (ref 0.0–40.0)

## 2021-10-06 LAB — TSH: TSH: 2.71 u[IU]/mL (ref 0.35–5.50)

## 2021-10-06 NOTE — Progress Notes (Signed)
Chief Complaint  Patient presents with   Follow-up   F/u with daughter Carmen Humphrey  1. C/o tinnitis chronic  2. C/o arthritis does not want to try medication  3. Declines vaccines for now  4. Had syncope and uti over the summer in [place] likely due to lack of fluids per daughter no further episodes  Review of Systems  Constitutional:  Negative for weight loss.  HENT:  Positive for tinnitus. Negative for hearing loss.   Eyes:  Negative for blurred vision.  Respiratory:  Negative for shortness of breath.   Cardiovascular:  Negative for chest pain.  Gastrointestinal:  Negative for abdominal pain and blood in stool.  Genitourinary:  Negative for dysuria.  Musculoskeletal:  Negative for falls and joint pain.  Skin:  Negative for rash.  Neurological:  Negative for loss of consciousness and headaches.  Psychiatric/Behavioral:  Negative for depression.   Past Medical History:  Diagnosis Date   Abdominal pain    ANA positive    1:320 08/09/16    Aortic atherosclerosis (HCC)    Arthritis    modreate right hips, C DD severe C5/6, b/l knees patellar enthesophytosis b/l    Bowel habit changes    pressure   Cancer (HCC)    Carotid stenosis    b/l mild 06/06/16   Chronic constipation    Colon polyps    Colon polyps    x2 removal   Depression    Fibromyalgia    Heart disease 2012   Hemorrhoids    History of fainting spells of unknown cause    HLD (hyperlipidemia)    Inflammatory polyps of colon ([place])    Myoclonic jerking    Poor appetite    Psoriasis    Right leg DVT ([place])    fall 09/2020 [facility]ed [facility]   Shortness of breath dyspnea    Syncope    summer 2022   Tinnitus    Urinary urgency    UTI (urinary tract infection)    summer 2022   Vaginal polyp    Vitamin D deficiency    Vitamin D deficiency    Past Surgical History:  Procedure Laterality Date   BREAST SURGERY     left breast    ECTOPIC PREGNANCY SURGERY     ENDOMETRIAL BIOPSY     07/23/16 neg bx    fallopian tube removal     HYSTEROSCOPY WITH D & C N/A 07/23/2016   Procedure: DILATATION AND CURETTAGE;  Surgeon: Benjaman Kindler, MD;  Location: [facility] ORS;  Service: Gynecology;  Laterality: N/A;   right foot surgery      TONSILLECTOMY     TUBAL LIGATION     Family History  Problem Relation Age of Onset   Heart disease Mother    Hypertension Mother    Arthritis Mother    Heart disease Father    Hypertension Father    Social History   Socioeconomic History   Marital status: Widowed    Spouse name: Not on file   Number of children: Not on file   Years of education: Not on file   Highest education level: Not on file  Occupational History   Not on file  Tobacco Use   Smoking status: Never   Smokeless tobacco: Never  Substance and Sexual Activity   Alcohol use: No    Alcohol/week: 0.0 standard drinks   Drug use: No   Sexual activity: Not Currently  Other Topics Concern   Not on file  Social  History Narrative   Lives with daughter [name]    Has 2 daughters    From [place]   58 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   Coenzyme Q10-Fish Oil-Vit E (CO-Q 10 OMEGA-3 FISH OIL) CAPS Take 2 capsules by mouth daily.   NON FORMULARY [place] Blue Stuff   psyllium (METAMUCIL) 58.6 % powder Take 1 packet by mouth daily as needed.   Allergies  Allergen Reactions   Ciprofloxacin Nausea And Vomiting   Eliquis [Apixaban] Swelling    Tongue swelling and numbness    Fish Allergy Other (See Comments)    Perch Fish   Fish-Derived Products Other (See Comments)    Perch Fish    Shellfish-Derived Products    No results found for this or any previous visit (from the past 2160 hour(s)). Objective  Body mass index is 20.66 kg/m. Wt Readings from Last 3 Encounters:  10/06/21 128 lb (58.1 kg)  07/28/21 124 lb 12.8 oz (56.6 kg)  01/10/21 132 lb 3.2 oz (60 kg)   Temp Readings from Last 3 Encounters:  10/06/21 (!) 97 F (36.1 C) (Temporal)  07/28/21 98.6 F (37 C) (Oral)  11/09/20 97.9 F (36.6 C) (Oral)   BP Readings from Last 3 Encounters:  10/06/21 136/82  07/28/21 104/68  01/10/21 140/82   Pulse Readings from Last 3 Encounters:  10/06/21 76  07/28/21 66  01/10/21 79    Physical Exam Vitals and nursing note reviewed.  Constitutional:      Appearance: Normal appearance. She is well-developed and well-groomed.  HENT:     Head: Normocephalic and atraumatic.  Eyes:     Conjunctiva/sclera: Conjunctivae normal.     Pupils: Pupils are equal, round, and reactive to light.  Cardiovascular:     Rate and Rhythm: Normal rate and regular rhythm.     Heart sounds: Normal heart sounds. No murmur heard. Pulmonary:     Effort: Pulmonary effort is normal.     Breath sounds: Normal breath sounds.  Abdominal:     General: Abdomen is flat. Bowel sounds are normal.     Tenderness: There is no abdominal tenderness.  Musculoskeletal:        General: No tenderness.  Skin:    General: Skin is warm and dry.  Neurological:     General: No focal deficit present.     Mental Status: She is alert and oriented to person, place, and time. Mental status is at baseline.     Cranial Nerves: Cranial nerves 2-12 are intact.     Gait: Gait is intact.  Psychiatric:        Attention and Perception: Attention and perception normal.        Mood and Affect: Mood and affect normal.        Speech: Speech normal.        Behavior: Behavior normal. Behavior is cooperative.        Thought Content: Thought content normal.        Cognition and Memory: Cognition and memory normal.  Judgment: Judgment normal.    Assessment  Plan  Tinnitus of both ears Consider ent  Syncope, summer 2022 due to uti and dehydration - Plan: Comprehensive metabolic panel, CBC with Differential/Platelet  Acute cystitis without hematuria - Plan: Urinalysis, Routine w reflex microscopic, Urine Culture  Abnormal thyroid function test - Plan: TSH  Arthritis  Tylenol arthritis  Aspercream with lidocaine Lidocaine pain patch   HM Declines flu shot  Pfizer 2/2 disc booster  Reviewed 11/01/20 [facility] (vaccines prev declined vaccines (tdap, prevnar, flu), imaging I.e echo reviewed nl mild DD 11/2019, notes)    Out of age window colonoscopy (fobt neg x3 06/13/20), dexa, pap, mammogram     Provider: Dr. Olivia Mackie McLean-Scocuzza-Internal Medicine

## 2021-10-06 NOTE — Patient Instructions (Addendum)
Tylenol arthritis  Aspercream with lidocaine Lidocaine pain patch  Ringing in the ear: Use a white noise machine, a humidifier, Sleep with your head slightly raised. This may reduce the impact of tinnitus.  Dr. Guy Sandifer podiatry   Think about new covid 19 vaccine   Tinnitus Tinnitus refers to hearing a sound when there is no actual source for that sound. This is often described as ringing in the ears. However, people with this condition may hear a variety of noises, in one ear or in both ears. The sounds of tinnitus can be soft, loud, or somewhere in between. Tinnitus can last for a few seconds or can be constant for days. It may go away without treatment and come back at various times. When tinnitus is constant or happens often, it can lead to other problems, such as trouble sleeping and trouble concentrating. Almost everyone experiences tinnitus at some point. Tinnitus is not the same as hearing loss. Tinnitus that is long-lasting (chronic) or comes back often (recurs) may require medical attention. What are the causes? The cause of tinnitus is often not known. In some cases, it can result from: Exposure to loud noises from machinery, music, or other sources. An object (foreign body) stuck in the ear. Earwax buildup. Drinking alcohol or caffeine. Taking certain medicines. Age-related hearing loss. It may also be caused by medical conditions such as: Ear or sinus infections. Heart diseases or high blood pressure. Allergies. Mnire's disease. Thyroid problems. Tumors. A weak, bulging blood vessel (aneurysm) near the ear. What increases the risk? The following factors may make you more likely to develop this condition: Exposure to loud noises. Age. Tinnitus is more likely in older individuals. Using alcohol or tobacco. What are the signs or symptoms? The main symptom of tinnitus is hearing a sound when there is no source for that sound. It may sound  like: Buzzing. Sizzling. Ringing. Blowing air. Hissing. Whistling. Other sounds may include: Roaring. Running water. A musical note. Tapping. Humming. Symptoms may affect only one ear (unilateral) or both ears (bilateral). How is this diagnosed? Tinnitus is diagnosed based on your symptoms, your medical history, and a physical exam. Your health care provider may do a thorough hearing test (audiologic exam) if your tinnitus: Is unilateral. Causes hearing difficulties. Lasts 6 months or longer. You may work with a health care provider who specializes in hearing disorders (audiologist). You may be asked questions about your symptoms and how they affect your daily life. You may have other tests done, such as: CT scan. MRI. An imaging test of how blood flows through your blood vessels (angiogram). How is this treated? Treating an underlying medical condition can sometimes make tinnitus go away. If your tinnitus continues, other treatments may include: Therapy and counseling to help you manage the stress of living with tinnitus. Sound generators to mask the tinnitus. These include: Tabletop sound machines that play relaxing sounds to help you fall asleep. Wearable devices that fit in your ear and play sounds or music. Acoustic neural stimulation. This involves using headphones to listen to music that contains an auditory signal. Over time, listening to this signal may change some pathways in your brain and make you less sensitive to tinnitus. This treatment is used for very severe cases when no other treatment is working. Using hearing aids or cochlear implants if your tinnitus is related to hearing loss. Hearing aids are worn in the outer ear. Cochlear implants are surgically placed in the inner ear. Follow these instructions at home:  Managing symptoms   When possible, avoid being in loud places and being exposed to loud sounds. Wear hearing protection, such as earplugs, when you are  exposed to loud noises. Use a white noise machine, a humidifier, or other devices to mask the sound of tinnitus. Practice techniques for reducing stress, such as meditation, yoga, or deep breathing. Work with your health care provider if you need help with managing stress. Sleep with your head slightly raised. This may reduce the impact of tinnitus. General instructions Do not use stimulants, such as nicotine, alcohol, or caffeine. Talk with your health care provider about other stimulants to avoid. Stimulants are substances that can make you feel alert and attentive by increasing certain activities in the body (such as heart rate and blood pressure). These substances may make tinnitus worse. Take over-the-counter and prescription medicines only as told by your health care provider. Try to get plenty of sleep each night. Keep all follow-up visits. This is important. Contact a health care provider if: Your tinnitus continues for 3 weeks or longer without stopping. You develop sudden hearing loss. Your symptoms get worse or do not get better with home care. You feel you are not able to manage the stress of living with tinnitus. Get help right away if: You develop tinnitus after a head injury. You have tinnitus along with any of the following: Dizziness. Nausea and vomiting. Loss of balance. Sudden, severe headache. Vision changes. Facial weakness or weakness of arms or legs. These symptoms may represent a serious problem that is an emergency. Do not wait to see if the symptoms will go away. Get medical help right away. Call your local emergency services (911 in the [place] Do not drive yourself to the hospital. Summary Tinnitus refers to hearing a sound when there is no actual source for that sound. This is often described as ringing in the ears. Symptoms may affect only one ear (unilateral) or both ears (bilateral). Use a white noise machine, a humidifier, or other devices to mask the sound  of tinnitus. Do not use stimulants, such as nicotine, alcohol, or caffeine. These substances may make tinnitus worse. This information is not intended to replace advice given to you by your health care provider. Make sure you discuss any questions you have with your health care provider. Document Revised: 10/17/2020 Document Reviewed: 10/17/2020 Elsevier Patient Education  2022 [place] is a term that is commonly used to refer to joint pain or joint disease. There are more than 100 types of arthritis. What are the causes? The most common cause of this condition is wear and tear of a joint. Other causes include: Gout. Inflammation of a joint. An infection of a joint. Sprains and other injuries near the joint. A reaction to medicines or drugs, or an allergic reaction. In some cases, the cause may not be known. What are the signs or symptoms? The main symptom of this condition is pain in the joint during movement. Other symptoms include: Redness, swelling, or stiffness at a joint. Warmth coming from the joint. Fever. Overall feeling of illness. How is this diagnosed? This condition may be diagnosed with a physical exam and tests, including: Blood tests. Urine tests. Imaging tests, such as X-rays, an MRI, or a CT scan. Sometimes, fluid is removed from a joint for testing. How is this treated? This condition may be treated with: Treatment of the cause, if it is known. Rest. Raising (elevating) the joint. Applying cold or hot packs  to the joint. Medicines to improve symptoms and reduce inflammation. Injections of a steroid such as cortisone into the joint to help reduce pain and inflammation. Depending on the cause of your arthritis, you may need to make lifestyle changes to reduce stress on your joint. Changes may include: Exercising more. Losing weight. Follow these instructions at home: Medicines Take over-the-counter and prescription medicines only  as told by your health care provider. Do not take aspirin to relieve pain if your health care provider thinks that gout may be causing your pain. Activity Rest your joint if told by your health care provider. Rest is important when your disease is active and your joint feels painful, swollen, or stiff. Avoid activities that make the pain worse. It is important to balance activity with rest. Exercise your joint regularly with range-of-motion exercises as told by your health care provider. Try doing low-impact exercise, such as: Swimming. Water aerobics. Biking. Walking. Managing pain, stiffness, and swelling   If directed, put ice on the joint. Put ice in a plastic bag. Place a towel between your skin and the bag. Leave the ice on for 20 minutes, 2-3 times per day. If your joint is swollen, raise (elevate) it above the level of your heart if directed by your health care provider. If your joint feels stiff in the morning, try taking a warm shower. If directed, apply heat to the affected area as often as told by your health care provider. Use the heat source that your health care provider recommends, such as a moist heat pack or a heating pad. If you have diabetes, do not apply heat without permission from your health care provider. To apply heat: Place a towel between your skin and the heat source. Leave the heat on for 20-30 minutes. Remove the heat if your skin turns bright red. This is especially important if you are unable to feel pain, heat, or cold. You may have a greater risk of getting burned. General instructions Do not use any products that contain nicotine or tobacco, such as cigarettes, e-cigarettes, and chewing tobacco. If you need help quitting, ask your health care provider. Keep all follow-up visits as told by your health care provider. This is important. Contact a health care provider if: The pain gets worse. You have a fever. Get help right away if: You develop severe  joint pain, swelling, or redness. Many joints become painful and swollen. You develop severe back pain. You develop severe weakness in your leg. You cannot control your bladder or bowels. Summary Arthritis is a term that is commonly used to refer to joint pain or joint disease. There are more than 100 types of arthritis. The most common cause of this condition is wear and tear of a joint. Other causes include gout, inflammation or infection of the joint, sprains, or allergies. Symptoms of this condition include redness, swelling, or stiffness of the joint. Other symptoms include warmth, fever, or feeling ill. This condition is treated with rest, elevation, medicines, and applying cold or hot packs. Follow your health care provider's instructions about medicines, activity, exercises, and other home care treatments. This information is not intended to replace advice given to you by your health care provider. Make sure you discuss any questions you have with your health care provider. Document Revised: 10/20/2018 Document Reviewed: 10/20/2018 Elsevier Patient Education  2022 [employer].

## 2021-10-08 LAB — URINE CULTURE
MICRO NUMBER:: 12626653
SPECIMEN QUALITY:: ADEQUATE

## 2021-10-08 LAB — URINALYSIS, ROUTINE W REFLEX MICROSCOPIC
Bacteria, UA: NONE SEEN /HPF
Bilirubin Urine: NEGATIVE
Glucose, UA: NEGATIVE
Hgb urine dipstick: NEGATIVE
Hyaline Cast: NONE SEEN /LPF
Ketones, ur: NEGATIVE
Nitrite: NEGATIVE
Protein, ur: NEGATIVE
RBC / HPF: NONE SEEN /HPF (ref 0–2)
Specific Gravity, Urine: 1.003 (ref 1.001–1.035)
Squamous Epithelial / HPF: NONE SEEN /HPF (ref <=5)
WBC, UA: NONE SEEN /HPF (ref 0–5)
pH: 7 (ref 5.0–8.0)

## 2021-10-09 NOTE — Progress Notes (Signed)
No UTI Liver kidneys normal  Cholesterol worse and elevated  -I would rec. Statin cholesterol medication but pt may not accept due to concern of side effects  Thyroid lab normal and blood cts normal

## 2021-10-24 ENCOUNTER — Ambulatory Visit: Payer: Medicare Other | Admitting: Podiatry

## 2021-12-05 ENCOUNTER — Other Ambulatory Visit: Payer: Self-pay

## 2021-12-05 ENCOUNTER — Ambulatory Visit (INDEPENDENT_AMBULATORY_CARE_PROVIDER_SITE_OTHER): Payer: Medicare Other | Admitting: Podiatry

## 2021-12-05 ENCOUNTER — Encounter: Payer: Self-pay | Admitting: Podiatry

## 2021-12-05 DIAGNOSIS — B351 Tinea unguium: Secondary | ICD-10-CM | POA: Diagnosis not present

## 2021-12-05 DIAGNOSIS — M79609 Pain in unspecified limb: Secondary | ICD-10-CM

## 2021-12-05 DIAGNOSIS — D689 Coagulation defect, unspecified: Secondary | ICD-10-CM

## 2021-12-05 MED ORDER — LIDOCAINE 5 % EX PTCH: 1.0000 | MEDICATED_PATCH | CUTANEOUS | 0 refills | Status: DC

## 2021-12-05 NOTE — Progress Notes (Signed)
Subjective:  Patient ID: Carmen Humphrey, female    DOB: 11/16/1925,  MRN: [id number]  Chief Complaint  Patient presents with   Foot Pain    Pt stated that she is still having some pain     88 y.o. female returns for the above complaint.  Patient presents with thickened elongated dystrophic toenails x10.  Patient would like to have them debrided down.  She states is painful to touch.  She states she is not able to take care of it herself.  Patient is on a blood thinner.  She denies any secondary complaint today.  She would like a refill on her lidocaine ointment  Objective:   There were no vitals filed for this visit. Podiatric Exam: Vascular: dorsalis pedis and posterior tibial pulses are palpable bilateral. Capillary return is immediate. Temperature gradient is WNL. Skin turgor WNL  Sensorium: Sporadic numbness tingling noted.  Normal sinus monofilament testing noted.  Normal normal tactile sensation noted. Nail Exam: Pt has thick disfigured discolored nails with subungual debris noted bilateral entire nail hallux through fifth toenails Ulcer Exam: There is no evidence of ulcer or pre-ulcerative changes or infection. Orthopedic Exam: Muscle tone and strength are WNL. No limitations in general ROM. No crepitus or effusions noted. HAV  B/L.  Hammer toes 2-5  B/L. Skin: No Porokeratosis. No infection or ulcers.  Mild ingrown noted of the right hallux without any paronychia or clinical signs of infection.  Heloma molle noted to bilateral first and second digit facing sides.  Mild pain on palpation  Assessment & Plan:  Patient was evaluated and treated and all questions answered.  Numbness tingling to both of her feet -X-ray of the patient etiology of numbness tingling various treatment options were extensively discussed.  I believe patient will benefit from lidocaine patch to help with the numbness tingling.  She is already has taken gabapentin and it does not help. -Lidocaine ointment was  sent to the pharmacy again.   Bilateral severe bunion deformity with hammertoe contractures of 2 through 5 -The spacer and offloading with shoe gear modification has helped tremendously.  Patient still has some pain but much more manageable.  Patient just seems to continue to have the same discussion without any acute management.  She also has been noncompliant with conservative treatment options including applying toe pressure is appropriately.  Right hallux mild ingrown -I explained to the patient the etiology of ingrown and likely the hallux valgus rotation of the big toe into the second toe is still primary cause of the pain associated with this.  Unfortunately I will hold off on removal given patient's age and the pain associated with this has to do with the biomechanical deformity of the toe.  I believe she will benefit from a toe spacer.  Toe spacer was dispensed.  Bilateral first and second facing surface heloma molle -I explained to the patient the etiology, heloma molle and various treatment options were discussed.  I have encouraged her to continue using toe protectors and spacers at this time.  This is very mild in nature.  Patient states understanding  Onychomycosis with pain  -Nails palliatively debrided as below. -Educated on self-care  Procedure: Nail Debridement Rationale: pain  Type of Debridement: manual, sharp debridement. Instrumentation: Nail nipper, rotary burr. Number of Nails: 10  Procedures and Treatment: Consent by patient was obtained for treatment procedures. The patient understood the discussion of treatment and procedures well. All questions were answered thoroughly reviewed. Debridement of mycotic and hypertrophic toenails,  1 through 5 bilateral and clearing of subungual debris. No ulceration, no infection noted.  Return Visit-Office Procedure: Patient instructed to return to the office for a follow up visit 3 months for continued evaluation and  treatment.  Boneta Lucks, DPM    No follow-ups on file.      Boneta Lucks, DPM    No follow-ups on file.

## 2021-12-06 ENCOUNTER — Telehealth: Payer: Self-pay

## 2021-12-06 ENCOUNTER — Telehealth: Payer: Self-pay | Admitting: *Deleted

## 2021-12-06 MED ORDER — LIDOCAINE 5 % EX OINT: 1.0000 "application " | TOPICAL_OINTMENT | CUTANEOUS | 0 refills | Status: DC | PRN

## 2021-12-06 NOTE — Telephone Encounter (Signed)
Daughter called and stated that the Lidocaine ointment that was sent in yesterday was $200 and insurance will not cover medication.  She is wanting to know if there is something else that can be called in instead   Please advise

## 2021-12-06 NOTE — Telephone Encounter (Signed)
Called and switched  prescription to lidocaine ointment instead of the patches per Dr Posey Pronto, contacted the pharmacy for this change as well.New prescription has been sent to pharmacy on file.

## 2021-12-12 ENCOUNTER — Ambulatory Visit (INDEPENDENT_AMBULATORY_CARE_PROVIDER_SITE_OTHER): Payer: Medicare Other | Admitting: Internal Medicine

## 2021-12-12 ENCOUNTER — Other Ambulatory Visit: Payer: Self-pay | Admitting: Podiatry

## 2021-12-12 ENCOUNTER — Encounter: Payer: Self-pay | Admitting: Internal Medicine

## 2021-12-12 ENCOUNTER — Other Ambulatory Visit: Payer: Self-pay

## 2021-12-12 VITALS — BP 136/74 | HR 80 | Temp 96.9°F | Ht 66.0 in | Wt 128.2 lb

## 2021-12-12 DIAGNOSIS — R42 Dizziness and giddiness: Secondary | ICD-10-CM

## 2021-12-12 DIAGNOSIS — W19XXXD Unspecified fall, subsequent encounter: Secondary | ICD-10-CM

## 2021-12-12 DIAGNOSIS — I6381 Other cerebral infarction due to occlusion or stenosis of small artery: Secondary | ICD-10-CM

## 2021-12-12 DIAGNOSIS — W19XXXA Unspecified fall, initial encounter: Secondary | ICD-10-CM | POA: Insufficient documentation

## 2021-12-12 DIAGNOSIS — R55 Syncope and collapse: Secondary | ICD-10-CM

## 2021-12-12 DIAGNOSIS — F0390 Unspecified dementia without behavioral disturbance: Secondary | ICD-10-CM

## 2021-12-12 DIAGNOSIS — R269 Unspecified abnormalities of gait and mobility: Secondary | ICD-10-CM

## 2021-12-12 DIAGNOSIS — H9313 Tinnitus, bilateral: Secondary | ICD-10-CM

## 2021-12-12 DIAGNOSIS — R413 Other amnesia: Secondary | ICD-10-CM

## 2021-12-12 NOTE — Patient Instructions (Addendum)
[facility]   [address], [place] [id number]   [phone]   Ray Church, [place]   [facility]   [place], [place] [address]   [phone] (Work)   [phone] (Fax)    Neurology   Rec. Water intake at least 50 ounces   Call me back if you want to see ENT (Ear nose and throat)   At [facility] $15 for lidocaine topical numbing ointment at [facility] $70   Or can try aspercream with lidocaine   Or can try voltaren gel     Tinnitus Tinnitus refers to hearing a sound when there is no actual source for that sound. This is often described as ringing in the ears. However, people with this condition may hear a variety of noises, in one ear or in both ears. The sounds of tinnitus can be soft, loud, or somewhere in between. Tinnitus can last for a few seconds or can be constant for days. It may go away without treatment and come back at various times. When tinnitus is constant or happens often, it can lead to other problems, such as trouble sleeping and trouble concentrating. Almost everyone experiences tinnitus at some point. Tinnitus is not the same as hearing loss. Tinnitus that is long-lasting (chronic) or comes back often (recurs) may require medical attention. What are the causes? The cause of tinnitus is often not known. In some cases, it can result from: Exposure to loud noises from machinery, music, or other sources. An object (foreign body) stuck in the ear. Earwax buildup. Drinking alcohol or caffeine. Taking certain medicines. Age-related hearing loss. It may also be caused by medical conditions such as: Ear or sinus infections. Heart diseases or high blood pressure. Allergies. Mnire's disease. Thyroid problems. Tumors. A weak, bulging blood vessel (aneurysm) near the ear. What increases the risk? The following factors may make you more likely to develop this condition: Exposure to loud  noises. Age. Tinnitus is more likely in older individuals. Using alcohol or tobacco. What are the signs or symptoms? The main symptom of tinnitus is hearing a sound when there is no source for that sound. It may sound like: Buzzing. Sizzling. Ringing. Blowing air. Hissing. Whistling. Other sounds may include: Roaring. Running water. A musical note. Tapping. Humming. Symptoms may affect only one ear (unilateral) or both ears (bilateral). How is this diagnosed? Tinnitus is diagnosed based on your symptoms, your medical history, and a physical exam. Your health care provider may do a thorough hearing test (audiologic exam) if your tinnitus: Is unilateral. Causes hearing difficulties. Lasts 6 months or longer. You may work with a health care provider who specializes in hearing disorders (audiologist). You may be asked questions about your symptoms and how they affect your daily life. You may have other tests done, such as: CT scan. MRI. An imaging test of how blood flows through your blood vessels (angiogram). How is this treated? Treating an underlying medical condition can sometimes make tinnitus go away. If your tinnitus continues, other treatments may include: Therapy and counseling to help you manage the stress of living with tinnitus. Sound generators to mask the tinnitus. These include: Tabletop sound machines that play relaxing sounds to help you fall asleep. Wearable devices that fit in your ear and play sounds or music. Acoustic neural stimulation. This involves using headphones to listen to music that contains an auditory signal. Over time, listening to this signal may change some pathways in  your brain and make you less sensitive to tinnitus. This treatment is used for very severe cases when no other treatment is working. Using hearing aids or cochlear implants if your tinnitus is related to hearing loss. Hearing aids are worn in the outer ear. Cochlear implants are  surgically placed in the inner ear. Follow these instructions at home: Managing symptoms   When possible, avoid being in loud places and being exposed to loud sounds. Wear hearing protection, such as earplugs, when you are exposed to loud noises. Use a white noise machine, a humidifier, or other devices to mask the sound of tinnitus. Practice techniques for reducing stress, such as meditation, yoga, or deep breathing. Work with your health care provider if you need help with managing stress. Sleep with your head slightly raised. This may reduce the impact of tinnitus. General instructions Do not use stimulants, such as nicotine, alcohol, or caffeine. Talk with your health care provider about other stimulants to avoid. Stimulants are substances that can make you feel alert and attentive by increasing certain activities in the body (such as heart rate and blood pressure). These substances may make tinnitus worse. Take over-the-counter and prescription medicines only as told by your health care provider. Try to get plenty of sleep each night. Keep all follow-up visits. This is important. Contact a health care provider if: Your tinnitus continues for 3 weeks or longer without stopping. You develop sudden hearing loss. Your symptoms get worse or do not get better with home care. You feel you are not able to manage the stress of living with tinnitus. Get help right away if: You develop tinnitus after a head injury. You have tinnitus along with any of the following: Dizziness. Nausea and vomiting. Loss of balance. Sudden, severe headache. Vision changes. Facial weakness or weakness of arms or legs. These symptoms may represent a serious problem that is an emergency. Do not wait to see if the symptoms will go away. Get medical help right away. Call your local emergency services (911 in the [place] Do not drive yourself to the hospital. Summary Tinnitus refers to hearing a sound when there is no  actual source for that sound. This is often described as ringing in the ears. Symptoms may affect only one ear (unilateral) or both ears (bilateral). Use a white noise machine, a humidifier, or other devices to mask the sound of tinnitus. Do not use stimulants, such as nicotine, alcohol, or caffeine. These substances may make tinnitus worse. This information is not intended to replace advice given to you by your health care provider. Make sure you discuss any questions you have with your health care provider. Document Revised: 10/17/2020 Document Reviewed: 10/17/2020 Elsevier Patient Education  2022 [employer].

## 2021-12-12 NOTE — Progress Notes (Signed)
Chief Complaint  Patient presents with   Altered Mental Status   F/u with daughter [name]  1. Dementia est neurology not seen since 2019  2. Dizziness on and off and falls and pre(syncope)  3. Falls walks with cane at times mostly not  Review of Systems  Constitutional:  Negative for weight loss.  HENT:  Negative for hearing loss.   Eyes:  Negative for blurred vision.  Respiratory:  Negative for shortness of breath.   Cardiovascular:  Negative for chest pain.  Gastrointestinal:  Negative for abdominal pain and blood in stool.  Genitourinary:  Negative for dysuria.  Musculoskeletal:  Positive for falls. Negative for joint pain.  Skin:  Negative for rash.  Neurological:  Positive for dizziness. Negative for headaches.       +tinnitis   Psychiatric/Behavioral:  Negative for depression.   Past Medical History:  Diagnosis Date   Abdominal pain    ANA positive    1:320 08/09/16    Aortic atherosclerosis (HCC)    Arthritis    modreate right hips, C DD severe C5/6, b/l knees patellar enthesophytosis b/l    Bowel habit changes    pressure   Cancer (HCC)    Carotid stenosis    b/l mild 06/06/16   Chronic constipation    Colon polyps    Colon polyps    x2 removal   Depression    Fibromyalgia    Heart disease 2012   Hemorrhoids    History of fainting spells of unknown cause    HLD (hyperlipidemia)    Inflammatory polyps of colon ([place])    Myoclonic jerking    Poor appetite    Psoriasis    Right leg DVT ([place])    fall 09/2020 dx'ed [facility]   Shortness of breath dyspnea    Syncope    summer 2022   Tinnitus    Urinary urgency    UTI (urinary tract infection)    summer 2022   Vaginal polyp    Vitamin D deficiency    Vitamin D deficiency    Past Surgical History:  Procedure Laterality Date   BREAST SURGERY     left breast    ECTOPIC PREGNANCY SURGERY     ENDOMETRIAL BIOPSY     07/23/16 neg bx   fallopian tube removal     HYSTEROSCOPY WITH D & C N/A  07/23/2016   Procedure: DILATATION AND CURETTAGE;  Surgeon: Benjaman Kindler, MD;  Location: [facility] ORS;  Service: Gynecology;  Laterality: N/A;   right foot surgery      TONSILLECTOMY     TUBAL LIGATION     Family History  Problem Relation Age of Onset   Heart disease Mother    Hypertension Mother    Arthritis Mother    Heart disease Father    Hypertension Father    Social History   Socioeconomic History   Marital status: Widowed    Spouse name: Not on file   Number of children: Not on file   Years of education: Not on file   Highest education level: Not on file  Occupational History   Not on file  Tobacco Use   Smoking status: Never   Smokeless tobacco: Never  Substance and Sexual Activity   Alcohol use: No    Alcohol/week: 0.0 standard drinks   Drug use: No   Sexual activity: Not Currently  Other Topics Concern   Not on file  Social History Narrative   Lives with daughter  [name]    Has 2 daughters    From [place]   67 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   Coenzyme Q10-Fish Oil-Vit E (CO-Q 10 OMEGA-3 FISH OIL) CAPS Take 2 capsules by mouth daily.   NON FORMULARY [place] Blue Stuff   psyllium (METAMUCIL) 58.6 % powder Take 1 packet by mouth daily as needed.   Allergies  Allergen Reactions   Ciprofloxacin Nausea And Vomiting   Eliquis [Apixaban] Swelling    Tongue swelling and numbness    Fish Allergy Other (See Comments)    Perch Fish   Fish-Derived Products Other (See Comments)    Perch Fish   [employer]    Recent Results (from the past 2160  hour(s))  Comprehensive metabolic panel     Status: None   Collection Time: 10/06/21 11:19 AM  Result Value Ref Range   Sodium 138 135 - 145 mEq/L   Potassium 4.5 3.5 - 5.1 mEq/L   Chloride 103 96 - 112 mEq/L   CO2 27 19 - 32 mEq/L   Glucose, Bld 92 70 - 99 mg/dL   BUN 9 6 - 23 mg/dL   Creatinine, Ser 0.67 0.40 - 1.20 mg/dL   Total Bilirubin 1.2 0.2 - 1.2 mg/dL   Alkaline Phosphatase 89 39 - 117 U/L   AST 21 0 - 37 U/L   ALT 18 0 - 35 U/L   Total Protein 6.7 6.0 - 8.3 g/dL   Albumin 4.2 3.5 - 5.2 g/dL   GFR 74.66 >60.00 mL/min    Comment: Calculated using the CKD-EPI Creatinine Equation (2021)   Calcium 9.4 8.4 - 10.5 mg/dL  Lipid panel     Status: Abnormal   Collection Time: 10/06/21 11:19 AM  Result Value Ref Range   Cholesterol 265 (H) 0 - 200 mg/dL    Comment: ATP III Classification       Desirable:  < 200 mg/dL               Borderline High:  200 - 239 mg/dL          High:  > = 240 mg/dL   Triglycerides 82.0 0.0 - 149.0 mg/dL    Comment: Normal:  <150 mg/dLBorderline High:  150 - 199 mg/dL   HDL 74.40 >39.00 mg/dL   VLDL 16.4 0.0 - 40.0 mg/dL   LDL Cholesterol 174 (H) 0 - 99 mg/dL   Total CHOL/HDL Ratio 4     Comment:                Men          Women1/2 Average Risk     3.4          3.3Average Risk          5.0          4.42X Average Risk          9.6          7.13X Average Risk          15.0  11.0                       NonHDL 190.55     Comment: NOTE:  Non-HDL goal should be 30 mg/dL higher than patient's LDL goal (i.e. LDL goal of < 70 mg/dL, would have non-HDL goal of < 100 mg/dL)  CBC with Differential/Platelet     Status: None   Collection Time: 10/06/21 11:19 AM  Result Value Ref Range   WBC 6.5 4.0 - 10.5 K/uL   RBC 4.36 3.87 - 5.11 Mil/uL   Hemoglobin 13.1 12.0 - 15.0 g/dL   HCT 39.3 36.0 - 46.0 %   MCV 90.1 78.0 - 100.0 fl   MCHC 33.5 30.0 - 36.0 g/dL   RDW 12.9 11.5 - 15.5 %   Platelets 260.0 150.0 - 400.0 K/uL   Neutrophils Relative % 70.0 43.0 -  77.0 %   Lymphocytes Relative 22.2 12.0 - 46.0 %   Monocytes Relative 6.3 3.0 - 12.0 %   Eosinophils Relative 0.6 0.0 - 5.0 %   Basophils Relative 0.9 0.0 - 3.0 %   Neutro Abs 4.6 1.4 - 7.7 K/uL   Lymphs Abs 1.4 0.7 - 4.0 K/uL   Monocytes Absolute 0.4 0.1 - 1.0 K/uL   Eosinophils Absolute 0.0 0.0 - 0.7 K/uL   Basophils Absolute 0.1 0.0 - 0.1 K/uL  TSH     Status: None   Collection Time: 10/06/21 11:19 AM  Result Value Ref Range   TSH 2.71 0.35 - 5.50 uIU/mL  Urinalysis, Routine w reflex microscopic     Status: Abnormal   Collection Time: 10/06/21 11:19 AM  Result Value Ref Range   Color, Urine YELLOW YELLOW   APPearance CLEAR CLEAR   Specific Gravity, Urine 1.003 1.001 - 1.035   pH 7.0 5.0 - 8.0   Glucose, UA NEGATIVE NEGATIVE   Bilirubin Urine NEGATIVE NEGATIVE   Ketones, ur NEGATIVE NEGATIVE   Hgb urine dipstick NEGATIVE NEGATIVE   Protein, ur NEGATIVE NEGATIVE   Nitrite NEGATIVE NEGATIVE   Leukocytes,Ua TRACE (A) NEGATIVE   WBC, UA NONE SEEN 0 - 5 /HPF   RBC / HPF NONE SEEN 0 - 2 /HPF   Squamous Epithelial / LPF NONE SEEN < OR = 5 /HPF   Bacteria, UA NONE SEEN NONE SEEN /HPF   Hyaline Cast NONE SEEN NONE SEEN /LPF  Urine Culture     Status: None   Collection Time: 10/06/21 11:19 AM   Specimen: Urine  Result Value Ref Range   MICRO NUMBER: [id number]    SPECIMEN QUALITY: Adequate    Sample Source NOT GIVEN    STATUS: FINAL    Result:      Mixed genital flora isolated. These superficial bacteria are not indicative of a urinary tract infection. No further organism identification is warranted on this specimen. If clinically indicated, recollect clean-catch, mid-stream urine and transfer  immediately to Urine Culture Transport Tube.    Objective  Body mass index is 20.69 kg/m. Wt Readings from Last 3 Encounters:  12/12/21 128 lb 3.2 oz (58.2 kg)  10/06/21 128 lb (58.1 kg)  07/28/21 124 lb 12.8 oz (56.6 kg)   Temp Readings from Last 3 Encounters:  12/12/21 (!) 96.9  F (36.1 C) (Temporal)  10/06/21 (!) 97 F (36.1 C) (Temporal)  07/28/21 98.6 F (37 C) (Oral)   BP Readings from Last 3 Encounters:  12/12/21 136/74  10/06/21 136/82  07/28/21 104/68   Pulse Readings from Last  3 Encounters:  12/12/21 80  10/06/21 76  07/28/21 66    Physical Exam Vitals and nursing note reviewed.  Constitutional:      Appearance: Normal appearance. She is well-developed and well-groomed.  HENT:     Head: Normocephalic and atraumatic.  Eyes:     Conjunctiva/sclera: Conjunctivae normal.     Pupils: Pupils are equal, round, and reactive to light.  Cardiovascular:     Rate and Rhythm: Normal rate and regular rhythm.     Heart sounds: Normal heart sounds. No murmur heard. Pulmonary:     Effort: Pulmonary effort is normal.     Breath sounds: Normal breath sounds.  Abdominal:     General: Abdomen is flat. Bowel sounds are normal.     Tenderness: There is no abdominal tenderness.  Musculoskeletal:        General: No tenderness.  Skin:    General: Skin is warm and dry.  Neurological:     General: No focal deficit present.     Mental Status: She is alert and oriented to person, place, and time. Mental status is at baseline.     Cranial Nerves: Cranial nerves 2-12 are intact.     Gait: Gait is intact.  Psychiatric:        Attention and Perception: Attention and perception normal.        Mood and Affect: Mood and affect normal.        Speech: Speech normal.        Behavior: Behavior normal. Behavior is cooperative.        Thought Content: Thought content normal.        Cognition and Memory: Cognition and memory normal.        Judgment: Judgment normal.    Assessment  Plan  Syncope, unspecified syncope type - Plan: Ambulatory referral to [place], CANCELED: MR Brain Wo Contrast, CANCELED: US Carotid Duplex Bilateral Pt declines imaging for now neurology f/u and will call back   Lacunar infarction ([place]) - Plan: Ambulatory referral to [place],  CANCELED: MR Brain Wo Contrast, CANCELED: US Carotid Duplex Bilateral  Abnormal gait - Plan: Ambulatory referral to [place], CANCELED: MR Brain Wo Contrast  Dizziness - Plan: Ambulatory referral to [place], CANCELED: MR Brain Wo Contrast, CANCELED: US Carotid Duplex Bilateral  Tinnitus of both ears - Plan: Ambulatory referral to [place] Will call back if ENT desired   Memory loss - Plan: Ambulatory referral to [place], CANCELED: MR Brain Wo Contrast  Fall, subsequent encounter - Plan: Ambulatory referral to [place] Rx rollator today   Dementia without behavioral disturbance, psychotic disturbance, mood disturbance, or anxiety, unspecified dementia severity, unspecified dementia type ([place]) - Plan: Ambulatory referral to [place]  See above  HM Declines flu shot  Pfizer 2/2 disc booster  Reviewed 11/01/20 [facility] (vaccines prev declined vaccines (tdap, prevnar, flu), imaging I.e echo reviewed nl mild DD 11/2019, notes)    Out of age window colonoscopy (fobt neg x3 06/13/20), dexa, pap, mammogram    Provider: Dr. Olivia Mackie [facility]

## 2021-12-13 ENCOUNTER — Other Ambulatory Visit: Payer: Self-pay | Admitting: Podiatry

## 2021-12-13 MED ORDER — LIDOCAINE 5 % EX OINT: 1.0000 "application " | TOPICAL_OINTMENT | CUTANEOUS | 0 refills | Status: DC | PRN

## 2021-12-13 NOTE — Progress Notes (Signed)
Inform patient

## 2021-12-13 NOTE — Progress Notes (Signed)
Inform daughter

## 2021-12-22 ENCOUNTER — Telehealth: Payer: Self-pay | Admitting: Internal Medicine

## 2021-12-22 NOTE — Telephone Encounter (Signed)
Please advise on DX code

## 2021-12-22 NOTE — Telephone Encounter (Signed)
Carmen Humphrey called in stating that a referral was sent to there office for the Pt. Carmen stated that there is a coding issue with one of the diagnosis code (R55 ICD-10-CM Syncope, unspecified syncope type). Carmen stated that there office could use that code because its not specified. Carmen stated what is causing the Syncope so they can attach the reason to the code. Carmen stated that Dr. Olivia Mackie can use dementia as the primary diagnosis if she wants to. Carmen stated they just need a primary code for home health care. Creola Corn is requesting callback at [phone]

## 2021-12-25 NOTE — Telephone Encounter (Signed)
Carmen Humphrey called from [employer] [facility] stating she wants to know the cause for syncope and what stage is the leg ulcer in

## 2021-12-25 NOTE — Telephone Encounter (Signed)
Dementia is listed in there and can be primary dx as well as falls  Please inform [facility]  Thank you  All diagnoses were listed in my note and with the referral

## 2021-12-26 NOTE — Telephone Encounter (Signed)
There is no current leg ulcer  Syncope happened in 2022 summer due to dehydration

## 2021-12-27 NOTE — Telephone Encounter (Signed)
Carmen Humphrey is aware of below. Primary dx is dementia, frequent falls.

## 2021-12-28 ENCOUNTER — Ambulatory Visit: Payer: Medicare Other | Admitting: Podiatry

## 2022-01-30 ENCOUNTER — Ambulatory Visit (INDEPENDENT_AMBULATORY_CARE_PROVIDER_SITE_OTHER): Payer: Medicare Other | Admitting: Podiatry

## 2022-01-30 ENCOUNTER — Encounter: Payer: Self-pay | Admitting: Podiatry

## 2022-01-30 ENCOUNTER — Other Ambulatory Visit: Payer: Self-pay

## 2022-01-30 DIAGNOSIS — B351 Tinea unguium: Secondary | ICD-10-CM | POA: Diagnosis not present

## 2022-01-30 DIAGNOSIS — D689 Coagulation defect, unspecified: Secondary | ICD-10-CM | POA: Diagnosis not present

## 2022-01-30 DIAGNOSIS — M79676 Pain in unspecified toe(s): Secondary | ICD-10-CM

## 2022-01-30 NOTE — Progress Notes (Signed)
?Subjective:  ?Patient ID: Carmen Humphrey, female    DOB: 04/20/1925,  MRN: [id number] ? ?Chief Complaint  ?Patient presents with  ? Nail Problem  ?  "The hammer toes, they hurt all the time.  The toenails grow in the skin, the right one is the worst.  They hurt all the time."  ? ? ?88 y.o. female returns for the above complaint.  Patient presents with thickened elongated dystrophic toenails x10.  Patient would like to have them debrided down.  She states is painful to touch.  She states she is not able to take care of it herself.  Patient is on a blood thinner.  She denies any secondary complaint today.  She would like a refill on her lidocaine ointment ? ?Objective:  ? ?There were no vitals filed for this visit. ?Podiatric Exam: ?Vascular: dorsalis pedis and posterior tibial pulses are palpable bilateral. Capillary return is immediate. Temperature gradient is WNL. Skin turgor WNL  ?Sensorium: Sporadic numbness tingling noted.  Normal sinus monofilament testing noted.  Normal normal tactile sensation noted. ?Nail Exam: Pt has thick disfigured discolored nails with subungual debris noted bilateral entire nail hallux through fifth toenails ?Ulcer Exam: There is no evidence of ulcer or pre-ulcerative changes or infection. ?Orthopedic Exam: Muscle tone and strength are WNL. No limitations in general ROM. No crepitus or effusions noted. HAV  B/L.  Hammer toes 2-5  B/L. ?Skin: No Porokeratosis. No infection or ulcers.  Mild ingrown noted of the right hallux without any paronychia or clinical signs of infection.  Heloma molle noted to bilateral first and second digit facing sides.  Mild pain on palpation ? ?Assessment & Plan:  ?Patient was evaluated and treated and all questions answered. ? ?Numbness tingling to both of her feet ?-X-ray of the patient etiology of numbness tingling various treatment options were extensively discussed.  I believe patient will benefit from lidocaine patch to help with the numbness tingling.   She is already has taken gabapentin and it does not help. ?-Lidocaine ointment was sent to the pharmacy again.  ? ?Bilateral severe bunion deformity with hammertoe contractures of 2 through 5 ?-The spacer and offloading with shoe gear modification has helped tremendously.  Patient still has some pain but much more manageable.  Patient just seems to continue to have the same discussion without any acute management.  She also has been noncompliant with conservative treatment options including applying toe pressure is appropriately. ? ?Right hallux mild ingrown ?-I explained to the patient the etiology of ingrown and likely the hallux valgus rotation of the big toe into the second toe is still primary cause of the pain associated with this.  Unfortunately I will hold off on removal given patient's age and the pain associated with this has to do with the biomechanical deformity of the toe.  I believe she will benefit from a toe spacer.  Toe spacer was dispensed. ? ?Bilateral first and second facing surface heloma molle ?-I explained to the patient the etiology, heloma molle and various treatment options were discussed.  I have encouraged her to continue using toe protectors and spacers at this time.  This is very mild in nature.  Patient states understanding ? ?Onychomycosis with pain  ?-Nails palliatively debrided as below. ?-Educated on self-care ? ?Procedure: Nail Debridement ?Rationale: pain  ?Type of Debridement: manual, sharp debridement. ?Instrumentation: Nail nipper, rotary burr. ?Number of Nails: 10 ? ?Procedures and Treatment: Consent by patient was obtained for treatment procedures. The patient understood the discussion  of treatment and procedures well. All questions were answered thoroughly reviewed. Debridement of mycotic and hypertrophic toenails, 1 through 5 bilateral and clearing of subungual debris. No ulceration, no infection noted.  ?Return Visit-Office Procedure: Patient instructed to return to the  office for a follow up visit 3 months for continued evaluation and treatment. ? ?Boneta Lucks, DPM ?  ? ?No follow-ups on file. ? ? ? ? ? ?Boneta Lucks, DPM ?  ? ?No follow-ups on file. ? ?

## 2022-02-13 ENCOUNTER — Encounter: Payer: Self-pay | Admitting: Internal Medicine

## 2022-02-13 ENCOUNTER — Other Ambulatory Visit: Payer: Self-pay

## 2022-02-13 ENCOUNTER — Ambulatory Visit (INDEPENDENT_AMBULATORY_CARE_PROVIDER_SITE_OTHER): Payer: Medicare Other | Admitting: Internal Medicine

## 2022-02-13 VITALS — BP 122/70 | HR 76 | Temp 98.3°F | Ht 66.0 in | Wt 129.6 lb

## 2022-02-13 DIAGNOSIS — M47816 Spondylosis without myelopathy or radiculopathy, lumbar region: Secondary | ICD-10-CM | POA: Diagnosis not present

## 2022-02-13 DIAGNOSIS — M1610 Unilateral primary osteoarthritis, unspecified hip: Secondary | ICD-10-CM | POA: Diagnosis not present

## 2022-02-13 HISTORY — DX: Spondylosis without myelopathy or radiculopathy, lumbar region: M47.816

## 2022-02-13 NOTE — Patient Instructions (Addendum)
Aspercream with lidocaine  ?Lidocaine or salonpas pain patch over the counter  ?Tylenol arthritis  ?Heat/ice  ? ?Call me back if you want medication stronger for pain  ? ?Consider injections ? ?Sciatica Rehab ?Ask your health care provider which exercises are safe for you. Do exercises exactly as told by your health care provider and adjust them as directed. It is normal to feel mild stretching, pulling, tightness, or discomfort as you do these exercises. Stop right away if you feel sudden pain or your pain gets worse. Do not begin these exercises until told by your health care provider. ?Stretching and range-of-motion exercises ?These exercises warm up your muscles and joints and improve the movement and flexibility of your hips and back. These exercises also help to relieve pain, numbness, and tingling. ?Sciatic nerve glide ?Sit in a chair with your head facing down toward your chest. Place your hands behind your back. Let your shoulders slump forward. ?Slowly straighten one of your legs while you tilt your head back as if you are looking toward the ceiling. Only straighten your leg as far as you can without making your symptoms worse. ?Hold this position for __________ seconds. ?Slowly return to the starting position. ?Repeat with your other leg. ?Repeat __________ times. Complete this exercise __________ times a day. ?Knee to chest with hip adduction and internal rotation ? ?Lie on your back on a firm surface with both legs straight. ?Bend one of your knees and move it up toward your chest until you feel a gentle stretch in your lower back and buttock. Then, move your knee toward the shoulder that is on the opposite side from your leg. This is hip adduction and internal rotation. ?Hold your leg in this position by holding on to the front of your knee. ?Hold this position for __________ seconds. ?Slowly return to the starting position. ?Repeat with your other leg. ?Repeat __________ times. Complete this exercise  __________ times a day. ?Prone extension on elbows ? ?Lie on your abdomen on a firm surface. A bed may be too soft for this exercise. ?Prop yourself up on your elbows. ?Use your arms to help lift your chest up until you feel a gentle stretch in your abdomen and your lower back. ?This will place some of your body weight on your elbows. If this is uncomfortable, try stacking pillows under your chest. ?Your hips should stay down, against the surface that you are lying on. Keep your hip and back muscles relaxed. ?Hold this position for __________ seconds. ?Slowly relax your upper body and return to the starting position. ?Repeat __________ times. Complete this exercise __________ times a day. ?Strengthening exercises ?These exercises build strength and endurance in your back. Endurance is the ability to use your muscles for a long time, even after they get tired. ?Pelvic tilt ?This exercise strengthens the muscles that lie deep in the abdomen. ?Lie on your back on a firm surface. Bend your knees and keep your feet flat on the floor. ?Tense your abdominal muscles. Tip your pelvis up toward the ceiling and flatten your lower back into the floor. ?To help with this exercise, you may place a small towel under your lower back and try to push your back into the towel. ?Hold this position for __________ seconds. ?Let your muscles relax completely before you repeat this exercise. ?Repeat __________ times. Complete this exercise __________ times a day. ?Alternating arm and leg raises ? ?Get on your hands and knees on a firm surface. If you  are on a hard floor, you may want to use padding, such as an exercise mat, to cushion your knees. ?Line up your arms and legs. Your hands should be directly below your shoulders, and your knees should be directly below your hips. ?Lift your left leg behind you. At the same time, raise your right arm and straighten it in front of you. ?Do not lift your leg higher than your hip. ?Do not lift  your arm higher than your shoulder. ?Keep your abdominal and back muscles tight. ?Keep your hips facing the ground. ?Do not arch your back. ?Keep your balance carefully, and do not hold your breath. ?Hold this position for __________ seconds. ?Slowly return to the starting position. ?Repeat with your right leg and your left arm. ?Repeat __________ times. Complete this exercise __________ times a day. ?Posture and body mechanics ?Good posture and healthy body mechanics can help to relieve stress in your body's tissues and joints. Body mechanics refers to the movements and positions of your body while you do your daily activities. Posture is part of body mechanics. Good posture means: ?Your spine is in its natural S-curve position (neutral). ?Your shoulders are pulled back slightly. ?Your head is not tipped forward. ?Follow these guidelines to improve your posture and body mechanics in your everyday activities. ?Standing ? ?When standing, keep your spine neutral and your feet about hip width apart. Keep a slight bend in your knees. Your ears, shoulders, and hips should line up. ?When you do a task in which you stand in one place for a long time, place one foot up on a stable object that is 2-4 inches (5-10 cm) high, such as a footstool. This helps keep your spine neutral. ?Sitting ? ?When sitting, keep your spine neutral and keep your feet flat on the floor. Use a footrest, if necessary, and keep your thighs parallel to the floor. Avoid rounding your shoulders, and avoid tilting your head forward. ?When working at a desk or a computer, keep your desk at a height where your hands are slightly lower than your elbows. Slide your chair under your desk so you are close enough to maintain good posture. ?When working at a computer, place your monitor at a height where you are looking straight ahead and you do not have to tilt your head forward or downward to look at the screen. ?Resting ?When lying down and resting, avoid  positions that are most painful for you. ?If you have pain with activities such as sitting, bending, stooping, or squatting, lie in a position in which your body does not bend very much. For example, avoid curling up on your side with your arms and knees near your chest (fetal position). ?If you have pain with activities such as standing for a long time or reaching with your arms, lie with your spine in a neutral position and bend your knees slightly. Try the following positions: ?Lying on your side with a pillow between your knees. ?Lying on your back with a pillow under your knees. ?Lifting ? ?When lifting objects, keep your feet at least shoulder width apart and tighten your abdominal muscles. ?Bend your knees and hips and keep your spine neutral. It is important to lift using the strength of your legs, not your back. Do not lock your knees straight out. ?Always ask for help to lift heavy or awkward objects. ?This information is not intended to replace advice given to you by your health care provider. Make sure you discuss any  questions you have with your health care provider. ?Document Revised: 03/06/2019 Document Reviewed: 12/04/2018 ?Elsevier Patient Education ? [place]. ? ?Low Back Sprain or Strain Rehab ?Ask your health care provider which exercises are safe for you. Do exercises exactly as told by your health care provider and adjust them as directed. It is normal to feel mild stretching, pulling, tightness, or discomfort as you do these exercises. Stop right away if you feel sudden pain or your pain gets worse. Do not begin these exercises until told by your health care provider. ?Stretching and range-of-motion exercises ?These exercises warm up your muscles and joints and improve the movement and flexibility of your back. These exercises also help to relieve pain, numbness, and tingling. ?Lumbar rotation ? ?Lie on your back on a firm bed or the floor with your knees bent. ?Straighten your arms  out to your sides so each arm forms a 90-degree angle (right angle) with a side of your body. ?Slowly move (rotate) both of your knees to one side of your body until you feel a stretch in your lower back (l

## 2022-02-13 NOTE — Progress Notes (Signed)
Chief Complaint  ?Patient presents with  ? Back Pain  ? Rectal Pain  ? ?F/u with daughter brenda  ?C/o low back pain and pain in buttocks x < 1 week no falls or injuries pain rad from back to lower buttocks 10/10 tried otc tylenol prior imaging +arthritis low back and hip arthritis  ? ? ?Review of Systems  ?Constitutional:  Negative for weight loss.  ?HENT:  Negative for hearing loss.   ?Eyes:  Negative for blurred vision.  ?Respiratory:  Negative for shortness of breath.   ?Cardiovascular:  Negative for chest pain.  ?Gastrointestinal:  Negative for abdominal pain and blood in stool.  ?Genitourinary:  Negative for dysuria.  ?Musculoskeletal:  Positive for back pain and joint pain. Negative for falls.  ?Skin:  Negative for rash.  ?Neurological:  Negative for headaches.  ?Psychiatric/Behavioral:  Negative for depression.   ?Past Medical History:  ?Diagnosis Date  ? Abdominal pain   ? ANA positive   ? 1:320 08/09/16   ? Aortic atherosclerosis ([place])   ? Arthritis   ? modreate right hips, C DD severe C5/6, b/l knees patellar enthesophytosis b/l   ? Bowel habit changes   ? pressure  ? Cancer [facility])   ? Carotid stenosis   ? b/l mild 06/06/16  ? Chronic constipation   ? Colon polyps   ? Colon polyps   ? x2 removal  ? Depression   ? Fibromyalgia   ? Heart disease 2012  ? Hemorrhoids   ? History of fainting spells of unknown cause   ? HLD (hyperlipidemia)   ? Inflammatory polyps of colon ([place])   ? Myoclonic jerking   ? Poor appetite   ? Psoriasis   ? Right leg DVT ([place])   ? fall 09/2020 dx'ed [facility]  ? Shortness of breath dyspnea   ? Syncope   ? summer 2022  ? Tinnitus   ? Urinary urgency   ? UTI (urinary tract infection)   ? summer 2022  ? Vaginal polyp   ? Vitamin D deficiency   ? Vitamin D deficiency   ? ?Past Surgical History:  ?Procedure Laterality Date  ? BREAST SURGERY    ? left breast   ? ECTOPIC PREGNANCY SURGERY    ? ENDOMETRIAL BIOPSY    ? 07/23/16 neg bx  ? fallopian tube removal    ? HYSTEROSCOPY  WITH D & C N/A 07/23/2016  ? Procedure: DILATATION AND CURETTAGE;  Surgeon: Benjaman Kindler, MD;  Location: [facility] ORS;  Service: Gynecology;  Laterality: N/A;  ? right foot surgery     ? TONSILLECTOMY    ? TUBAL LIGATION    ? ?Family History  ?Problem Relation Age of Onset  ? Heart disease Mother   ? Hypertension Mother   ? Arthritis Mother   ? Heart disease Father   ? Hypertension Father   ? ?Social History  ? ?Socioeconomic History  ? Marital status: Widowed  ?  Spouse name: Not on file  ? Number of children: Not on file  ? Years of education: Not on file  ? Highest education level: Not on file  ?Occupational History  ? Not on file  ?Tobacco Use  ? Smoking status: Never  ? Smokeless tobacco: Never  ?Substance and Sexual Activity  ? Alcohol use: No  ?  Alcohol/week: 0.0 standard drinks  ? Drug use: No  ? Sexual activity: Not Currently  ?Other Topics Concern  ? Not on file  ?Social History Narrative  ?  Lives with daughter [name]   ? Has 2 daughters   ? From [place]  ? 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 ?Social Determinants of Health  ? ?Financial Resource Strain: Not on file  ?Food Insecurity: Not on file  ?Transportation Needs: Not on file  ?Physical Activity: Not on file  ?Stress: Not on file  ?Social Connections: Not on file  ?Intimate Partner Violence: Not on file  ? ?Current Meds  ?Medication Sig  ? Cholecalciferol (VITAMIN D3) 1000 units CAPS Take 1 capsule by mouth daily.  ? Coenzyme Q10-Fish Oil-Vit E (CO-Q 10 OMEGA-3 FISH OIL) CAPS Take 2 capsules by mouth daily.  ? NON FORMULARY [place] Blue Stuff  ? Polyethyl Glycol-Propyl Glycol (SYSTANE OP) Apply to eye.  ? psyllium (METAMUCIL) 58.6 % powder Take 1 packet by mouth daily as needed.  ? ?Allergies  ?Allergen Reactions  ? Ciprofloxacin Nausea And Vomiting  ? Eliquis [Apixaban] Swelling  ?  Tongue swelling and numbness   ? Fish Allergy Other (See Comments)  ?  [place]  ? Fish-Derived Products Other (See Comments)  ?  [place]   ? Shellfish-Derived Products   ? ?No results found for this or any previous visit (from the past 2160 hour(s)). ?Objective  ?Body mass index is 20.92 kg/m?. ?Wt Readings from Last 3 Encounters:  ?02/13/22 129 lb 9.6 oz (58.8 kg)  ?12/12/21 128 lb 3.2 oz (58.2 kg)  ?10/06/21 128 lb (58.1 kg)  ? ?Temp Readings from Last 3 Encounters:  ?02/13/22 98.3 ?F (36.8 ?C) (Oral)  ?12/12/21 (!) 96.9 ?F (36.1 ?C) (Temporal)  ?10/06/21 (!) 97 ?F (36.1 ?C) (Temporal)  ? ?BP Readings from Last 3 Encounters:  ?02/13/22 122/70  ?12/12/21 136/74  ?10/06/21 136/82  ? ?Pulse Readings from Last 3 Encounters:  ?02/13/22 76  ?12/12/21 80  ?10/06/21 76  ? ? ?Physical Exam ?Vitals and nursing note reviewed.  ?Constitutional:   ?   Appearance: Normal appearance. She is well-developed and well-groomed.  ?HENT:  ?   Head: Normocephalic and atraumatic.  ?Eyes:  ?   Conjunctiva/sclera: Conjunctivae normal.  ?   Pupils: Pupils are equal, round, and reactive to light.  ?Cardiovascular:  ?   Rate and Rhythm: Normal rate and regular rhythm.  ?   Heart sounds: Normal heart sounds. No murmur heard. ?Pulmonary:  ?   Effort: Pulmonary effort is normal.  ?   Breath sounds: Normal breath sounds.  ?Abdominal:  ?   General: Abdomen is flat. Bowel sounds are normal.  ?   Tenderness: There is no abdominal tenderness.  ?Musculoskeletal:     ?   General: No tenderness.  ?Skin: ?   General: Skin is warm and dry.  ?Neurological:  ?   General: No focal deficit present.  ?   Mental Status: She is alert and oriented to person, place, and time. Mental status is at baseline.  ?   Cranial Nerves: Cranial nerves 2-12 are intact.  ?   Motor: Motor function is intact.  ?   Coordination: Coordination is intact.  ?   Gait: Gait is intact.  ?Psychiatric:     ?   Attention and Perception: Attention and perception normal.     ?   Mood and Affect: Mood and affect normal.     ?   Speech: Speech normal.     ?   Behavior: Behavior normal. Behavior is cooperative.     ?   Thought  Content: Thought content normal.     ?   Cognition and Memory: Cognition and memory normal.     ?   Judgment: Judgment normal.  ? ? ?Assessment  ?Plan  ?Arthritis of lumbar spine - Plan: Ambulatory referral to Home Health ? ?Hip arthritis - Plan: Ambulatory referral to Home Health ?Aspercream with lidocaine  ?Lidocaine or salonpas pain patch over the counter  ?Tylenol arthritis  ?Heat/ice  ? ?Call me back if you want medication stronger for pain  ? ?Consider injections ? ? ?HM ?Declines flu shot  ?Pfizer 2/2 disc booster  ?Reviewed 11/01/20 [facility] (vaccines prev declined vaccines (tdap, prevnar, flu), imaging I.e echo reviewed nl mild DD 11/2019, notes)  ?  ?Out of age window colonoscopy (fobt neg x3 06/13/20), dexa, pap, mammogram  ?  ?  ?Provider: Dr. Olivia Mackie McLean-Scocuzza-Internal Medicine  ?

## 2022-03-06 ENCOUNTER — Ambulatory Visit (INDEPENDENT_AMBULATORY_CARE_PROVIDER_SITE_OTHER): Payer: Medicare Other | Admitting: Podiatry

## 2022-03-06 DIAGNOSIS — L600 Ingrowing nail: Secondary | ICD-10-CM | POA: Diagnosis not present

## 2022-03-06 NOTE — Progress Notes (Signed)
?Subjective:  ?Patient ID: Carmen Humphrey, female    DOB: 26-Dec-1924,  MRN: [id number] ? ?Chief Complaint  ?Patient presents with  ? Nail Problem  ? ? ?88 y.o. female presents with the above complaint.  Patient presents with complaint of right second ingrown nail border to the medial side but the entire nail hurting.  Patient states painful to touch is progressive gotten worse.  She is on blood thinner.  She would like to have it removed.  She has not seen anyone as prior to seeing me.  She denies any other acute complaints. ? ? ?Review of Systems: Negative except as noted in the HPI. Denies N/V/F/Ch. ? ?Past Medical History:  ?Diagnosis Date  ? Abdominal pain   ? ANA positive   ? 1:320 08/09/16   ? Aortic atherosclerosis ([place])   ? Arthritis   ? modreate right hips, C DD severe C5/6, b/l knees patellar enthesophytosis b/l   ? Bowel habit changes   ? pressure  ? Cancer [facility])   ? Carotid stenosis   ? b/l mild 06/06/16  ? Chronic constipation   ? Colon polyps   ? Colon polyps   ? x2 removal  ? Depression   ? Fibromyalgia   ? Heart disease 2012  ? Hemorrhoids   ? History of fainting spells of unknown cause   ? HLD (hyperlipidemia)   ? Inflammatory polyps of colon ([place])   ? Myoclonic jerking   ? Poor appetite   ? Psoriasis   ? Right leg DVT ([place])   ? fall 09/2020 dx[facility]ed [facility]  ? Shortness of breath dyspnea   ? Syncope   ? summer 2022  ? Tinnitus   ? Urinary urgency   ? UTI (urinary tract infection)   ? summer 2022  ? Vaginal polyp   ? Vitamin D deficiency   ? Vitamin D deficiency   ? ? ?Current Outpatient Medications:  ?  Cholecalciferol (VITAMIN D3) 1000 units CAPS, Take 1 capsule by mouth daily., Disp: , Rfl:  ?  Coenzyme Q10-Fish Oil-Vit E (CO-Q 10 OMEGA-3 FISH OIL) CAPS, Take 2 capsules by mouth daily., Disp: , Rfl:  ?  lidocaine (XYLOCAINE) 5 % ointment, Apply 1 application topically as needed. (Patient not taking: Reported on 02/13/2022), Disp: 35.44 g, Rfl: 0 ?  NON FORMULARY, [place] Blue  Stuff, Disp: , Rfl:  ?  Polyethyl Glycol-Propyl Glycol (SYSTANE OP), Apply to eye., Disp: , Rfl:  ?  psyllium (METAMUCIL) 58.6 % powder, Take 1 packet by mouth daily as needed., Disp: , Rfl:  ? ?Social History  ? ?Tobacco Use  ?Smoking Status Never  ?Smokeless Tobacco Never  ? ? ?Allergies  ?Allergen Reactions  ? Ciprofloxacin Nausea And Vomiting  ? Eliquis [Apixaban] Swelling  ?  Tongue swelling and numbness   ? Fish Allergy Other (See Comments)  ?  [place]  ? Fish-Derived Products Other (See Comments)  ?  [place]  ? Shellfish-Derived Products   ? ?Objective:  ?There were no vitals filed for this visit. ?There is no height or weight on file to calculate BMI. ?Constitutional Well developed. ?Well nourished.  ?Vascular Dorsalis pedis pulses palpable bilaterally. ?Posterior tibial pulses palpable bilaterally. ?Capillary refill normal to all digits.  ?No cyanosis or clubbing noted. ?Pedal hair growth normal.  ?Neurologic Normal speech. ?Oriented to person, place, and time. ?Epicritic sensation to light touch grossly present bilaterally.  ?Dermatologic Pain on palpation of the entire/total nail on 2nd digit of the right  ingrown noted as well to the medial border ?No other open wounds. ?No skin lesions.  ?Orthopedic: Normal joint ROM without pain or crepitus bilaterally. ?No visible deformities. ?No bony tenderness.  ? ?Radiographs: None ?Assessment:  ?No diagnosis found. ?Plan:  ?Patient was evaluated and treated and all questions answered. ? ?Nail contusion/dystrophy with underlying ingrown right second, right ?-Patient elects to proceed with minor surgery to remove entire toenail today. Consent reviewed and signed by patient. ?-Entire/total nail excised. See procedure note. ?-Educated on post-procedure care including soaking. Written instructions provided and reviewed. ?-Patient to follow up in 2 weeks for nail check. ? ?Procedure: Excision of entire/total nail with phenol matricectomy ?Location: Right 2nd toe  digit ?Anesthesia: Lidocaine 1% plain; 1.5 mL and Marcaine 0.5% plain; 1.5 mL, digital block. ?Skin Prep: Betadine. ?Dressing: Silvadene; telfa; dry, sterile, compression dressing. ?Technique: Following skin prep, the toe was exsanguinated and a tourniquet was secured at the base of the toe. The affected nail border was freed and excised.  Phenol matricectomy was performed in standard technique the tourniquet was then removed and sterile dressing applied. ?Disposition: Patient tolerated procedure well. Patient to return in 2 weeks for follow-up.  ? ?No follow-ups on file. ? ?

## 2022-03-13 ENCOUNTER — Ambulatory Visit (INDEPENDENT_AMBULATORY_CARE_PROVIDER_SITE_OTHER): Payer: Medicare Other | Admitting: Podiatry

## 2022-03-13 DIAGNOSIS — L97511 Non-pressure chronic ulcer of other part of right foot limited to breakdown of skin: Secondary | ICD-10-CM | POA: Diagnosis not present

## 2022-03-13 NOTE — Progress Notes (Signed)
?Subjective:  ?Patient ID: Carmen Humphrey, female    DOB: 02/27/1925,  MRN: [id number] ? ?Chief Complaint  ?Patient presents with  ? Nail Problem  ? ? ?88 y.o. female presents with the above complaint.  Patient presents with complaint of right second digit pain.  Patient states is painful to touch has progressive gotten worse.  She had it removed the nail.  She has been doing Epsom salt soaks but has not healed yet.  She is a slow healer given her age.  There is superficial ulceration formed.  She denies any other acute complaints no redness noted. ? ? ?Review of Systems: Negative except as noted in the HPI. Denies N/V/F/Ch. ? ?Past Medical History:  ?Diagnosis Date  ? Abdominal pain   ? ANA positive   ? 1:320 08/09/16   ? Aortic atherosclerosis ([place])   ? Arthritis   ? modreate right hips, C DD severe C5/6, b/l knees patellar enthesophytosis b/l   ? Bowel habit changes   ? pressure  ? Cancer [facility])   ? Carotid stenosis   ? b/l mild 06/06/16  ? Chronic constipation   ? Colon polyps   ? Colon polyps   ? x2 removal  ? Depression   ? Fibromyalgia   ? Heart disease 2012  ? Hemorrhoids   ? History of fainting spells of unknown cause   ? HLD (hyperlipidemia)   ? Inflammatory polyps of colon ([place])   ? Myoclonic jerking   ? Poor appetite   ? Psoriasis   ? Right leg DVT ([place])   ? fall 09/2020 dx[facility]ed [facility]  ? Shortness of breath dyspnea   ? Syncope   ? summer 2022  ? Tinnitus   ? Urinary urgency   ? UTI (urinary tract infection)   ? summer 2022  ? Vaginal polyp   ? Vitamin D deficiency   ? Vitamin D deficiency   ? ? ?Current Outpatient Medications:  ?  Cholecalciferol (VITAMIN D3) 1000 units CAPS, Take 1 capsule by mouth daily., Disp: , Rfl:  ?  Coenzyme Q10-Fish Oil-Vit E (CO-Q 10 OMEGA-3 FISH OIL) CAPS, Take 2 capsules by mouth daily., Disp: , Rfl:  ?  lidocaine (XYLOCAINE) 5 % ointment, Apply 1 application topically as needed. (Patient not taking: Reported on 02/13/2022), Disp: 35.44 g, Rfl: 0 ?  NON  FORMULARY, [place] Blue Stuff, Disp: , Rfl:  ?  Polyethyl Glycol-Propyl Glycol (SYSTANE OP), Apply to eye., Disp: , Rfl:  ?  psyllium (METAMUCIL) 58.6 % powder, Take 1 packet by mouth daily as needed., Disp: , Rfl:  ? ?Social History  ? ?Tobacco Use  ?Smoking Status Never  ?Smokeless Tobacco Never  ? ? ?Allergies  ?Allergen Reactions  ? Ciprofloxacin Nausea And Vomiting  ? Eliquis [Apixaban] Swelling  ?  Tongue swelling and numbness   ? Fish Allergy Other (See Comments)  ?  [place]  ? Fish-Derived Products Other (See Comments)  ?  [place]  ? Shellfish-Derived Products   ? ?Objective:  ?There were no vitals filed for this visit. ?There is no height or weight on file to calculate BMI. ?Constitutional Well developed. ?Well nourished.  ?Vascular Dorsalis pedis pulses palpable bilaterally. ?Posterior tibial pulses palpable bilaterally. ?Capillary refill normal to all digits.  ?No cyanosis or clubbing noted. ?Pedal hair growth normal.  ?Neurologic Normal speech. ?Oriented to person, place, and time. ?Epicritic sensation to light touch grossly present bilaterally.  ?Dermatologic Hypergranular skin bed/wound bed noted to the right second  digit.  No redness noted.  Pain on palpation.  Notable other abnormalities noted  ?Orthopedic: Normal joint ROM without pain or crepitus bilaterally. ?No visible deformities. ?No bony tenderness.  ? ?Radiographs: None ?Assessment:  ? ?1. Ulcer of right second toe, limited to breakdown of skin ([place])   ? ?Plan:  ?Patient was evaluated and treated and all questions answered. ? ?Right second digit ulceration limited to the breakdown of skin secondary to history of total nail avulsion ?-All questions and concerns were discussed with the patient in extensive detail ?-I urged her to do Betadine wet-to-dry dressing change and continue wearing surgical shoe.  It is not clinically infected therefore she does not need any antibiotics at this time. ? ?No follow-ups on file. ?

## 2022-04-02 ENCOUNTER — Telehealth: Payer: Self-pay

## 2022-04-02 NOTE — Telephone Encounter (Signed)
Patient's daughter, [name], called to say patient had pain in her back and buttocks through the night.  [name] states patient refused to go to the Urgent Care.  [name] would like for patient to be see.  Patient's doctor is not in today, scheduled appointment for patient to see her tomorrow. ?

## 2022-04-03 ENCOUNTER — Ambulatory Visit (INDEPENDENT_AMBULATORY_CARE_PROVIDER_SITE_OTHER): Payer: Medicare Other | Admitting: Internal Medicine

## 2022-04-03 ENCOUNTER — Ambulatory Visit (INDEPENDENT_AMBULATORY_CARE_PROVIDER_SITE_OTHER): Payer: Medicare Other | Admitting: Podiatry

## 2022-04-03 ENCOUNTER — Encounter: Payer: Self-pay | Admitting: Internal Medicine

## 2022-04-03 VITALS — BP 120/80 | HR 72 | Temp 97.5°F | Resp 14 | Ht 66.0 in | Wt 126.2 lb

## 2022-04-03 DIAGNOSIS — M47816 Spondylosis without myelopathy or radiculopathy, lumbar region: Secondary | ICD-10-CM | POA: Insufficient documentation

## 2022-04-03 DIAGNOSIS — M5030 Other cervical disc degeneration, unspecified cervical region: Secondary | ICD-10-CM

## 2022-04-03 DIAGNOSIS — M1611 Unilateral primary osteoarthritis, right hip: Secondary | ICD-10-CM | POA: Diagnosis not present

## 2022-04-03 DIAGNOSIS — L97511 Non-pressure chronic ulcer of other part of right foot limited to breakdown of skin: Secondary | ICD-10-CM | POA: Diagnosis not present

## 2022-04-03 HISTORY — DX: Unilateral primary osteoarthritis, right hip: M16.11

## 2022-04-03 HISTORY — DX: Spondylosis without myelopathy or radiculopathy, lumbar region: M47.816

## 2022-04-03 NOTE — Progress Notes (Signed)
?Subjective:  ?Patient ID: Carmen Humphrey, female    DOB: 31-Aug-1925,  MRN: [id number] ? ?Chief Complaint  ?Patient presents with  ? Foot Ulcer  ? ? ?88 y.o. female presents with the above complaint.  Patient presents with complaint of right second digit pain.  She states she is doing a lot better the Betadine helped.  Her pain is improved.  She still has a little limited breakdown of the skin.  She has been doing Betadine wet-to-dry dressing. ? ? ?Review of Systems: Negative except as noted in the HPI. Denies N/V/F/Ch. ? ?Past Medical History:  ?Diagnosis Date  ? Abdominal pain   ? ANA positive   ? 1:320 08/09/16   ? Aortic atherosclerosis ([place])   ? Arthritis   ? modreate right hips, C DD severe C5/6, b/l knees patellar enthesophytosis b/l   ? Bowel habit changes   ? pressure  ? Cancer [facility])   ? Carotid stenosis   ? b/l mild 06/06/16  ? Chronic constipation   ? Colon polyps   ? Colon polyps   ? x2 removal  ? Depression   ? Fibromyalgia   ? Heart disease 2012  ? Hemorrhoids   ? History of fainting spells of unknown cause   ? HLD (hyperlipidemia)   ? Inflammatory polyps of colon ([place])   ? Myoclonic jerking   ? Poor appetite   ? Psoriasis   ? Right leg DVT ([place])   ? fall 09/2020 dx'ed [facility]  ? Shortness of breath dyspnea   ? Syncope   ? summer 2022  ? Tinnitus   ? Urinary urgency   ? UTI (urinary tract infection)   ? summer 2022  ? Vaginal polyp   ? Vitamin D deficiency   ? Vitamin D deficiency   ? ? ?Current Outpatient Medications:  ?  Cholecalciferol (VITAMIN D3) 1000 units CAPS, Take 1 capsule by mouth daily., Disp: , Rfl:  ?  Coenzyme Q10-Fish Oil-Vit E (CO-Q 10 OMEGA-3 FISH OIL) CAPS, Take 2 capsules by mouth daily., Disp: , Rfl:  ?  lidocaine (XYLOCAINE) 5 % ointment, Apply 1 application topically as needed. (Patient not taking: Reported on 02/13/2022), Disp: 35.44 g, Rfl: 0 ?  NON FORMULARY, [place] Blue Stuff, Disp: , Rfl:  ?  Polyethyl Glycol-Propyl Glycol (SYSTANE OP), Apply to eye.,  Disp: , Rfl:  ?  psyllium (METAMUCIL) 58.6 % powder, Take 1 packet by mouth daily as needed., Disp: , Rfl:  ? ?Social History  ? ?Tobacco Use  ?Smoking Status Never  ?Smokeless Tobacco Never  ? ? ?Allergies  ?Allergen Reactions  ? Ciprofloxacin Nausea And Vomiting  ? Eliquis [Apixaban] Swelling  ?  Tongue swelling and numbness   ? Fish Allergy Other (See Comments)  ?  [place]  ? Fish-Derived Products Other (See Comments)  ?  [place]  ? Shellfish-Derived Products   ? ?Objective:  ?There were no vitals filed for this visit. ?There is no height or weight on file to calculate BMI. ?Constitutional Well developed. ?Well nourished.  ?Vascular Dorsalis pedis pulses palpable bilaterally. ?Posterior tibial pulses palpable bilaterally. ?Capillary refill normal to all digits.  ?No cyanosis or clubbing noted. ?Pedal hair growth normal.  ?Neurologic Normal speech. ?Oriented to person, place, and time. ?Epicritic sensation to light touch grossly present bilaterally.  ?Dermatologic No further hypergranular skin bed/wound bed noted to the right second digit.  No redness noted.  Very mild pain on palpation.  Notable other abnormalities noted  ?Orthopedic:  Normal joint ROM without pain or crepitus bilaterally. ?No visible deformities. ?No bony tenderness.  ? ?Radiographs: None ?Assessment:  ? ?No diagnosis found. ? ?Plan:  ?Patient was evaluated and treated and all questions answered. ? ?Right second digit ulceration limited to the breakdown of skin secondary to history of total nail avulsion ?-Clinically doing well and mostly reepithelialized.  Small tiny area that is still open.  I encouraged her to continue Betadine dressing and should be epithelialized over 1 or 2 weeks.  She states understanding.  Her pain has improved considerably.  She denies any other acute complaints ? ?No follow-ups on file. ?

## 2022-04-03 NOTE — Patient Instructions (Addendum)
Asprecream with lidocaine ?Heat/ice ?Voltaren gel 4x per day  ?Lidocaine pain patch ? Tape allergy dont do it  ?Tylenol arthritis max 4000 mg total  ?If not working consider calling back for Tramadol  ?Tumeric capsules  ?Joint supplement over the counter ? ? ?Musculoskeletal: No worrisome lytic or sclerotic osseous ?abnormality. Degenerative changes noted lower lumbar spine. ?10/29/18 ? ? ?Multilevel cervical spine degenerative disc disease, severe at C5-C6 with trace retrolisthesis of C5 on C6. Multilevel facet arthrosis. ?Narrative ? ?EXAM: XR CERVICAL SPINE AP AND LATERAL  ?DATE: 08/09/2016 12:02 PM  ? ? ?MRI ?IMPRESSION: ?Moderate appearing right hip osteoarthritis with associated ?degenerative tearing of the anterior, superior labrum. The ?examination is otherwise unremarkable. ?  ?  ?Electronically Signed ?  By: Inge Rise M.D. ?  On: 09/19/2016 09:16 ? ?Arthritis ?Arthritis is a term that is commonly used to refer to joint pain or joint disease. There are more than 100 types of arthritis. ?What are the causes? ?The most common cause of this condition is wear and tear of a joint. Other causes include: ?Gout. ?Inflammation of a joint. ?An infection of a joint. ?Sprains and other injuries near the joint. ?A reaction to medicines or drugs, or an allergic reaction. ?In some cases, the cause may not be known. ?What are the signs or symptoms? ?The main symptom of this condition is pain in the joint during movement. Other symptoms include: ?Redness, swelling, or stiffness at a joint. ?Warmth coming from the joint. ?Fever. ?Overall feeling of illness. ?How is this diagnosed? ?This condition may be diagnosed with a physical exam and tests, including: ?Blood tests. ?Urine tests. ?Imaging tests, such as X-rays, an MRI, or a CT scan. ?Sometimes, fluid is removed from a joint for testing. ?How is this treated? ?This condition may be treated with: ?Treatment of the cause, if it is known. ?Rest. ?Raising (elevating) the  joint. ?Applying cold or hot packs to the joint. ?Medicines to improve symptoms and reduce inflammation. ?Injections of a steroid, such as cortisone, into the joint to help reduce pain and inflammation. ?Depending on the cause of your arthritis, you may need to make lifestyle changes to reduce stress on your joint. Changes may include: ?Exercising more. ?Losing weight. ?Follow these instructions at home: ?Medicines ?Take over-the-counter and prescription medicines only as told by your health care provider. ?Do not take aspirin to relieve pain if your health care provider thinks that gout may be causing your pain. ?Activity ?Rest your joint if told by your health care provider. Rest is important when your disease is active and your joint feels painful, swollen, or stiff. ?Avoid activities that make the pain worse. Balance activity with rest. ?Exercise your joint regularly with range-of-motion exercises as told by your health care provider. Try doing low-impact exercise, such as: ?Swimming. ?Water aerobics. ?Biking. ?Walking. ?Managing pain, stiffness, and swelling ? ?  ? ?If directed, put ice on the affected joint. To do this: ?Put ice in a plastic bag. ?Place a towel between your skin and the bag. ?Leave the ice on for 20 minutes, 2-3 times a day. ?Remove the ice if your skin turns bright red. This is very important. If you cannot feel pain, heat, or cold, you have a greater risk of damage to the area. ?If your joint is swollen, raise (elevate) it above the level of your heart if directed by your health care provider. ?If your joint feels stiff in the morning, try taking a warm shower. ?If directed, apply heat to  the affected area as often as told by your health care provider. Use the heat source that your health care provider recommends, such as a moist heat pack or a heating pad. To apply heat: ?Place a towel between your skin and the heat source. ?Leave the heat on for 20-30 minutes. ?Remove the heat if your skin  turns bright red. This is especially important if you are unable to feel pain, heat, or cold. You have a greater risk of getting burned. ?General instructions ?Maintain a healthy weight. Follow instructions from your health care provider for weight control. ?Do not use any products that contain nicotine or tobacco. These products include cigarettes, chewing tobacco, and vaping devices, such as e-cigarettes. If you need help quitting, ask your health care provider. ?Keep all follow-up visits. This is important. ?Where to find more information ?[employer]: www.niams.[employer].es ?Contact a health care provider if: ?The pain gets worse. ?You have a fever. ?Get help right away if: ?You develop severe joint pain, swelling, or redness. ?Many joints become painful and swollen. ?You develop severe back pain. ?You develop severe weakness in your leg. ?Summary ?Arthritis is a term that is commonly used to refer to joint pain or joint disease. There are more than 100 types of arthritis. ?The most common cause of this condition is wear and tear of a joint. Other causes include gout, inflammation or infection of the joint, sprains, or allergies. ?Symptoms of this condition include redness, swelling, or stiffness of the joint. Other symptoms include warmth, fever, or feeling ill. ?This condition is treated with rest, elevation, medicines, and applying cold or hot packs. ?Follow your health care provider's instructions about medicines, activity, exercises, and other home care treatments. ?This information is not intended to replace advice given to you by your health care provider. Make sure you discuss any questions you have with your health care provider. ?Document Revised: 08/22/2021 Document Reviewed: 08/22/2021 ?Elsevier Patient Education ? [place]. ? ?

## 2022-04-03 NOTE — Progress Notes (Signed)
Chief Complaint  ?Patient presents with  ? Acute Visit  ?  Pt c/o pain in lower back radiating to upper buttocks area ongoing x1-2 wks. Denies any falls or injuries, pain is burning/sharp. Worse on Sunday took 1 tylenol w/minor relief.   ? ?F/u with daughter [name]  ?C/o low back pain rad to b/l buttocks x 1-2 weeks Sunday worse but today 5/10 tried tylenol with some relief burning sharp pain to buttocks no falls or injuries  ?Musculoskeletal: No worrisome lytic or sclerotic osseous ?abnormality. Degenerative changes noted lower lumbar spine. ?10/29/18 ? ? ?Multilevel cervical spine degenerative disc disease, severe at C5-C6 with trace retrolisthesis of C5 on C6. Multilevel facet arthrosis. ?Narrative ? ?EXAM: XR CERVICAL SPINE AP AND LATERAL  ?DATE: 08/09/2016 12:02 PM  ? ? ?MRI ?IMPRESSION: ?Moderate appearing right hip osteoarthritis with associated ?degenerative tearing of the anterior, superior labrum. The ?examination is otherwise unremarkable. ? ?Review of Systems  ?Constitutional:  Negative for weight loss.  ?HENT:  Negative for hearing loss.   ?Eyes:  Negative for blurred vision.  ?Respiratory:  Negative for shortness of breath.   ?Cardiovascular:  Negative for chest pain.  ?Gastrointestinal:  Negative for abdominal pain and blood in stool.  ?Genitourinary:  Negative for dysuria.  ?Musculoskeletal:  Positive for back pain. Negative for falls and joint pain.  ?Skin:  Negative for rash.  ?Neurological:  Negative for headaches.  ?Psychiatric/Behavioral:  Negative for depression.   ?Past Medical History:  ?Diagnosis Date  ? Abdominal pain   ? ANA positive   ? 1:320 08/09/16   ? Aortic atherosclerosis ([place])   ? Arthritis   ? modreate right hips, C DD severe C5/6, b/l knees patellar enthesophytosis b/l   ? Bowel habit changes   ? pressure  ? Cancer [facility])   ? Carotid stenosis   ? b/l mild 06/06/16  ? Chronic constipation   ? Colon polyps   ? Colon polyps   ? x2 removal  ? Depression   ? Fibromyalgia   ? Heart disease  2012  ? Hemorrhoids   ? History of fainting spells of unknown cause   ? HLD (hyperlipidemia)   ? Inflammatory polyps of colon ([place])   ? Myoclonic jerking   ? Poor appetite   ? Psoriasis   ? Right leg DVT ([place])   ? fall 09/2020 [facility]ed [facility]  ? Shortness of breath dyspnea   ? Syncope   ? summer 2022  ? Tinnitus   ? Urinary urgency   ? UTI (urinary tract infection)   ? summer 2022  ? Vaginal polyp   ? Vitamin D deficiency   ? Vitamin D deficiency   ? ?Past Surgical History:  ?Procedure Laterality Date  ? BREAST SURGERY    ? left breast   ? ECTOPIC PREGNANCY SURGERY    ? ENDOMETRIAL BIOPSY    ? 07/23/16 neg bx  ? fallopian tube removal    ? HYSTEROSCOPY WITH D & C N/A 07/23/2016  ? Procedure: DILATATION AND CURETTAGE;  Surgeon: Benjaman Kindler, MD;  Location: [facility] ORS;  Service: Gynecology;  Laterality: N/A;  ? right foot surgery     ? TONSILLECTOMY    ? TUBAL LIGATION    ? ?Family History  ?Problem Relation Age of Onset  ? Heart disease Mother   ? Hypertension Mother   ? Arthritis Mother   ? Heart disease Father   ? Hypertension Father   ? ?Social History  ? ?Socioeconomic History  ?  Marital status: Widowed  ?  Spouse name: Not on file  ? Number of children: Not on file  ? Years of education: Not on file  ? Highest education level: Not on file  ?Occupational History  ? Not on file  ?Tobacco Use  ? Smoking status: Never  ? Smokeless tobacco: Never  ?Substance and Sexual Activity  ? Alcohol use: No  ?  Alcohol/week: 0.0 standard drinks  ? Drug use: No  ? Sexual activity: Not Currently  ?Other Topics Concern  ? Not on file  ?Social History Narrative  ? Lives with daughter [name]   ? Has 2 daughters   ? From [place]  ? 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 ?Social Determinants of Health  ? ?Financial Resource Strain: Not on file  ?Food Insecurity: Not on file  ?Transportation Needs: Not on file  ?Physical Activity: Not on file  ?Stress: Not on file  ?Social Connections: Not on file   ?Intimate Partner Violence: Not on file  ? ?Current Meds  ?Medication Sig  ? Cholecalciferol (VITAMIN D3) 1000 units CAPS Take 1 capsule by mouth daily.  ? Coenzyme Q10-Fish Oil-Vit E (CO-Q 10 OMEGA-3 FISH OIL) CAPS Take 2 capsules by mouth daily.  ? Polyethyl Glycol-Propyl Glycol (SYSTANE OP) Apply to eye.  ? ?Allergies  ?Allergen Reactions  ? Ciprofloxacin Nausea And Vomiting  ? Eliquis [Apixaban] Swelling  ?  Tongue swelling and numbness   ? Fish Allergy Other (See Comments)  ?  [place]  ? Fish-Derived Products Other (See Comments)  ?  [place]  ? Shellfish-Derived Products   ? ?No results found for this or any previous visit (from the past 2160 hour(s)). ?Objective  ?Body mass index is 20.37 kg/m?. ?Wt Readings from Last 3 Encounters:  ?04/03/22 126 lb 3.2 oz (57.2 kg)  ?02/13/22 129 lb 9.6 oz (58.8 kg)  ?12/12/21 128 lb 3.2 oz (58.2 kg)  ? ?Temp Readings from Last 3 Encounters:  ?04/03/22 (!) 97.5 ?F (36.4 ?C) (Oral)  ?02/13/22 98.3 ?F (36.8 ?C) (Oral)  ?12/12/21 (!) 96.9 ?F (36.1 ?C) (Temporal)  ? ?BP Readings from Last 3 Encounters:  ?04/03/22 120/80  ?02/13/22 122/70  ?12/12/21 136/74  ? ?Pulse Readings from Last 3 Encounters:  ?04/03/22 72  ?02/13/22 76  ?12/12/21 80  ? ? ?Physical Exam ?Vitals and nursing note reviewed.  ?Constitutional:   ?   Appearance: Normal appearance. She is well-developed and well-groomed.  ?HENT:  ?   Head: Normocephalic and atraumatic.  ?Eyes:  ?   Conjunctiva/sclera: Conjunctivae normal.  ?   Pupils: Pupils are equal, round, and reactive to light.  ?Cardiovascular:  ?   Rate and Rhythm: Normal rate and regular rhythm.  ?   Heart sounds: Normal heart sounds. No murmur heard. ?Pulmonary:  ?   Effort: Pulmonary effort is normal.  ?   Breath sounds: Normal breath sounds.  ?Abdominal:  ?   General: Abdomen is flat. Bowel sounds are normal.  ?   Tenderness: There is no abdominal tenderness.  ?Musculoskeletal:     ?   General: No tenderness.  ?   Lumbar back: No tenderness.   ?Skin: ?   General: Skin is warm and dry.  ?Neurological:  ?   General: No focal deficit present.  ?   Mental Status: She is alert and oriented to person, place, and time. Mental status is at baseline.  ?   Cranial Nerves: Cranial nerves 2-12 are  intact.  ?   Gait: Gait is intact.  ?Psychiatric:     ?   Attention and Perception: Attention and perception normal.     ?   Mood and Affect: Mood and affect normal.     ?   Speech: Speech normal.     ?   Behavior: Behavior normal. Behavior is cooperative.     ?   Thought Content: Thought content normal.     ?   Cognition and Memory: Cognition and memory normal.     ?   Judgment: Judgment normal.  ? ? ?Assessment  ?Plan  ?Arthritis of right hip ? ?Arthritis, lumbar spine ? ?DDD (degenerative disc disease), cervical  ?Musculoskeletal: No worrisome lytic or sclerotic osseous ?abnormality. Degenerative changes noted lower lumbar spine. ?10/29/18 ? ? ?Multilevel cervical spine degenerative disc disease, severe at C5-C6 with trace retrolisthesis of C5 on C6. Multilevel facet arthrosis. ?Narrative ? ?EXAM: XR CERVICAL SPINE AP AND LATERAL  ?DATE: 08/09/2016 12:02 PM  ? ? ?MRI ?IMPRESSION: ?Moderate appearing right hip osteoarthritis with associated ?degenerative tearing of the anterior, superior labrum. The ?examination is otherwise unremarkable. ? ?Asprecream with lidocaine ?Heat/ice ?Voltaren gel 4x per day  ?Lidocaine pain patch ? Tape allergy dont do it  ?Tylenol arthritis max 4000 mg total  ?If not working consider calling back for Tramadol  ?Tumeric capsules  ?Joint supplement over the counter ? ?Hm ?See last visit  ?Provider: Dr. Olivia Mackie McLean-Scocuzza-Internal Medicine  ?

## 2022-06-05 ENCOUNTER — Ambulatory Visit (INDEPENDENT_AMBULATORY_CARE_PROVIDER_SITE_OTHER): Payer: Medicare Other | Admitting: Podiatry

## 2022-06-05 DIAGNOSIS — B351 Tinea unguium: Secondary | ICD-10-CM

## 2022-06-05 DIAGNOSIS — M79674 Pain in right toe(s): Secondary | ICD-10-CM

## 2022-06-05 DIAGNOSIS — D689 Coagulation defect, unspecified: Secondary | ICD-10-CM

## 2022-06-05 DIAGNOSIS — M79675 Pain in left toe(s): Secondary | ICD-10-CM | POA: Diagnosis not present

## 2022-06-05 NOTE — Progress Notes (Signed)
Subjective:  Patient ID: Roxan Diesel, female    DOB: [birth year],  MRN: [id number]  Chief Complaint  Patient presents with   Nail Problem    "Routine care, I been dealing with these hammer toes more and more."    88 y.o. female returns for the above complaint.  Patient presents with thickened elongated dystrophic toenails x10.  Patient would like to have them debrided down.  She states is painful to touch.  She states she is not able to take care of it herself.  Patient is on a blood thinner.  She denies any secondary complaint today.  She would like a refill on her lidocaine ointment  Objective:   There were no vitals filed for this visit. Podiatric Exam: Vascular: dorsalis pedis and posterior tibial pulses are palpable bilateral. Capillary return is immediate. Temperature gradient is WNL. Skin turgor WNL  Sensorium: Sporadic numbness tingling noted.  Normal sinus monofilament testing noted.  Normal normal tactile sensation noted. Nail Exam: Pt has thick disfigured discolored nails with subungual debris noted bilateral entire nail hallux through fifth toenails Ulcer Exam: There is no evidence of ulcer or pre-ulcerative changes or infection. Orthopedic Exam: Muscle tone and strength are WNL. No limitations in general ROM. No crepitus or effusions noted. HAV  B/L.  Hammer toes 2-5  B/L. Skin: No Porokeratosis. No infection or ulcers.  Mild ingrown noted of the right hallux without any paronychia or clinical signs of infection.  Heloma molle noted to bilateral first and second digit facing sides.  Mild pain on palpation  Assessment & Plan:  Patient was evaluated and treated and all questions answered.  Numbness tingling to both of her feet -X-ray of the patient etiology of numbness tingling various treatment options were extensively discussed.  I believe patient will benefit from lidocaine patch to help with the numbness tingling.  She is already has taken gabapentin and it does not  help. -Lidocaine ointment was sent to the pharmacy again.   Bilateral severe bunion deformity with hammertoe contractures of 2 through 5 -The spacer and offloading with shoe gear modification has helped tremendously.  Patient still has some pain but much more manageable.  Patient just seems to continue to have the same discussion without any acute management.  She also has been noncompliant with conservative treatment options including applying toe pressure is appropriately.  Right hallux mild ingrown -I explained to the patient the etiology of ingrown and likely the hallux valgus rotation of the big toe into the second toe is still primary cause of the pain associated with this.  Unfortunately I will hold off on removal given patient's age and the pain associated with this has to do with the biomechanical deformity of the toe.  I believe she will benefit from a toe spacer.  Toe spacer was dispensed.  Bilateral first and second facing surface heloma molle -I explained to the patient the etiology, heloma molle and various treatment options were discussed.  I have encouraged her to continue using toe protectors and spacers at this time.  This is very mild in nature.  Patient states understanding  Onychomycosis with pain  -Nails palliatively debrided as below. -Educated on self-care  Procedure: Nail Debridement Rationale: pain  Type of Debridement: manual, sharp debridement. Instrumentation: Nail nipper, rotary burr. Number of Nails: 10  Procedures and Treatment: Consent by patient was obtained for treatment procedures. The patient understood the discussion of treatment and procedures well. All questions were answered thoroughly reviewed. Debridement of mycotic and  hypertrophic toenails, 1 through 5 bilateral and clearing of subungual debris. No ulceration, no infection noted.  Return Visit-Office Procedure: Patient instructed to return to the office for a follow up visit 3 months for continued  evaluation and treatment.  Boneta Lucks, DPM    No follow-ups on file.      Boneta Lucks, DPM    No follow-ups on file.

## 2022-06-13 ENCOUNTER — Telehealth: Payer: Self-pay | Admitting: Internal Medicine

## 2022-06-13 ENCOUNTER — Encounter: Payer: Self-pay | Admitting: Internal Medicine

## 2022-06-13 ENCOUNTER — Ambulatory Visit (INDEPENDENT_AMBULATORY_CARE_PROVIDER_SITE_OTHER): Payer: Medicare Other | Admitting: Internal Medicine

## 2022-06-13 VITALS — BP 116/66 | HR 77 | Temp 95.9°F | Ht 66.0 in | Wt 124.6 lb

## 2022-06-13 DIAGNOSIS — H5789 Other specified disorders of eye and adnexa: Secondary | ICD-10-CM | POA: Diagnosis not present

## 2022-06-13 DIAGNOSIS — Z1329 Encounter for screening for other suspected endocrine disorder: Secondary | ICD-10-CM | POA: Diagnosis not present

## 2022-06-13 DIAGNOSIS — E559 Vitamin D deficiency, unspecified: Secondary | ICD-10-CM

## 2022-06-13 DIAGNOSIS — R946 Abnormal results of thyroid function studies: Secondary | ICD-10-CM

## 2022-06-13 DIAGNOSIS — E785 Hyperlipidemia, unspecified: Secondary | ICD-10-CM

## 2022-06-13 DIAGNOSIS — H1033 Unspecified acute conjunctivitis, bilateral: Secondary | ICD-10-CM

## 2022-06-13 LAB — CBC WITH DIFFERENTIAL/PLATELET
Basophils Absolute: 0.1 10*3/uL (ref 0.0–0.1)
Basophils Relative: 1 % (ref 0.0–3.0)
Eosinophils Absolute: 0 10*3/uL (ref 0.0–0.7)
Eosinophils Relative: 0.5 % (ref 0.0–5.0)
HCT: 38.8 % (ref 36.0–46.0)
Hemoglobin: 12.9 g/dL (ref 12.0–15.0)
Lymphocytes Relative: 21.6 % (ref 12.0–46.0)
Lymphs Abs: 1.3 10*3/uL (ref 0.7–4.0)
MCHC: 33.2 g/dL (ref 30.0–36.0)
MCV: 88.5 fl (ref 78.0–100.0)
Monocytes Absolute: 0.5 10*3/uL (ref 0.1–1.0)
Monocytes Relative: 7.6 % (ref 3.0–12.0)
Neutro Abs: 4.3 10*3/uL (ref 1.4–7.7)
Neutrophils Relative %: 69.3 % (ref 43.0–77.0)
Platelets: 268 10*3/uL (ref 150.0–400.0)
RBC: 4.38 Mil/uL (ref 3.87–5.11)
RDW: 13.1 % (ref 11.5–15.5)
WBC: 6.2 10*3/uL (ref 4.0–10.5)

## 2022-06-13 LAB — LIPID PANEL
Cholesterol: 239 mg/dL — ABNORMAL HIGH (ref 0–200)
HDL: 66.8 mg/dL (ref >39.00)
LDL Cholesterol: 159 mg/dL — ABNORMAL HIGH (ref 0–99)
NonHDL: 172.59
Total CHOL/HDL Ratio: 4
Triglycerides: 70 mg/dL (ref 0.0–149.0)
VLDL: 14 mg/dL (ref 0.0–40.0)

## 2022-06-13 LAB — COMPREHENSIVE METABOLIC PANEL
ALT: 19 U/L (ref 0–35)
AST: 23 U/L (ref 0–37)
Albumin: 4 g/dL (ref 3.5–5.2)
Alkaline Phosphatase: 97 U/L (ref 39–117)
BUN: 10 mg/dL (ref 6–23)
CO2: 25 mEq/L (ref 19–32)
Calcium: 9 mg/dL (ref 8.4–10.5)
Chloride: 106 mEq/L (ref 96–112)
Creatinine, Ser: 0.71 mg/dL (ref 0.40–1.20)
GFR: 72.28 mL/min (ref >60.00)
Glucose, Bld: 89 mg/dL (ref 70–99)
Potassium: 4.1 mEq/L (ref 3.5–5.1)
Sodium: 139 mEq/L (ref 135–145)
Total Bilirubin: 1 mg/dL (ref 0.2–1.2)
Total Protein: 6.4 g/dL (ref 6.0–8.3)

## 2022-06-13 LAB — TSH: TSH: 3.74 u[IU]/mL (ref 0.35–5.50)

## 2022-06-13 LAB — VITAMIN D 25 HYDROXY (VIT D DEFICIENCY, FRACTURES): VITD: 47.18 ng/mL (ref 30.00–100.00)

## 2022-06-13 MED ORDER — TOBRAMYCIN-DEXAMETHASONE 0.3-0.05 % OP SUSP: 1.0000 [drp] | Freq: Two times a day (BID) | OPHTHALMIC | 0 refills | Status: DC

## 2022-06-13 NOTE — Progress Notes (Signed)
Chief Complaint  Patient presents with   Follow-up   F/u with daughter brenda 1. Lower eyelids red x long term  2. Hld abnormal thyroid lab will check labs today    Review of Systems  Constitutional:  Negative for weight loss.  HENT:  Negative for hearing loss.   Eyes:  Negative for blurred vision.  Respiratory:  Negative for shortness of breath.   Cardiovascular:  Negative for chest pain.  Gastrointestinal:  Negative for abdominal pain and blood in stool.  Genitourinary:  Negative for dysuria.  Musculoskeletal:  Negative for falls and joint pain.  Skin:  Negative for rash.  Neurological:  Negative for headaches.  Psychiatric/Behavioral:  Negative for depression.    Past Medical History:  Diagnosis Date   Abdominal pain    ANA positive    1:320 08/09/16    Aortic atherosclerosis (HCC)    Arthritis    modreate right hips, C DD severe C5/6, b/l knees patellar enthesophytosis b/l    Bowel habit changes    pressure   Cancer (HCC)    Carotid stenosis    b/l mild 06/06/16   Chronic constipation    Colon polyps    Colon polyps    x2 removal   Depression    Fibromyalgia    Heart disease 2012   Hemorrhoids    History of fainting spells of unknown cause    HLD (hyperlipidemia)    Inflammatory polyps of colon ([place])    Myoclonic jerking    Poor appetite    Psoriasis    Right leg DVT ([place])    fall 09/2020 dx'ed [facility]   Shortness of breath dyspnea    Syncope    summer 2022   Tinnitus    Urinary urgency    UTI (urinary tract infection)    summer 2022   Vaginal polyp    Vitamin D deficiency    Vitamin D deficiency    Past Surgical History:  Procedure Laterality Date   BREAST SURGERY     left breast    ECTOPIC PREGNANCY SURGERY     ENDOMETRIAL BIOPSY     07/23/16 neg bx   fallopian tube removal     HYSTEROSCOPY WITH D & C N/A 07/23/2016   Procedure: DILATATION AND CURETTAGE;  Surgeon: Benjaman Kindler, MD;  Location: [facility] ORS;  Service:  Gynecology;  Laterality: N/A;   right foot surgery      TONSILLECTOMY     TUBAL LIGATION     Family History  Problem Relation Age of Onset   Heart disease Mother    Hypertension Mother    Arthritis Mother    Heart disease Father    Hypertension Father    Social History   Socioeconomic History   Marital status: Widowed    Spouse name: Not on file   Number of children: Not on file   Years of education: Not on file   Highest education level: Not on file  Occupational History   Not on file  Tobacco Use   Smoking status: Never   Smokeless tobacco: Never  Substance and Sexual Activity   Alcohol use: No    Alcohol/week: 0.0 standard drinks of alcohol   Drug use: No   Sexual activity: Not Currently  Other Topics Concern   Not on file  Social History Narrative   Lives with daughter [name]    Has 2 daughters    From [place]   89 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   Coenzyme Q10-Fish Oil-Vit E (CO-Q 10 OMEGA-3 FISH OIL) CAPS Take 2 capsules by mouth daily.   [DISCONTINUED] Tobramycin-Dexamethasone 0.3-0.05 % SUSP Apply 1 drop to eye 2 (two) times daily. X  4-7 days   Allergies  Allergen Reactions   Ciprofloxacin Nausea And Vomiting   Eliquis [Apixaban] Swelling    Tongue swelling and numbness    Fish Allergy Other (See Comments)    Perch Fish   Fish-Derived Products Other (See Comments)    Perch Fish   Shellfish-Derived Products    No results found for this or any previous visit (from the past 2160 hour(s)). Objective  Body mass index is 20.11 kg/m. Wt Readings from Last 3  Encounters:  06/13/22 124 lb 9.6 oz (56.5 kg)  04/03/22 126 lb 3.2 oz (57.2 kg)  02/13/22 129 lb 9.6 oz (58.8 kg)   Temp Readings from Last 3 Encounters:  06/13/22 (!) 95.9 F (35.5 C) (Other (Comment))  04/03/22 (!) 97.5 F (36.4 C) (Oral)  02/13/22 98.3 F (36.8 C) (Oral)   BP Readings from Last 3 Encounters:  06/13/22 116/66  04/03/22 120/80  02/13/22 122/70   Pulse Readings from Last 3 Encounters:  06/13/22 77  04/03/22 72  02/13/22 76    Physical Exam Vitals and nursing note reviewed.  Constitutional:      Appearance: Normal appearance. She is well-developed and well-groomed.  HENT:     Head: Normocephalic and atraumatic.  Eyes:     Conjunctiva/sclera: Conjunctivae normal.     Pupils: Pupils are equal, round, and reactive to light.  Cardiovascular:     Rate and Rhythm: Normal rate and regular rhythm.     Heart sounds: Normal heart sounds. No murmur heard. Pulmonary:     Effort: Pulmonary effort is normal.     Breath sounds: Normal breath sounds.  Abdominal:     General: Abdomen is flat. Bowel sounds are normal.     Tenderness: There is no abdominal tenderness.  Musculoskeletal:        General: No tenderness.  Skin:    General: Skin is warm and dry.  Neurological:     General: No focal deficit present.     Mental Status: She is alert and oriented to person, place, and time. Mental status is at baseline.     Cranial Nerves: Cranial nerves 2-12 are intact.     Motor: Motor function is intact.     Coordination: Coordination is intact.     Gait: Gait is intact.  Psychiatric:        Attention and Perception: Attention and perception normal.        Mood and Affect: Mood and affect normal.        Speech: Speech normal.        Behavior: Behavior normal. Behavior is cooperative.        Thought Content: Thought content normal.        Cognition and Memory: Cognition and memory normal.        Judgment: Judgment normal.     Assessment  Plan  Redness of both  eyes with gritty feeling tried systane eye dorps - Plan: Tobramycin-Dexamethasone 0.3-0.05 % SUSP, DISCONTINUED: Tobramycin-Dexamethasone 0.3-0.05 % SUSP F/u eye md prn  Hyperlipidemia, unspecified hyperlipidemia type - Plan: Comprehensive metabolic panel, Lipid panel, CBC with Differential/Platelet   Abnormal thyroid function test - Plan: TSH   HM Declines flu shot  Pfizer 2/2 disc booster  Reviewed 11/01/20 [facility] (vaccines prev declined vaccines (tdap, prevnar, flu), imaging I.e echo reviewed nl mild DD 11/2019, notes)    Out of age window colonoscopy (fobt neg x3 06/13/20), dexa, pap, mammogram    Provider: Dr. Olivia Mackie [facility]

## 2022-06-13 NOTE — Patient Instructions (Signed)
Tobramycin; Dexamethasone Eye Suspension What is this medication? TOBRAMYCIN; DEXAMETHASONE (toe bra MYE sin; dex a METH a sone) treats eye swelling, redness, or itching caused by infections, injury, or other conditions. It also prevents and treats eye infections caused by bacteria. It is a combination of an antibiotic and a steroid. This medicine may be used for other purposes; ask your health care provider or pharmacist if you have questions. COMMON BRAND NAME(S): Tobradex, Tobradex ST What should I tell my care team before I take this medication? They need to know if you have any of these conditions: Eye disease Glaucoma Vision problems An unusual or allergic reaction to tobramycin, dexamethasone, other medications, foods, dyes, or preservatives Pregnant or trying to get pregnant Breast-feeding How should I use this medication? This medication is only for use in the eye. Follow the directions on the prescription label. Wash hands before and after use. Shake well before using. Tilt your head back slightly and pull your lower eyelid down with your index finger to form a pouch. Try not to touch the tip of the dropper or tube to your eye, fingertips, or other surfaces. Squeeze the prescribed number of drops into the pouch. Close the eye gently to spread the drops. Do not use your medication more often than directed. Finish the full course of medication prescribed by your care team even if you think your condition is better. Do not stop using except on the advice of your care team. Talk to your care team about the use of this medication in children. While it may be prescribed for children as young as 2 years for selected conditions, precautions do apply. Overdosage: If you think you have taken too much of this medicine contact a poison control center or emergency room at once. NOTE: This medicine is only for you. Do not share this medicine with others. What if I miss a dose? If you miss a dose, use  it as soon as you can. If it is almost time for your next dose, use only that dose. Do not use double or extra doses. What may interact with this medication? Interactions are not expected. Do not use any other eye products without talking to your care team. This list may not describe all possible interactions. Give your health care provider a list of all the medicines, herbs, non-prescription drugs, or dietary supplements you use. Also tell them if you smoke, drink alcohol, or use illegal drugs. Some items may interact with your medicine. What should I watch for while using this medication? Visit your care team for regular checks on your progress. Tell your care team if your symptoms do not start to get better or if they get worse. You should not wear contact lenses while you have signs and symptoms of an eye infection. If you wear contact lenses, ask your care team when you can wear your lenses again. To prevent the spread of infection, do not share eye products, towels, and washcloths with anyone else. This medication may cause cataracts or glaucoma, especially with long-term use. You should have regular eye exams while taking this medication. Tell your care team if you have changes in your eyesight. What side effects may I notice from receiving this medication? Side effects that you should report to your care team as soon as possible: Allergic reactions--skin rash, itching, hives, swelling of the face, lips, tongue, or throat New or worsening eye pain, redness, irritation, or discharge Side effects that usually do not require medical attention (  report to your care team if they continue or are bothersome): Eye irritation or itching This list may not describe all possible side effects. Call your doctor for medical advice about side effects. You may report side effects to FDA at 1-800-FDA-1088. Where should I keep my medication? Keep out of the reach of children and pets. Store between 8 and 27  degrees C (46 and 80 degrees F). Get rid of any unused medication after the expiration date. To get rid of medications that are no longer needed or have expired: Take the medication to a medication take-back program. Check with your pharmacy or law enforcement to find a location. If you cannot return the medication, check the label or package insert to see if the medication should be thrown out in the garbage or flushed down the toilet. If you are not sure, ask your care team. If it is safe to put it in the trash, empty the medication out of the container. Mix the medication with cat litter, dirt, coffee grounds, or other unwanted substance. Seal the mixture in a bag or container. Put it in the trash. NOTE: This sheet is a summary. It may not cover all possible information. If you have questions about this medicine, talk to your doctor, pharmacist, or health care provider.  2023 Elsevier/Gold Standard (2022-01-08 00:00:00)

## 2022-06-13 NOTE — Telephone Encounter (Signed)
Pharmacy called regarding a medication change and would like to be called back-Tobramycin-Dexamethasone

## 2022-06-26 ENCOUNTER — Telehealth: Payer: Self-pay | Admitting: Internal Medicine

## 2022-06-26 NOTE — Telephone Encounter (Signed)
Patients daughter is calling because today her mom's legs and ankles are swelling and turning red.  Called was transferred to Access Nurse: Ignacia Palma

## 2022-06-26 NOTE — Telephone Encounter (Signed)
Spoke with pt& pt's daughter and told her Dr. Olivia Mackie suggested ordering an US& Echo pt refused and also refused to go to urgent care.

## 2022-06-26 NOTE — Telephone Encounter (Signed)
Patient's daughter Carmen Humphrey was transferred back to office. [place] office transferred call to Dr Oleh Genin' s [place]. No appointments available in office. Patient refused to go to hospital.

## 2022-06-26 NOTE — Telephone Encounter (Signed)
I offered to order echo and DVT US b/l legs for pt she declined  Rec ED otherwise

## 2022-07-25 ENCOUNTER — Ambulatory Visit (INDEPENDENT_AMBULATORY_CARE_PROVIDER_SITE_OTHER): Payer: Medicare Other

## 2022-07-25 ENCOUNTER — Ambulatory Visit (INDEPENDENT_AMBULATORY_CARE_PROVIDER_SITE_OTHER): Payer: Medicare Other | Admitting: Internal Medicine

## 2022-07-25 ENCOUNTER — Encounter: Payer: Self-pay | Admitting: Internal Medicine

## 2022-07-25 VITALS — Temp 98.6°F | Ht 66.0 in | Wt 122.6 lb

## 2022-07-25 DIAGNOSIS — R030 Elevated blood-pressure reading, without diagnosis of hypertension: Secondary | ICD-10-CM

## 2022-07-25 DIAGNOSIS — R202 Paresthesia of skin: Secondary | ICD-10-CM

## 2022-07-25 DIAGNOSIS — I6381 Other cerebral infarction due to occlusion or stenosis of small artery: Secondary | ICD-10-CM

## 2022-07-25 DIAGNOSIS — H1033 Unspecified acute conjunctivitis, bilateral: Secondary | ICD-10-CM | POA: Diagnosis not present

## 2022-07-25 DIAGNOSIS — M25562 Pain in left knee: Secondary | ICD-10-CM | POA: Diagnosis not present

## 2022-07-25 DIAGNOSIS — I38 Endocarditis, valve unspecified: Secondary | ICD-10-CM

## 2022-07-25 DIAGNOSIS — H5789 Other specified disorders of eye and adnexa: Secondary | ICD-10-CM

## 2022-07-25 DIAGNOSIS — W19XXXD Unspecified fall, subsequent encounter: Secondary | ICD-10-CM

## 2022-07-25 DIAGNOSIS — H9313 Tinnitus, bilateral: Secondary | ICD-10-CM

## 2022-07-25 MED ORDER — BLOOD PRESSURE KIT: 1.0000 | PACK | Freq: Every day | 0 refills | Status: DC

## 2022-07-25 MED ORDER — TOBRAMYCIN-DEXAMETHASONE 0.3-0.05 % OP SUSP: 1.0000 [drp] | Freq: Two times a day (BID) | OPHTHALMIC | 0 refills | Status: DC

## 2022-07-25 NOTE — Patient Instructions (Addendum)
Pick up eye drops  We will do echo of your heart at [facility] hospital  Drink 55 ounces of water daily  (6-7 (8oz bottles)  Tylenol for knee pain  Voltaren gel   Let me know if you need medications for anxiety    Monitor blood pressure at home

## 2022-07-25 NOTE — Progress Notes (Addendum)
Chief Complaint  Patient presents with   Dehydration    Pt presents for balance issues and dehydration and also stated her ankles swell and hurt and her knee hurts at times as well    F/u with daughter  1. C/w balance issues and dehydration 06/13/22 labs nl cr 0.71 with GFR 72.29  Orthostatics checked today lying 145/87 hr 75, sitting 149/88 hr 77, standing 152/88 hr 85  She did have a fall at home. She has done home PT at home recently in the past   2. Left knee front of knee pain intermittently nothing tried  3. C/o b/l ankle lower leg swelling and itching  4. Scalp feels like something is crawling but on the inside of brain not the scalp. She reports she is clean and washes her hair daily though she is c/w lice she denies visual or hearing hallucinations but has had this in the past per her daughter. She does have dementia which is stable for now  5. H/o Mitral valve insuff. With last echo done in 2016 mild to moderate will repeat echo with leg swelling  6. Lower eyelids red and still has eyelid crust never picked up eyedrops sent last month but wants to try this again eye irritated and feels gritty in her eyes     Review of Systems  Constitutional:  Negative for weight loss.  HENT:  Positive for tinnitus. Negative for hearing loss.   Eyes:  Negative for blurred vision.  Respiratory:  Negative for shortness of breath.   Cardiovascular:  Negative for chest pain.  Gastrointestinal:  Negative for abdominal pain and blood in stool.  Genitourinary:  Negative for dysuria.  Musculoskeletal:  Positive for falls and joint pain.  Skin:  Negative for rash.  Neurological:  Negative for headaches.  Psychiatric/Behavioral:  Negative for depression.    Past Medical History:  Diagnosis Date   Abdominal pain    ANA positive    1:320 08/09/16    Aortic atherosclerosis (HCC)    Arthritis    modreate right hips, C DD severe C5/6, b/l knees patellar enthesophytosis b/l    Bowel habit changes     pressure   Cancer (HCC)    Carotid stenosis    b/l mild 06/06/16   Chronic constipation    Colon polyps    Colon polyps    x2 removal   Depression    Fibromyalgia    Heart disease 2012   Hemorrhoids    History of fainting spells of unknown cause    HLD (hyperlipidemia)    Inflammatory polyps of colon ([place])    Myoclonic jerking    Poor appetite    Psoriasis    Right leg DVT ([place])    fall 09/2020 dx'ed [facility]   Shortness of breath dyspnea    Syncope    summer 2022   Tinnitus    Urinary urgency    UTI (urinary tract infection)    summer 2022   Vaginal polyp    Vitamin D deficiency    Vitamin D deficiency    Past Surgical History:  Procedure Laterality Date   BREAST SURGERY     left breast    ECTOPIC PREGNANCY SURGERY     ENDOMETRIAL BIOPSY     07/23/16 neg bx   fallopian tube removal     HYSTEROSCOPY WITH D & C N/A 07/23/2016   Procedure: DILATATION AND CURETTAGE;  Surgeon: Benjaman Kindler, MD;  Location: [facility] ORS;  Service: Gynecology;  Laterality: N/A;   right foot surgery      TONSILLECTOMY     TUBAL LIGATION     Family History  Problem Relation Age of Onset   Heart disease Mother    Hypertension Mother    Arthritis Mother    Heart disease Father    Hypertension Father    Social History   Socioeconomic History   Marital status: Widowed    Spouse name: Not on file   Number of children: Not on file   Years of education: Not on file   Highest education level: Not on file  Occupational History   Not on file  Tobacco Use   Smoking status: Never   Smokeless tobacco: Never  Substance and Sexual Activity   Alcohol use: No    Alcohol/week: 0.0 standard drinks of alcohol   Drug use: No   Sexual activity: Not Currently  Other Topics Concern   Not on file  Social History Narrative   Lives with daughter [name]    Has 2 daughters    From [place]   64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   Coenzyme Q10-Fish Oil-Vit E (CO-Q 10 OMEGA-3 FISH OIL) CAPS Take 2 capsules by mouth daily.   [DISCONTINUED] Blood Pressure KIT 1 kit by Does not apply route daily. Automatic upper arm check daily   [DISCONTINUED] Tobramycin-Dexamethasone 0.3-0.05 % SUSP Apply 1 drop to eye 2 (two) times daily. X  4-7 days   Allergies  Allergen Reactions   Ciprofloxacin Nausea And Vomiting   Eliquis [Apixaban] Swelling    Tongue swelling and numbness    Fish Allergy Other (See Comments)    Perch Fish   Fish-Derived Products Other (See Comments)    Perch Fish   [employer]    Recent Results (from the past 2160 hour(s))  Comprehensive metabolic panel     Status: None   Collection Time: 06/13/22 10:51 AM  Result Value Ref Range   Sodium 139 135 - 145 mEq/L   Potassium 4.1 3.5 - 5.1 mEq/L   Chloride 106 96 - 112 mEq/L   CO2 25 19 - 32 mEq/L   Glucose, Bld 89 70 - 99 mg/dL   BUN 10 6 - 23 mg/dL   Creatinine, Ser 0.71 0.40 - 1.20 mg/dL   Total Bilirubin 1.0 0.2 - 1.2 mg/dL   Alkaline Phosphatase 97 39 - 117 U/L   AST 23 0 - 37 U/L   ALT 19 0 - 35 U/L   Total Protein 6.4 6.0 - 8.3 g/dL   Albumin 4.0 3.5 - 5.2 g/dL   GFR 72.28 >60.00 mL/min    Comment: Calculated using the CKD-EPI Creatinine Equation (2021)   Calcium 9.0 8.4 - 10.5 mg/dL  Lipid panel     Status: Abnormal   Collection Time: 06/13/22 10:51 AM  Result Value Ref Range   Cholesterol 239 (H) 0 - 200 mg/dL    Comment: ATP III Classification       Desirable:  < 200 mg/dL  Borderline High:  200 - 239 mg/dL          High:  > = 240 mg/dL   Triglycerides 70.0 0.0  - 149.0 mg/dL    Comment: Normal:  <150 mg/dLBorderline High:  150 - 199 mg/dL   HDL 66.80 >39.00 mg/dL   VLDL 14.0 0.0 - 40.0 mg/dL   LDL Cholesterol 159 (H) 0 - 99 mg/dL   Total CHOL/HDL Ratio 4     Comment:                Men          Women1/2 Average Risk     3.4          3.3Average Risk          5.0          4.42X Average Risk          9.6          7.13X Average Risk          15.0          11.0                       NonHDL 172.59     Comment: NOTE:  Non-HDL goal should be 30 mg/dL higher than patient's LDL goal (i.e. LDL goal of < 70 mg/dL, would have non-HDL goal of < 100 mg/dL)  CBC with Differential/Platelet     Status: None   Collection Time: 06/13/22 10:51 AM  Result Value Ref Range   WBC 6.2 4.0 - 10.5 K/uL   RBC 4.38 3.87 - 5.11 Mil/uL   Hemoglobin 12.9 12.0 - 15.0 g/dL   HCT 38.8 36.0 - 46.0 %   MCV 88.5 78.0 - 100.0 fl   MCHC 33.2 30.0 - 36.0 g/dL   RDW 13.1 11.5 - 15.5 %   Platelets 268.0 150.0 - 400.0 K/uL   Neutrophils Relative % 69.3 43.0 - 77.0 %   Lymphocytes Relative 21.6 12.0 - 46.0 %   Monocytes Relative 7.6 3.0 - 12.0 %   Eosinophils Relative 0.5 0.0 - 5.0 %   Basophils Relative 1.0 0.0 - 3.0 %   Neutro Abs 4.3 1.4 - 7.7 K/uL   Lymphs Abs 1.3 0.7 - 4.0 K/uL   Monocytes Absolute 0.5 0.1 - 1.0 K/uL   Eosinophils Absolute 0.0 0.0 - 0.7 K/uL   Basophils Absolute 0.1 0.0 - 0.1 K/uL  TSH     Status: None   Collection Time: 06/13/22 10:51 AM  Result Value Ref Range   TSH 3.74 0.35 - 5.50 uIU/mL  Vitamin D (25 hydroxy)     Status: None   Collection Time: 06/13/22 10:51 AM  Result Value Ref Range   VITD 47.18 30.00 - 100.00 ng/mL   Objective  Body mass index is 19.79 kg/m. Wt Readings from Last 3 Encounters:  07/25/22 122 lb 9.6 oz (55.6 kg)  06/13/22 124 lb 9.6 oz (56.5 kg)  04/03/22 126 lb 3.2 oz (57.2 kg)   Temp Readings from Last 3 Encounters:  07/25/22 98.6 F (37 C) (Oral)  06/13/22 (!) 95.9 F (35.5 C) (Other (Comment))  04/03/22 (!) 97.5 F  (36.4 C) (Oral)   BP Readings from Last 3 Encounters:  06/13/22 116/66  04/03/22 120/80  02/13/22 122/70   Pulse Readings from Last 3 Encounters:  06/13/22 77  04/03/22 72  02/13/22 76    Physical Exam Vitals and nursing note reviewed.  Constitutional:  Appearance: Normal appearance. She is well-developed and well-groomed.  HENT:     Head: Normocephalic and atraumatic.  Eyes:     Conjunctiva/sclera: Conjunctivae normal.     Pupils: Pupils are equal, round, and reactive to light.  Cardiovascular:     Rate and Rhythm: Normal rate and regular rhythm.     Heart sounds: Normal heart sounds. No murmur heard. Pulmonary:     Effort: Pulmonary effort is normal.     Breath sounds: Normal breath sounds.  Abdominal:     General: Abdomen is flat. Bowel sounds are normal.     Tenderness: There is no abdominal tenderness.  Musculoskeletal:        General: No tenderness.       Legs:  Skin:    General: Skin is warm and dry.  Neurological:     General: No focal deficit present.     Mental Status: She is alert and oriented to person, place, and time. Mental status is at baseline.     Cranial Nerves: Cranial nerves 2-12 are intact.     Gait: Gait is intact.  Psychiatric:        Attention and Perception: Attention and perception normal.        Mood and Affect: Mood and affect normal.        Speech: Speech normal.        Behavior: Behavior normal. Behavior is cooperative.        Thought Content: Thought content normal.        Cognition and Memory: Cognition and memory normal.        Judgment: Judgment normal.     Assessment  Plan  Tinnitis/Sensation of skin crawling head/scalp/tinnitis Reviewed with pt and daughter this could be manifestation of anxiety she also has h/o dementia has had prior hallucinations not currently this could  neurological manifestation as well  With h/o lacunar infarct will also order CT head recent fall  She will call back if wants medication for  anxiety she also has h/o dementia we could consider low dose risperdal  Her hearing is well but disc consider ent hearing test tinnitis in the future Pt hesitant always to take medications   Redness of both eyes lower eyelid ?blepheritis vs bacterial  - Plan: Tobramycin-Dexamethasone 0.3-0.05 % SUSP She is also est with the eye md   Acute pain of left knee - Plan: DG Knee Complete 4 Views Left 07/25/22 FINDINGS: Imaging obtained standing. No fracture or dislocation. No significant knee joint effusion. Minor medial tibiofemoral joint space narrowing. Unusual for age, no significant osteophytes. No erosions or bone destruction. Quadriceps tendon enthesophyte. Peripheral vascular calcifications are seen   IMPRESSION: Minimal joint space narrowing. Small quadriceps tendon enthesophyte. No acute osseous findings.     Electronically Signed   By: Keith Rake M.D.   On: 07/25/2022 15:00 Consider ortho in the future will refer [facility] Tylenol for knee pain  Voltaren gel   Valvular heart disease noted in 2016 echo with b/l leg swelling will do repeat echo  Echo 2016 - INTERPRETATION NORMAL LEFT VENTRICULAR SYSTOLIC FUNCTION WITH AN ESTIMATED EF = 55=60 % NORMAL RIGHT VENTRICULAR SYSTOLIC FUNCTION MILD-TO-MODERATE MITRAL -  Plan: ECHOCARDIOGRAM COMPLETE  Elevated blood pressure reading - Plan: Blood Pressure KIT, DISCONTINUED: Blood Pressure KIT Monitor BP at home  HM Declines flu shot  Pfizer 2/2 disc booster  Reviewed 11/01/20 [facility] (vaccines prev declined vaccines (tdap, prevnar, flu), imaging I.e echo reviewed nl mild DD 11/2019, notes)  Out of age window colonoscopy (fobt neg x3 06/13/20), dexa, pap, mammogram    Provider: Dr. Olivia Mackie [facility]

## 2022-07-30 NOTE — Addendum Note (Signed)
Addended by: Orland Mustard on: 07/30/2022 06:42 PM   Modules accepted: Orders

## 2022-08-01 ENCOUNTER — Encounter: Payer: Self-pay | Admitting: Internal Medicine

## 2022-08-01 ENCOUNTER — Ambulatory Visit (INDEPENDENT_AMBULATORY_CARE_PROVIDER_SITE_OTHER): Payer: Medicare Other

## 2022-08-01 ENCOUNTER — Ambulatory Visit (INDEPENDENT_AMBULATORY_CARE_PROVIDER_SITE_OTHER): Payer: Medicare Other | Admitting: Internal Medicine

## 2022-08-01 VITALS — BP 130/80 | HR 76 | Temp 98.3°F | Ht 66.0 in | Wt 120.6 lb

## 2022-08-01 DIAGNOSIS — M25562 Pain in left knee: Secondary | ICD-10-CM

## 2022-08-01 DIAGNOSIS — R202 Paresthesia of skin: Secondary | ICD-10-CM

## 2022-08-01 DIAGNOSIS — M47812 Spondylosis without myelopathy or radiculopathy, cervical region: Secondary | ICD-10-CM | POA: Diagnosis not present

## 2022-08-01 DIAGNOSIS — M545 Low back pain, unspecified: Secondary | ICD-10-CM | POA: Diagnosis not present

## 2022-08-01 DIAGNOSIS — G8929 Other chronic pain: Secondary | ICD-10-CM

## 2022-08-01 HISTORY — DX: Spondylosis without myelopathy or radiculopathy, cervical region: M47.812

## 2022-08-01 MED ORDER — METHYLPREDNISOLONE ACETATE 40 MG/ML IJ SUSP
40.0000 mg | Freq: Once | INTRAMUSCULAR | Status: AC
  Administered 2022-08-01: 40 mg via INTRAMUSCULAR

## 2022-08-01 NOTE — Patient Instructions (Addendum)
Ice, heat, Tylenol arthritis  Voltaren gel Aspercream with lidocaine  Call and discuss with ortho appt Friday   Arthritis Arthritis is a term that is commonly used to refer to joint pain or joint disease. There are more than 100 types of arthritis. What are the causes? The most common cause of this condition is wear and tear of a joint. Other causes include: Gout. Inflammation of a joint. An infection of a joint. Sprains and other injuries near the joint. A reaction to medicines or drugs, or an allergic reaction. In some cases, the cause may not be known. What are the signs or symptoms? The main symptom of this condition is pain in the joint during movement. Other symptoms include: Redness, swelling, or stiffness at a joint. Warmth coming from the joint. Fever. Overall feeling of illness. How is this diagnosed? This condition may be diagnosed with a physical exam and tests, including: Blood tests. Urine tests. Imaging tests, such as X-rays, an MRI, or a CT scan. Sometimes, fluid is removed from a joint for testing. How is this treated? This condition may be treated with: Treatment of the cause, if it is known. Rest. Raising (elevating) the joint. Applying cold or hot packs to the joint. Medicines to improve symptoms and reduce inflammation. Injections of a steroid, such as cortisone, into the joint to help reduce pain and inflammation. Depending on the cause of your arthritis, you may need to make lifestyle changes to reduce stress on your joint. Changes may include: Exercising more. Losing weight. Follow these instructions at home: Medicines Take over-the-counter and prescription medicines only as told by your health care provider. Do not take aspirin to relieve pain if your health care provider thinks that gout may be causing your pain. Activity Rest your joint if told by your health care provider. Rest is important when your disease is active and your joint feels  painful, swollen, or stiff. Avoid activities that make the pain worse. Balance activity with rest. Exercise your joint regularly with range-of-motion exercises as told by your health care provider. Try doing low-impact exercise, such as: Swimming. Water aerobics. Biking. Walking. Managing pain, stiffness, and swelling     If directed, put ice on the affected joint. To do this: Put ice in a plastic bag. Place a towel between your skin and the bag. Leave the ice on for 20 minutes, 2-3 times a day. Remove the ice if your skin turns bright red. This is very important. If you cannot feel pain, heat, or cold, you have a greater risk of damage to the area. If your joint is swollen, raise (elevate) it above the level of your heart if directed by your health care provider. If your joint feels stiff in the morning, try taking a warm shower. If directed, apply heat to the affected area as often as told by your health care provider. Use the heat source that your health care provider recommends, such as a moist heat pack or a heating pad. To apply heat: Place a towel between your skin and the heat source. Leave the heat on for 20-30 minutes. Remove the heat if your skin turns bright red. This is especially important if you are unable to feel pain, heat, or cold. You have a greater risk of getting burned. General instructions Maintain a healthy weight. Follow instructions from your health care provider for weight control. Do not use any products that contain nicotine or tobacco. These products include cigarettes, chewing tobacco, and vaping devices, such  as e-cigarettes. If you need help quitting, ask your health care provider. Keep all follow-up visits. This is important. Where to find more information [employer] [facility]: www.niams.SouthExposed.es Contact a health care provider if: The pain gets worse. You have a fever. Get help right away if: You develop severe joint pain, swelling, or  redness. Many joints become painful and swollen. You develop severe back pain. You develop severe weakness in your leg. Summary Arthritis is a term that is commonly used to refer to joint pain or joint disease. There are more than 100 types of arthritis. The most common cause of this condition is wear and tear of a joint. Other causes include gout, inflammation or infection of the joint, sprains, or allergies. Symptoms of this condition include redness, swelling, or stiffness of the joint. Other symptoms include warmth, fever, or feeling ill. This condition is treated with rest, elevation, medicines, and applying cold or hot packs. Follow your health care provider's instructions about medicines, activity, exercises, and other home care treatments. This information is not intended to replace advice given to you by your health care provider. Make sure you discuss any questions you have with your health care provider. Document Revised: 08/22/2021 Document Reviewed: 08/22/2021 Elsevier Patient Education  [place].

## 2022-08-01 NOTE — Progress Notes (Signed)
Administered depo-medrol 40 mg in right deltoid. Pt tolerated well.

## 2022-08-01 NOTE — Progress Notes (Signed)
Chief Complaint  Patient presents with   Neck Pain    Pt has neck pain down to shoulders pain level at 10 started Sunday    F/u with daughter  Neck pain rad to right shoulder and lower back pain 10/10 tried tylenol arthritis pain started before Sunday prior to visit [facility] appt 08/03/22  Prior imaging  IMPRESSION:  Multilevel cervical spine degenerative disc disease, severe at C5-C6 with trace retrolisthesis of C5 on C6. Multilevel facet arthrosis. Specimen Collected: - Last Resulted: - Date: 08/09/16    Still c/o sensation of crawling scalp offered ct head and consider antipsychotic for dementia/anxiety   Review of Systems  Constitutional:  Negative for weight loss.  HENT:  Negative for hearing loss.   Eyes:  Negative for blurred vision.  Respiratory:  Negative for shortness of breath.   Cardiovascular:  Negative for chest pain.  Gastrointestinal:  Negative for abdominal pain and blood in stool.  Genitourinary:  Negative for dysuria.  Musculoskeletal:  Positive for back pain, joint pain and neck pain. Negative for falls.  Skin:  Negative for rash.  Neurological:  Negative for headaches.  Psychiatric/Behavioral:  Negative for depression.    Past Medical History:  Diagnosis Date   Abdominal pain    ANA positive    1:320 08/09/16    Aortic atherosclerosis (HCC)    Arthritis    modreate right hips, C DD severe C5/6, b/l knees patellar enthesophytosis b/l    Bowel habit changes    pressure   Cancer (HCC)    Carotid stenosis    b/l mild 06/06/16   Chronic constipation    Colon polyps    Colon polyps    x2 removal   Depression    Fibromyalgia    Heart disease 2012   Hemorrhoids    History of fainting spells of unknown cause    HLD (hyperlipidemia)    Inflammatory polyps of colon ([place])    Myoclonic jerking    Poor appetite    Psoriasis    Right leg DVT ([place])    fall 09/2020 dx'ed [facility]   Shortness of breath dyspnea    Syncope    summer  2022   Tinnitus    Urinary urgency    UTI (urinary tract infection)    summer 2022   Vaginal polyp    Vitamin D deficiency    Vitamin D deficiency    Past Surgical History:  Procedure Laterality Date   BREAST SURGERY     left breast    ECTOPIC PREGNANCY SURGERY     ENDOMETRIAL BIOPSY     07/23/16 neg bx   fallopian tube removal     HYSTEROSCOPY WITH D & C N/A 07/23/2016   Procedure: DILATATION AND CURETTAGE;  Surgeon: Benjaman Kindler, MD;  Location: [facility] ORS;  Service: Gynecology;  Laterality: N/A;   right foot surgery      TONSILLECTOMY     TUBAL LIGATION     Family History  Problem Relation Age of Onset   Heart disease Mother    Hypertension Mother    Arthritis Mother    Heart disease Father    Hypertension Father    Social History   Socioeconomic History   Marital status: Widowed    Spouse name: Not on file   Number of children: Not on file   Years of education: Not on file   Highest education level: Not on file  Occupational History   Not on file  Tobacco Use   Smoking status: Never   Smokeless tobacco: Never  Substance and Sexual Activity   Alcohol use: No    Alcohol/week: 0.0 standard drinks of alcohol   Drug use: No   Sexual activity: Not Currently  Other Topics Concern   Not on file  Social History Narrative   Lives with daughter [name]    Has 2 daughters    From [place]   65 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 Automatic upper arm check daily   Cholecalciferol (VITAMIN D3) 1000 units CAPS Take 1 capsule by mouth daily.   Coenzyme Q10-Fish Oil-Vit E (CO-Q 10 OMEGA-3 FISH OIL)  CAPS Take 2 capsules by mouth daily.   Tobramycin-Dexamethasone 0.3-0.05 % SUSP Apply 1 drop to eye 2 (two) times daily. X  4-7 days   Allergies  Allergen Reactions   Ciprofloxacin Nausea And Vomiting   Eliquis [Apixaban] Swelling    Tongue swelling and numbness    Fish Allergy Other (See Comments)    Perch Fish   Fish-Derived Products Other (See Comments)    Perch Fish   [employer]    Recent Results (from the past 2160 hour(s))  Comprehensive metabolic panel     Status: None   Collection Time: 06/13/22 10:51 AM  Result Value Ref Range   Sodium 139 135 - 145 mEq/L   Potassium 4.1 3.5 - 5.1 mEq/L   Chloride 106 96 - 112 mEq/L   CO2 25 19 - 32 mEq/L   Glucose, Bld 89 70 - 99 mg/dL   BUN 10 6 - 23 mg/dL   Creatinine, Ser 0.71 0.40 - 1.20 mg/dL   Total Bilirubin 1.0 0.2 - 1.2 mg/dL   Alkaline Phosphatase 97 39 - 117 U/L   AST 23 0 - 37 U/L   ALT 19 0 - 35 U/L   Total Protein 6.4 6.0 - 8.3 g/dL   Albumin 4.0 3.5 - 5.2 g/dL   GFR 72.28 >60.00 mL/min    Comment: Calculated using the CKD-EPI Creatinine Equation (2021)   Calcium 9.0 8.4 - 10.5 mg/dL  Lipid panel     Status: Abnormal   Collection Time: 06/13/22 10:51 AM  Result Value Ref Range   Cholesterol 239 (H) 0 - 200 mg/dL    Comment: ATP III Classification       Desirable:  < 200 mg/dL               Borderline High:  200 - 239 mg/dL          High:  > = 240 mg/dL   Triglycerides 70.0 0.0 - 149.0 mg/dL    Comment: Normal:  <150 mg/dLBorderline High:  150 - 199 mg/dL   HDL 66.80 >39.00 mg/dL   VLDL 14.0 0.0 - 40.0 mg/dL   LDL Cholesterol 159 (H) 0 - 99 mg/dL   Total CHOL/HDL Ratio 4     Comment:                Men          Women1/2 Average Risk  3.4          3.3Average Risk          5.0          4.42X Average Risk          9.6          7.13X Average Risk          15.0          11.0                       NonHDL 172.59     Comment: NOTE:  Non-HDL goal should be 30 mg/dL higher than patient's LDL goal (i.e.  LDL goal of < 70 mg/dL, would have non-HDL goal of < 100 mg/dL)  CBC with Differential/Platelet     Status: None   Collection Time: 06/13/22 10:51 AM  Result Value Ref Range   WBC 6.2 4.0 - 10.5 K/uL   RBC 4.38 3.87 - 5.11 Mil/uL   Hemoglobin 12.9 12.0 - 15.0 g/dL   HCT 38.8 36.0 - 46.0 %   MCV 88.5 78.0 - 100.0 fl   MCHC 33.2 30.0 - 36.0 g/dL   RDW 13.1 11.5 - 15.5 %   Platelets 268.0 150.0 - 400.0 K/uL   Neutrophils Relative % 69.3 43.0 - 77.0 %   Lymphocytes Relative 21.6 12.0 - 46.0 %   Monocytes Relative 7.6 3.0 - 12.0 %   Eosinophils Relative 0.5 0.0 - 5.0 %   Basophils Relative 1.0 0.0 - 3.0 %   Neutro Abs 4.3 1.4 - 7.7 K/uL   Lymphs Abs 1.3 0.7 - 4.0 K/uL   Monocytes Absolute 0.5 0.1 - 1.0 K/uL   Eosinophils Absolute 0.0 0.0 - 0.7 K/uL   Basophils Absolute 0.1 0.0 - 0.1 K/uL  TSH     Status: None   Collection Time: 06/13/22 10:51 AM  Result Value Ref Range   TSH 3.74 0.35 - 5.50 uIU/mL  Vitamin D (25 hydroxy)     Status: None   Collection Time: 06/13/22 10:51 AM  Result Value Ref Range   VITD 47.18 30.00 - 100.00 ng/mL   Objective  Body mass index is 19.47 kg/m. Wt Readings from Last 3 Encounters:  08/01/22 120 lb 9.6 oz (54.7 kg)  07/25/22 122 lb 9.6 oz (55.6 kg)  06/13/22 124 lb 9.6 oz (56.5 kg)   Temp Readings from Last 3 Encounters:  08/01/22 98.3 F (36.8 C) (Oral)  07/25/22 98.6 F (37 C) (Oral)  06/13/22 (!) 95.9 F (35.5 C) (Other (Comment))   BP Readings from Last 3 Encounters:  08/01/22 130/80  06/13/22 116/66  04/03/22 120/80   Pulse Readings from Last 3 Encounters:  08/01/22 76  06/13/22 77  04/03/22 72    Physical Exam Vitals and nursing note reviewed.  Constitutional:      Appearance: Normal appearance. She is well-developed and well-groomed.  HENT:     Head: Normocephalic and atraumatic.  Eyes:     Conjunctiva/sclera: Conjunctivae normal.     Pupils: Pupils are equal, round, and reactive to light.  Cardiovascular:     Rate and  Rhythm: Normal rate and regular rhythm.     Heart sounds: Normal heart sounds. No murmur heard. Pulmonary:     Effort: Pulmonary effort is normal.     Breath sounds: Normal breath sounds.  Abdominal:     General: Abdomen is flat. Bowel sounds are normal.     Tenderness: There is no  abdominal tenderness.  Musculoskeletal:     Cervical back: Tenderness present.       Back:  Skin:    General: Skin is warm and dry.  Neurological:     General: No focal deficit present.     Mental Status: She is alert and oriented to person, place, and time. Mental status is at baseline.     Cranial Nerves: Cranial nerves 2-12 are intact.     Motor: Motor function is intact.     Coordination: Coordination is intact.     Gait: Gait is intact.  Psychiatric:        Attention and Perception: Attention and perception normal.        Mood and Affect: Mood and affect normal.        Speech: Speech normal.        Behavior: Behavior normal. Behavior is cooperative.        Thought Content: Thought content normal.        Cognition and Memory: Cognition and memory normal.        Judgment: Judgment normal.     Assessment  Plan  Cervical arthritis - Plan: DG Cervical Spine Complete, methylPREDNISolone acetate (DEPO-MEDROL) injection 40 mg IMPRESSION: Advanced multilevel cervical disc and facet degeneration. Electronically Signed   By: Logan Bores M.D.   On: 08/01/2022 11:22 Ice, heat, Tylenol arthritis  Voltaren gel Aspercream with lidocaine  Call and discuss with ortho appt Friday 08/03/22 [facility]   Acute bilateral low back pain without sciatica - Plan: DG Lumbar Spine Complete IMPRESSION: 1. No acute findings. 2. Multilevel degenerative disc disease and facet arthropathy. 3. Mild lumbar scoliosis     Electronically Signed   By: Kerby Moors M.D.   On: 08/01/2022 11:23  Sensation of skin crawling  office ct head and consider antipsychotic for dementia/anxiety  HM  See last  visit  Provider: Dr. Olivia Mackie McLean-Scocuzza-[facility]

## 2022-08-06 ENCOUNTER — Ambulatory Visit
Admission: RE | Admit: 2022-08-06 | Discharge: 2022-08-06 | Disposition: A | Discharge status: Home / Self Care | Payer: Medicare Other | Source: Ambulatory Visit | Attending: Internal Medicine | Admitting: Internal Medicine

## 2022-08-06 DIAGNOSIS — W19XXXD Unspecified fall, subsequent encounter: Secondary | ICD-10-CM | POA: Insufficient documentation

## 2022-08-06 DIAGNOSIS — H9313 Tinnitus, bilateral: Secondary | ICD-10-CM | POA: Insufficient documentation

## 2022-08-06 DIAGNOSIS — F0390 Unspecified dementia without behavioral disturbance: Secondary | ICD-10-CM | POA: Insufficient documentation

## 2022-08-06 DIAGNOSIS — R519 Headache, unspecified: Secondary | ICD-10-CM | POA: Insufficient documentation

## 2022-08-06 DIAGNOSIS — R202 Paresthesia of skin: Secondary | ICD-10-CM | POA: Insufficient documentation

## 2022-08-06 DIAGNOSIS — E785 Hyperlipidemia, unspecified: Secondary | ICD-10-CM | POA: Diagnosis not present

## 2022-08-06 DIAGNOSIS — Z8673 Personal history of transient ischemic attack (TIA), and cerebral infarction without residual deficits: Secondary | ICD-10-CM | POA: Insufficient documentation

## 2022-08-06 DIAGNOSIS — I38 Endocarditis, valve unspecified: Secondary | ICD-10-CM

## 2022-08-06 DIAGNOSIS — I6381 Other cerebral infarction due to occlusion or stenosis of small artery: Secondary | ICD-10-CM

## 2022-08-06 DIAGNOSIS — I083 Combined rheumatic disorders of mitral, aortic and tricuspid valves: Secondary | ICD-10-CM | POA: Diagnosis not present

## 2022-08-06 LAB — ECHOCARDIOGRAM COMPLETE
AR max vel: 1.3 cm2
AV Area VTI: 1.37 cm2
AV Area mean vel: 1.29 cm2
AV Mean grad: 4 mmHg
AV Peak grad: 5.7 mmHg
Ao pk vel: 1.19 m/s
Area-P 1/2: 2.52 cm2
S' Lateral: 1.31 cm

## 2022-08-15 NOTE — Addendum Note (Signed)
Addended by: Orland Mustard on: 08/15/2022 05:25 PM   Modules accepted: Orders

## 2022-08-24 ENCOUNTER — Telehealth: Payer: Self-pay | Admitting: Internal Medicine

## 2022-08-24 ENCOUNTER — Other Ambulatory Visit: Payer: Self-pay | Admitting: Internal Medicine

## 2022-08-24 MED ORDER — CELECOXIB 100 MG PO CAPS: 100.0000 mg | ORAL_CAPSULE | Freq: Two times a day (BID) | ORAL | 0 refills | Status: DC

## 2022-08-24 NOTE — Telephone Encounter (Signed)
Pt daughter called stating the pt is in severe pain with arthritis and want the provider to write her a prescription for the pain. Pt daughter would like to be called

## 2022-08-27 NOTE — Telephone Encounter (Signed)
Can we check on ortho referral ?  Thank you

## 2022-08-27 NOTE — Telephone Encounter (Signed)
Pts daughter was informed on the medication [facility] sent in

## 2022-09-04 ENCOUNTER — Telehealth: Payer: Self-pay | Admitting: Internal Medicine

## 2022-09-04 NOTE — Telephone Encounter (Signed)
Is she taking celebrex and does she need something stronger for pain?

## 2022-09-04 NOTE — Telephone Encounter (Signed)
Please advise does pt still need referral to [facility]? Thank you!

## 2022-09-04 NOTE — Telephone Encounter (Signed)
Per last message pt was doing outpatient therapy and cant do both  Is this the case can you call daughter?  Good morning!   I received a call from Ashley Murrain stated that Pt is doing out patient therapy. Pt can't do both per [facility]. Pt advised she wants to continue doing outpatient.   Thank you!

## 2022-09-05 MED ORDER — TRAMADOL HCL 50 MG PO TABS: 50.0000 mg | ORAL_TABLET | Freq: Two times a day (BID) | ORAL | 0 refills | Status: AC | PRN

## 2022-09-05 NOTE — Addendum Note (Signed)
Addended by: Orland Mustard on: 09/05/2022 01:01 PM   Modules accepted: Orders

## 2022-09-05 NOTE — Telephone Encounter (Signed)
Pt daughter has been informed.

## 2022-09-05 NOTE — Telephone Encounter (Signed)
Spoke w/ pts daughter and she stated pt is not taking celebrex and she thinks she does need something stronger. But pt does NOT want anything stronger.

## 2022-09-05 NOTE — Telephone Encounter (Signed)
Ok ive sent tramadol 2x per day if they want to have on hand

## 2022-09-06 ENCOUNTER — Ambulatory Visit (INDEPENDENT_AMBULATORY_CARE_PROVIDER_SITE_OTHER): Payer: Medicare Other | Admitting: Podiatry

## 2022-09-06 DIAGNOSIS — B351 Tinea unguium: Secondary | ICD-10-CM

## 2022-09-06 DIAGNOSIS — D689 Coagulation defect, unspecified: Secondary | ICD-10-CM

## 2022-09-06 DIAGNOSIS — M79674 Pain in right toe(s): Secondary | ICD-10-CM

## 2022-09-06 DIAGNOSIS — M79675 Pain in left toe(s): Secondary | ICD-10-CM | POA: Diagnosis not present

## 2022-09-06 NOTE — Progress Notes (Signed)
Subjective:  Patient ID: Carmen Humphrey, female    DOB: 10/30/1925,  MRN: [id number]  Chief Complaint  Patient presents with   Nail Problem    Nail trim     88 y.o. female returns for the above complaint.  Patient presents with thickened elongated dystrophic toenails x10.  Patient would like to have them debrided down.  She states is painful to touch.  She states she is not able to take care of it herself.  Patient is on a blood thinner.  She denies any secondary complaint today.  She would like a refill on her lidocaine ointment  Objective:   There were no vitals filed for this visit. Podiatric Exam: Vascular: dorsalis pedis and posterior tibial pulses are palpable bilateral. Capillary return is immediate. Temperature gradient is WNL. Skin turgor WNL  Sensorium: Sporadic numbness tingling noted.  Normal sinus monofilament testing noted.  Normal normal tactile sensation noted. Nail Exam: Pt has thick disfigured discolored nails with subungual debris noted bilateral entire nail hallux through fifth toenails Ulcer Exam: There is no evidence of ulcer or pre-ulcerative changes or infection. Orthopedic Exam: Muscle tone and strength are WNL. No limitations in general ROM. No crepitus or effusions noted. HAV  B/L.  Hammer toes 2-5  B/L. Skin: No Porokeratosis. No infection or ulcers.  Mild ingrown noted of the right hallux without any paronychia or clinical signs of infection.  Heloma molle noted to bilateral first and second digit facing sides.  Mild pain on palpation  Assessment & Plan:  Patient was evaluated and treated and all questions answered.  Numbness tingling to both of her feet -X-ray of the patient etiology of numbness tingling various treatment options were extensively discussed.  I believe patient will benefit from lidocaine patch to help with the numbness tingling.  She is already has taken gabapentin and it does not help. -Lidocaine ointment was sent to the pharmacy again.    Bilateral severe bunion deformity with hammertoe contractures of 2 through 5 -The spacer and offloading with shoe gear modification has helped tremendously.  Patient still has some pain but much more manageable.  Patient just seems to continue to have the same discussion without any acute management.  She also has been noncompliant with conservative treatment options including applying toe pressure is appropriately.  Right hallux mild ingrown -I explained to the patient the etiology of ingrown and likely the hallux valgus rotation of the big toe into the second toe is still primary cause of the pain associated with this.  Unfortunately I will hold off on removal given patient's age and the pain associated with this has to do with the biomechanical deformity of the toe.  I believe she will benefit from a toe spacer.  Toe spacer was dispensed.  Bilateral first and second facing surface heloma molle -I explained to the patient the etiology, heloma molle and various treatment options were discussed.  I have encouraged her to continue using toe protectors and spacers at this time.  This is very mild in nature.  Patient states understanding  Onychomycosis with pain  -Nails palliatively debrided as below. -Educated on self-care  Procedure: Nail Debridement Rationale: pain  Type of Debridement: manual, sharp debridement. Instrumentation: Nail nipper, rotary burr. Number of Nails: 10  Procedures and Treatment: Consent by patient was obtained for treatment procedures. The patient understood the discussion of treatment and procedures well. All questions were answered thoroughly reviewed. Debridement of mycotic and hypertrophic toenails, 1 through 5 bilateral and clearing of   subungual debris. No ulceration, no infection noted.  Return Visit-Office Procedure: Patient instructed to return to the office for a follow up visit 3 months for continued evaluation and treatment.  Meadow Abramo, DPM    No  follow-ups on file.      Illeana Edick, DPM    No follow-ups on file. 

## 2022-09-07 DIAGNOSIS — M542 Cervicalgia: Secondary | ICD-10-CM

## 2022-09-07 HISTORY — DX: Cervicalgia: M54.2

## 2022-10-25 ENCOUNTER — Encounter: Payer: Self-pay | Admitting: Family Medicine

## 2022-10-25 ENCOUNTER — Ambulatory Visit (INDEPENDENT_AMBULATORY_CARE_PROVIDER_SITE_OTHER): Payer: Medicare Other | Admitting: Family Medicine

## 2022-10-25 VITALS — BP 118/70 | HR 68 | Temp 97.9°F | Ht 66.0 in | Wt 121.0 lb

## 2022-10-25 DIAGNOSIS — H5789 Other specified disorders of eye and adnexa: Secondary | ICD-10-CM

## 2022-10-25 DIAGNOSIS — F0390 Unspecified dementia without behavioral disturbance: Secondary | ICD-10-CM

## 2022-10-25 DIAGNOSIS — M159 Polyosteoarthritis, unspecified: Secondary | ICD-10-CM | POA: Diagnosis not present

## 2022-10-25 DIAGNOSIS — R634 Abnormal weight loss: Secondary | ICD-10-CM

## 2022-10-25 DIAGNOSIS — M150 Primary generalized (osteo)arthritis: Secondary | ICD-10-CM

## 2022-10-25 NOTE — Progress Notes (Signed)
SUBJECTIVE:   Chief Complaint  Patient presents with   Transitions Of Care   HPI Patient presents to clinic to transfer care  Osteoarthritis Continues to have pain everywhere.  Did not take Celebrex as prescribed due to side effects.  Daughter reports does not like taking medications.  Denies any worsening pain.  Dementia/weight loss Concerned about memory.  Has been seen by neurology and discussed medications.  Daughter reports patient did not want to start medications.  No significant worsening of memory.  Lives with daughter and grandson.  Remains mobile, does not need assistance for bathing, feeding or dressing.  Daughter is primary caregiver she reports that she thinks weight may have decreased.  Reports patient has appetite and eats but her portions have decreased in size.  Currently drinks half of her Ensure daily.  Denies any recent hallucinations or paranoia.  Bilateral eye redness Recently evaluated by ophthalmology.  Eyes dilated and fluorescein was used.  Since that time patient reports has had itching and swelling of the eyes.  Was reevaluated by ophthalmology and started on steroid eyedrops.  Reports has follow-up appointment in 4 days.   PERTINENT PMH / PSH: CAD DVT CVA Dementia Osteoarthritis   OBJECTIVE:  BP 118/70   Pulse 68   Temp 97.9 F (36.6 C) (Oral)   Ht '5\' 6"'$  (1.676 m)   Wt 121 lb (54.9 kg)   SpO2 98%   BMI 19.53 kg/m    Physical Exam Vitals reviewed.  Constitutional:      General: She is not in acute distress.    Appearance: She is not ill-appearing.  HENT:     Head: Normocephalic.     Nose: Nose normal.  Eyes:     General:        Right eye: No discharge.        Left eye: No discharge.     Extraocular Movements:     Right eye: Abnormal extraocular motion present.     Left eye: Abnormal extraocular motion present.     Conjunctiva/sclera:     Right eye: Right conjunctiva is injected. Chemosis present. No exudate or hemorrhage.    Left  eye: Left conjunctiva is injected. Chemosis present. No exudate or hemorrhage. Cardiovascular:     Rate and Rhythm: Normal rate and regular rhythm.     Heart sounds: Normal heart sounds.  Pulmonary:     Effort: Pulmonary effort is normal.     Breath sounds: Normal breath sounds.  Abdominal:     General: Abdomen is flat. Bowel sounds are normal.     Palpations: Abdomen is soft.  Musculoskeletal:        General: Normal range of motion.     Cervical back: Normal range of motion.  Neurological:     Mental Status: She is alert and oriented to person, place, and time. Mental status is at baseline.  Psychiatric:        Mood and Affect: Mood normal.        Behavior: Behavior normal.        Thought Content: Thought content normal.        Judgment: Judgment normal.     ASSESSMENT/PLAN:  Dementia without behavioral disturbance, psychotic disturbance, mood disturbance, or anxiety, unspecified dementia severity, unspecified dementia type ([place]) Assessment & Plan: Not currently on any medications.  Continue to redirect and monitor for worsening symptoms. Encouraged increasing Ensure to increase caloric intake.    Primary osteoarthritis involving multiple joints Assessment & Plan: Not interested  in taking medications for concern of side effects. Discussed taking Tylenol regularly to avoid increase in pain.  Patient to consider and will discuss at next appointment.   Red eyes Assessment & Plan: Suspect reaction to agent is used during eye exam.  Now on prednisone eyedrops.  Currently being followed by ophthalmology and has follow-up appointment scheduled.   Weight loss, non-intentional Assessment & Plan: 11 pounds loss since February 2022.  Recent lab work reviewed and reassuring.  Has appetite but reports eats a little meals.   Recommend increasing Ensure meal replacement with each meal to increase caloric intake. Follow-up in 3 months.     PDMP reviewed  Return in about 3 months  (around 01/15/2023) for PCP.  Carollee Leitz, MD

## 2022-10-25 NOTE — Patient Instructions (Addendum)
It was a pleasure meeting you today. Thank you for allowing me to take part in your health care.  Our goals for today as we discussed include:  For your arthritis Can take Tylenol 500 mg 3 times a day.  I recommend starting off with 1 tablet daily if not effective in 2 to 3 days can increase to 1 tablet 2 times a day.  If needing to can increase to 3 times a day, breakfast, lunch and dinner. Remain active Can use heat/ice as needed  For your eyes Likely this is a reaction to the solution that was used in your eyes at the eye clinic.  Continue to use this prednisone drops as prescribed by the eye doctor and follow-up as scheduled on Monday.   For your weight You have lost 11 pounds since February 2022.  Your most recent lab work was normal.  I would recommend you increase your Ensure meal replacements to 2 times a day.  Drink the full can in the morning and then again in the afternoon. We will follow-up in 3 months and I will plan to do labs at that time.  Recommend pneumonia vaccine.  If you decide you would like to get this vaccine we can discuss again at next visit.   If you have any questions or concerns, please do not hesitate to call the office at [phone].  I look forward to our next visit and until then take care and stay safe.  Regards,   Carollee Leitz, MD   [facility]

## 2022-11-04 ENCOUNTER — Encounter: Payer: Self-pay | Admitting: Family Medicine

## 2022-11-04 DIAGNOSIS — H5789 Other specified disorders of eye and adnexa: Secondary | ICD-10-CM | POA: Insufficient documentation

## 2022-11-04 DIAGNOSIS — R634 Abnormal weight loss: Secondary | ICD-10-CM | POA: Insufficient documentation

## 2022-11-04 NOTE — Assessment & Plan Note (Signed)
11 pounds loss since February 2022.  Recent lab work reviewed and reassuring.  Has appetite but reports eats a little meals.   Recommend increasing Ensure meal replacement with each meal to increase caloric intake. Follow-up in 3 months.

## 2022-11-04 NOTE — Assessment & Plan Note (Signed)
Not currently on any medications.  Continue to redirect and monitor for worsening symptoms. Encouraged increasing Ensure to increase caloric intake.

## 2022-11-04 NOTE — Assessment & Plan Note (Signed)
Not interested in taking medications for concern of side effects. Discussed taking Tylenol regularly to avoid increase in pain.  Patient to consider and will discuss at next appointment.

## 2022-11-04 NOTE — Assessment & Plan Note (Signed)
Suspect reaction to agent is used during eye exam.  Now on prednisone eyedrops.  Currently being followed by ophthalmology and has follow-up appointment scheduled.

## 2022-11-04 NOTE — Assessment & Plan Note (Signed)
Patient not interested in statin therapy.

## 2022-11-04 NOTE — Assessment & Plan Note (Deleted)
Not interested in taking medications for concern of side effects. Discussed taking Tylenol regularly to avoid increase in pain.  Patient to consider and will discuss at next appointment.

## 2022-11-04 NOTE — Assessment & Plan Note (Deleted)
Not interested in statin therapy

## 2022-11-07 ENCOUNTER — Encounter: Payer: Self-pay | Admitting: Family Medicine

## 2022-11-07 ENCOUNTER — Ambulatory Visit (INDEPENDENT_AMBULATORY_CARE_PROVIDER_SITE_OTHER): Payer: Medicare Other | Admitting: Family Medicine

## 2022-11-07 VITALS — BP 114/68 | HR 78 | Temp 97.7°F | Ht 66.0 in | Wt 124.5 lb

## 2022-11-07 DIAGNOSIS — R040 Epistaxis: Secondary | ICD-10-CM

## 2022-11-07 DIAGNOSIS — I6381 Other cerebral infarction due to occlusion or stenosis of small artery: Secondary | ICD-10-CM | POA: Diagnosis not present

## 2022-11-07 NOTE — Assessment & Plan Note (Signed)
Episode on Monday (less than 30 min) that stopped on its own, left nostril No blood thinners and no head/face trauma  Reassuring exam with scab on L nostril on septum today   Inst to avoid trauma to area incl aggressive nose blowing Try tiny amt of aquaphor or vaseline once daily to help heal for a week Consider humidifier in bedroom  If this reoccurs Inst to pinch firmly for at least 10 minutes  Do not lean head back  If over 30 min go to ER or call 911  Reassuring vitals today

## 2022-11-07 NOTE — Patient Instructions (Addendum)
There is a scab on the left septum in the nose- this is what caused the nose bleed  Use a tiny spot of vaseline or aquaphor at bedtime to keep this from bleeding and let is again  Try not to blow nose hard or pick or scratch   You can also use a vaporizer /humidifier in the bedroom to keep air from being dry   In the event of a nosebleed Pinch firmly and do not let go for at least 10 minutes Do not lie your head back   If you cannot stop it- try the afrin  Then if not improving in 30 minutes- go to the ER or call 911

## 2022-11-07 NOTE — Progress Notes (Signed)
Subjective:    Patient ID: Carmen Humphrey, female    DOB: May 25, 1925, 88 y.o.   MRN: [id number]  HPI 88 yo pt of Dr Volanda Napoleon presents for nosebleed and scalp discomfort  She has a h/o dementia and stroke in the past   Wt Readings from Last 3 Encounters:  11/07/22 124 lb 8 oz (56.5 kg)  10/25/22 121 lb (54.9 kg)  08/01/22 120 lb 9.6 oz (54.7 kg)   20.09 kg/m  Had scalp discomfort  Felt like something was moving in her scalp - (for years)   Monday night  Was on her way to the bathroom  Mod to severe bleeding from nose - landed on counter top and floor and toilet   The amt of blood scared her and family  EMT came and they got it to stop  Was less than 30 minutes  Perhaps more from L nostril  Refused to go to the ER   BP Readings from Last 3 Encounters:  11/07/22 114/68  10/25/22 118/70  08/01/22 130/80   Pulse Readings from Last 3 Encounters:  11/07/22 78  10/25/22 68  08/01/22 76   She does not take blood thinners  No longer on fish oil   Lab Results  Component Value Date   WBC 6.2 06/13/2022   HGB 12.9 06/13/2022   HCT 38.8 06/13/2022   MCV 88.5 06/13/2022   PLT 268.0 06/13/2022   Patient Active Problem List   Diagnosis Date Noted   Epistaxis 11/07/2022   Red eyes 11/04/2022   Weight loss, non-intentional 11/04/2022   DDD (degenerative disc disease), cervical 04/03/2022   Lacunar infarction ([place]) 12/12/2021   Dementia without behavioral disturbance, psychotic disturbance, mood disturbance, or anxiety ([place]) 12/12/2021   Abnormal gait 01/10/2021   Tinnitus of both ears 11/09/2020   Dyslipidemia 08/15/2015   Myalgia and myositis 08/12/2015   Coronary atherosclerosis of native coronary artery 03/22/2015   Vitamin D deficiency 03/22/2015   Osteoarthritis 03/22/2015   Past Medical History:  Diagnosis Date   Abdominal pain    Abnormal thyroid function test 11/07/2020   Acquired claw toe of right foot 07/18/2015   ANA positive    1:320 08/09/16     Aortic atherosclerosis (HCC)    Arthritis    modreate right hips, C DD severe C5/6, b/l knees patellar enthesophytosis b/l    Arthritis 08/12/2015   Arthritis of hand, left 03/22/2015   Arthritis of lumbar spine 02/13/2022   Arthritis of right hip 04/03/2022   Arthritis, lumbar spine 04/03/2022   At risk for falls 01/10/2021   Atherosclerotic heart disease of native coronary artery without angina pectoris 03/22/2015   Formatting of this note might be different from the original.  Last Assessment & Plan:   Referred to [facility] for history of CAD presumed by history of Ranexa prescribed by former ardiologist in [place]. And  Ongoing reports of atypical C/P.  Imdur was prescribed axcording to [facility]' note,  ECHO was normal except for mild valvular abnormalities.  Patient does not appear to be  taking the Idur.   Blood pressure elevated without history of HTN 03/22/2015   Bowel habit changes    pressure   Cancer (HCC)    Carotid stenosis    b/l mild 06/06/16   Cervical arthritis 08/01/2022   Chest pain with low risk for cardiac etiology 03/25/2015   Chronic constipation    Cognitive and neurobehavioral dysfunction 03/22/2015   Colon polyps    Colon polyps  x2 removal   Constipation 04/27/2015   Depression    Elevated blood-pressure reading without diagnosis of hypertension 03/22/2015   Formatting of this note might be different from the original.  Last Assessment & Plan:   Her elevation is mild.  No treatment today .   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    Palpitations 08/12/2015   Note: heart, years ago resolved saw cardiologist in [place]   Pelvic pain in female 09/12/2018   Poor appetite    Psoriasis    Right leg DVT ([place])    fall 09/2020 dx'ed [facility]   Right ovarian cyst 09/12/2018   Shortness of breath dyspnea    Spider veins 10/11/2020   Surgery follow-up examination 12/10/2019   Syncope    summer 2022   Thickened endometrium 09/12/2018   Tinnitus    Unknown if patient has history of gastrointestinal disorders 08/12/2015   Note: hemorrhoids, polyps   Urinary urgency    UTI (urinary tract infection)    summer 2022   Vaginal polyp    Varicose veins of both lower extremities 10/11/2020   Vitamin D deficiency    Vitamin D deficiency    Past Surgical History:  Procedure Laterality Date   BREAST SURGERY     left breast    ECTOPIC PREGNANCY SURGERY     ENDOMETRIAL BIOPSY     07/23/16 neg bx   fallopian tube removal     HYSTEROSCOPY WITH D & C N/A 07/23/2016   Procedure: DILATATION AND CURETTAGE;  Surgeon: Benjaman Kindler, MD;  Location: [facility] ORS;  Service: Gynecology;  Laterality: N/A;   right foot surgery      TONSILLECTOMY     TUBAL LIGATION     Social History   Tobacco Use   Smoking status: Never   Smokeless tobacco: Never  Substance Use Topics   Alcohol use: No    Alcohol/week: 0.0 standard drinks of alcohol   Drug use: No   Family History  Problem Relation Age of Onset   Heart  disease Mother    Hypertension Mother    Arthritis Mother    Heart disease Father    Hypertension Father    Allergies  Allergen Reactions   Ciprofloxacin Nausea And Vomiting   Eliquis [Apixaban] Swelling    Tongue swelling and numbness    Fish Allergy Other (See Comments)    Perch Fish   Fish-Derived  Products Other (See Comments)    Perch Fish   Shellfish-Derived Products    Current Outpatient Medications on File Prior to Visit  Medication Sig Dispense Refill   Blood Pressure KIT 1 kit by Does not apply route daily. Automatic upper arm check daily 1 kit 0   Cholecalciferol (VITAMIN D3) 1000 units CAPS Take 1 capsule by mouth daily.     No current facility-administered medications on file prior to visit.     Review of Systems  Constitutional:  Negative for activity change, appetite change, fatigue, fever and unexpected weight change.  HENT:  Positive for hearing loss and nosebleeds. Negative for congestion, ear pain, facial swelling, mouth sores, rhinorrhea, sinus pressure, sore throat, tinnitus, trouble swallowing and voice change.   Eyes:  Negative for pain, redness and visual disturbance.  Respiratory:  Negative for cough, shortness of breath and wheezing.   Cardiovascular:  Negative for chest pain and palpitations.  Gastrointestinal:  Negative for abdominal pain, blood in stool, constipation and diarrhea.  Endocrine: Negative for polydipsia and polyuria.  Genitourinary:  Negative for dysuria, frequency and urgency.  Musculoskeletal:  Negative for arthralgias, back pain and myalgias.  Skin:  Negative for pallor and rash.  Allergic/Immunologic: Negative for environmental allergies.  Neurological:  Negative for dizziness, syncope and headaches.  Hematological:  Negative for adenopathy. Does not bruise/bleed easily.  Psychiatric/Behavioral:  Negative for decreased concentration and dysphoric mood. The patient is not nervous/anxious.        Objective:   Physical  Exam Constitutional:      General: She is not in acute distress.    Appearance: Normal appearance. She is normal weight. She is not ill-appearing or diaphoretic.  HENT:     Head: Normocephalic and atraumatic.     Right Ear: Tympanic membrane and ear canal normal.     Left Ear: Tympanic membrane and ear canal normal.     Nose: No congestion or rhinorrhea.     Comments: 2-3 mm scab on L septum  No active bleeding    Mouth/Throat:     Pharynx: No oropharyngeal exudate or posterior oropharyngeal erythema.  Eyes:     General: No scleral icterus.       Right eye: No discharge.        Left eye: No discharge.     Conjunctiva/sclera: Conjunctivae normal.     Pupils: Pupils are equal, round, and reactive to light.     Comments: Lower edge of lash line is erythematous  Per chart this is her baseline No pallor  Cardiovascular:     Rate and Rhythm: Normal rate and regular rhythm.  Musculoskeletal:     Cervical back: Neck supple.  Lymphadenopathy:     Cervical: No cervical adenopathy.  Skin:    General: Skin is warm and dry.     Findings: No erythema or rash.  Neurological:     Mental Status: She is alert.     Cranial Nerves: No cranial nerve deficit.  Psychiatric:        Mood and Affect: Mood normal.        Cognition and Memory: She exhibits impaired recent memory and impaired remote memory.     Comments: Baseline dementia   Pt talks about crawling sensation in her skull, like bugs  Not agitated   Helpful family present           Assessment & Plan:   Problem List Items Addressed This Visit       Other  Epistaxis - Primary    Episode on Monday (less than 30 min) that stopped on its own, left nostril No blood thinners and no head/face trauma  Reassuring exam with scab on L nostril on septum today   Inst to avoid trauma to area incl aggressive nose blowing Try tiny amt of aquaphor or vaseline once daily to help heal for a week Consider humidifier in bedroom  If this  reoccurs Inst to pinch firmly for at least 10 minutes  Do not lean head back  If over 30 min go to ER or call 911  Reassuring vitals today

## 2022-12-06 ENCOUNTER — Ambulatory Visit: Payer: Medicare Other | Admitting: Podiatry

## 2022-12-07 ENCOUNTER — Ambulatory Visit: Payer: Medicare HMO | Admitting: Podiatry

## 2022-12-07 DIAGNOSIS — M79674 Pain in right toe(s): Secondary | ICD-10-CM | POA: Diagnosis not present

## 2022-12-07 DIAGNOSIS — M79675 Pain in left toe(s): Secondary | ICD-10-CM | POA: Diagnosis not present

## 2022-12-07 DIAGNOSIS — B351 Tinea unguium: Secondary | ICD-10-CM

## 2022-12-07 NOTE — Progress Notes (Signed)
Subjective:  Patient ID: Carmen Humphrey, female    DOB: 1924-12-24,  MRN: [id number]  Chief Complaint  Patient presents with   Nail Problem    Nail trim     89 y.o. female returns for the above complaint.  Patient presents with thickened elongated dystrophic toenails x10.  Patient would like to have them debrided down.  She states is painful to touch.  She states she is not able to take care of it herself.  Patient is on a blood thinner.  She denies any secondary complaint today.  She would like a refill on her lidocaine ointment  Objective:   There were no vitals filed for this visit. Podiatric Exam: Vascular: dorsalis pedis and posterior tibial pulses are palpable bilateral. Capillary return is immediate. Temperature gradient is WNL. Skin turgor WNL  Sensorium: Sporadic numbness tingling noted.  Normal sinus monofilament testing noted.  Normal normal tactile sensation noted. Nail Exam: Pt has thick disfigured discolored nails with subungual debris noted bilateral entire nail hallux through fifth toenails Ulcer Exam: There is no evidence of ulcer or pre-ulcerative changes or infection. Orthopedic Exam: Muscle tone and strength are WNL. No limitations in general ROM. No crepitus or effusions noted. HAV  B/L.  Hammer toes 2-5  B/L. Skin: No Porokeratosis. No infection or ulcers.  Mild ingrown noted of the right hallux without any paronychia or clinical signs of infection.  Heloma molle noted to bilateral first and second digit facing sides.  Mild pain on palpation  Assessment & Plan:  Patient was evaluated and treated and all questions answered.  Numbness tingling to both of her feet -X-ray of the patient etiology of numbness tingling various treatment options were extensively discussed.  I believe patient will benefit from lidocaine patch to help with the numbness tingling.  She is already has taken gabapentin and it does not help. -Lidocaine ointment was sent to the pharmacy again.    Bilateral severe bunion deformity with hammertoe contractures of 2 through 5 -The spacer and offloading with shoe gear modification has helped tremendously.  Patient still has some pain but much more manageable.  Patient just seems to continue to have the same discussion without any acute management.  She also has been noncompliant with conservative treatment options including applying toe pressure is appropriately.  Right hallux mild ingrown -I explained to the patient the etiology of ingrown and likely the hallux valgus rotation of the big toe into the second toe is still primary cause of the pain associated with this.  Unfortunately I will hold off on removal given patient's age and the pain associated with this has to do with the biomechanical deformity of the toe.  I believe she will benefit from a toe spacer.  Toe spacer was dispensed.  Bilateral first and second facing surface heloma molle -I explained to the patient the etiology, heloma molle and various treatment options were discussed.  I have encouraged her to continue using toe protectors and spacers at this time.  This is very mild in nature.  Patient states understanding  Onychomycosis with pain  -Nails palliatively debrided as below. -Educated on self-care  Procedure: Nail Debridement Rationale: pain  Type of Debridement: manual, sharp debridement. Instrumentation: Nail nipper, rotary burr. Number of Nails: 10  Procedures and Treatment: Consent by patient was obtained for treatment procedures. The patient understood the discussion of treatment and procedures well. All questions were answered thoroughly reviewed. Debridement of mycotic and hypertrophic toenails, 1 through 5 bilateral and clearing of  subungual debris. No ulceration, no infection noted.  Return Visit-Office Procedure: Patient instructed to return to the office for a follow up visit 3 months for continued evaluation and treatment.  Boneta Lucks, DPM    No  follow-ups on file.      Boneta Lucks, DPM    No follow-ups on file.

## 2023-01-01 DIAGNOSIS — W19XXXA Unspecified fall, initial encounter: Secondary | ICD-10-CM | POA: Insufficient documentation

## 2023-01-01 DIAGNOSIS — Y92009 Unspecified place in unspecified non-institutional (private) residence as the place of occurrence of the external cause: Secondary | ICD-10-CM | POA: Insufficient documentation

## 2023-01-15 ENCOUNTER — Ambulatory Visit: Payer: Medicare Other | Admitting: Family Medicine

## 2023-04-02 ENCOUNTER — Encounter: Payer: Self-pay | Admitting: Podiatry

## 2023-04-02 ENCOUNTER — Ambulatory Visit: Payer: Medicare HMO | Admitting: Podiatry

## 2023-04-02 DIAGNOSIS — B351 Tinea unguium: Secondary | ICD-10-CM

## 2023-04-02 DIAGNOSIS — D689 Coagulation defect, unspecified: Secondary | ICD-10-CM | POA: Diagnosis not present

## 2023-04-02 DIAGNOSIS — M79675 Pain in left toe(s): Secondary | ICD-10-CM | POA: Diagnosis not present

## 2023-04-02 DIAGNOSIS — M79674 Pain in right toe(s): Secondary | ICD-10-CM

## 2023-04-02 NOTE — Progress Notes (Signed)
Subjective:  Patient ID: Carmen Humphrey, female    DOB: 10/30/1925,  MRN: [id number]  Chief Complaint  Patient presents with   Nail Problem    Nail trim     89 y.o. female returns for the above complaint.  Patient presents with thickened elongated dystrophic toenails x10.  Patient would like to have them debrided down.  She states is painful to touch.  She states she is not able to take care of it herself.  Patient is on a blood thinner.  She denies any secondary complaint today.  She would like a refill on her lidocaine ointment  Objective:   There were no vitals filed for this visit. Podiatric Exam: Vascular: dorsalis pedis and posterior tibial pulses are palpable bilateral. Capillary return is immediate. Temperature gradient is WNL. Skin turgor WNL  Sensorium: Sporadic numbness tingling noted.  Normal sinus monofilament testing noted.  Normal normal tactile sensation noted. Nail Exam: Pt has thick disfigured discolored nails with subungual debris noted bilateral entire nail hallux through fifth toenails Ulcer Exam: There is no evidence of ulcer or pre-ulcerative changes or infection. Orthopedic Exam: Muscle tone and strength are WNL. No limitations in general ROM. No crepitus or effusions noted. HAV  B/L.  Hammer toes 2-5  B/L. Skin: No Porokeratosis. No infection or ulcers.  Mild ingrown noted of the right hallux without any paronychia or clinical signs of infection.  Heloma molle noted to bilateral first and second digit facing sides.  Mild pain on palpation  Assessment & Plan:  Patient was evaluated and treated and all questions answered.  Numbness tingling to both of her feet -X-ray of the patient etiology of numbness tingling various treatment options were extensively discussed.  I believe patient will benefit from lidocaine patch to help with the numbness tingling.  She is already has taken gabapentin and it does not help. -Lidocaine ointment was sent to the pharmacy again.    Bilateral severe bunion deformity with hammertoe contractures of 2 through 5 -The spacer and offloading with shoe gear modification has helped tremendously.  Patient still has some pain but much more manageable.  Patient just seems to continue to have the same discussion without any acute management.  She also has been noncompliant with conservative treatment options including applying toe pressure is appropriately.  Right hallux mild ingrown -I explained to the patient the etiology of ingrown and likely the hallux valgus rotation of the big toe into the second toe is still primary cause of the pain associated with this.  Unfortunately I will hold off on removal given patient's age and the pain associated with this has to do with the biomechanical deformity of the toe.  I believe she will benefit from a toe spacer.  Toe spacer was dispensed.  Bilateral first and second facing surface heloma molle -I explained to the patient the etiology, heloma molle and various treatment options were discussed.  I have encouraged her to continue using toe protectors and spacers at this time.  This is very mild in nature.  Patient states understanding  Onychomycosis with pain  -Nails palliatively debrided as below. -Educated on self-care  Procedure: Nail Debridement Rationale: pain  Type of Debridement: manual, sharp debridement. Instrumentation: Nail nipper, rotary burr. Number of Nails: 10  Procedures and Treatment: Consent by patient was obtained for treatment procedures. The patient understood the discussion of treatment and procedures well. All questions were answered thoroughly reviewed. Debridement of mycotic and hypertrophic toenails, 1 through 5 bilateral and clearing of   subungual debris. No ulceration, no infection noted.  Return Visit-Office Procedure: Patient instructed to return to the office for a follow up visit 3 months for continued evaluation and treatment.  Nicholes Rough, DPM

## 2023-05-20 ENCOUNTER — Ambulatory Visit (INDEPENDENT_AMBULATORY_CARE_PROVIDER_SITE_OTHER): Payer: Medicare HMO | Admitting: Family Medicine

## 2023-05-20 ENCOUNTER — Encounter: Payer: Self-pay | Admitting: Family Medicine

## 2023-05-20 VITALS — BP 124/78 | HR 75 | Temp 98.5°F | Ht 66.0 in | Wt 124.6 lb

## 2023-05-20 DIAGNOSIS — M25561 Pain in right knee: Secondary | ICD-10-CM

## 2023-05-20 DIAGNOSIS — G8929 Other chronic pain: Secondary | ICD-10-CM

## 2023-05-20 NOTE — Progress Notes (Signed)
SUBJECTIVE:   Chief Complaint  Patient presents with   Knee Pain    Right knee and neck pain   HPI Patient presents to clinic with her daughter.  Concern for right knee pain/ neck.  Patient with severe cognitive decline  Right knee pain/Neck pain Pain ongoing for years.  No recent trauma or falls.  Denies any fevers, numbness tingling or weakness.  Previously seen by Dr. Odis Luster at [facility] for same concerns.  Recommended NSAIDs, PT.  No surgical intervention.  Consider IA injections in future.  She reports there have not been any significant changes in symptoms. Does not recall Ortho recommendations and has not follow up as advised with NP.    PERTINENT PMH / PSH: OA Cervical Neck, DDD Dementia  OBJECTIVE:  BP 124/78   Pulse 75   Temp 98.5 F (36.9 C)   Ht 5\' 6"  (1.676 m)   Wt 124 lb 9.6 oz (56.5 kg)   SpO2 98%   BMI 20.11 kg/m    Physical Exam Vitals reviewed.  Constitutional:      General: She is not in acute distress.    Appearance: Normal appearance. She is normal weight. She is not ill-appearing, toxic-appearing or diaphoretic.  HENT:     Head: Normocephalic and atraumatic.  Eyes:     General:        Right eye: No discharge.        Left eye: No discharge.     Conjunctiva/sclera: Conjunctivae normal.  Cardiovascular:     Rate and Rhythm: Normal rate.  Pulmonary:     Effort: Pulmonary effort is normal.  Musculoskeletal:        General: Swelling present. Normal range of motion.     Cervical back: Normal range of motion and neck supple. No erythema, rigidity, torticollis, tenderness or crepitus. No pain with movement, spinous process tenderness or muscular tenderness. Normal range of motion.     Right knee: Effusion and crepitus present. No erythema. Normal range of motion. Tenderness present. No medial joint line or lateral joint line tenderness. No LCL laxity, MCL laxity, ACL laxity or PCL laxity.     Right lower leg: No edema.     Left lower leg: No  edema.  Skin:    General: Skin is warm and dry.  Neurological:     Mental Status: She is alert. Mental status is at baseline.  Psychiatric:        Behavior: Behavior is cooperative.        Cognition and Memory: Memory is impaired.        10/25/2022    8:04 AM 06/13/2022    9:56 AM 06/13/2022    9:53 AM 06/13/2022    9:52 AM 04/03/2022    1:23 PM  Depression screen PHQ 2/9  Decreased Interest 0 3 3 3 1   Down, Depressed, Hopeless 0 3 3 3  0  PHQ - 2 Score 0 6 6 6 1   Altered sleeping  3 3    Tired, decreased energy  3 3    Change in appetite  3 3    Feeling bad or failure about yourself   1 1    Trouble concentrating  1 1    Moving slowly or fidgety/restless  0 0    Suicidal thoughts  0 0    PHQ-9 Score  17 17    Difficult doing work/chores  Not difficult at all Not difficult at all  10/07/2020   10:54 AM  GAD 7 : Generalized Anxiety Score  Nervous, Anxious, on Edge 0  Control/stop worrying 0  Worry too much - different things 0  Trouble relaxing 0  Restless 0  Easily annoyed or irritable 0  Afraid - awful might happen 0  Total GAD 7 Score 0  Anxiety Difficulty Not difficult at all    ASSESSMENT/PLAN:  Chronic pain of right knee Assessment & Plan: Chronic issue.  No joint instability.  Crepitus on exam Right medial effusion Low suspicion for infectious etiology given chronicity of symptoms, no fevers or point of entry.  Do not suspect hemarthrosis given no injury and not on anticoagulation. Recommend she follow up with Ortho for possible IA injection Does not like to take medication Recommend Tylenol 500 mg TID  Recommend Ibuprofen 400 mg TID prn Heat/Ice as needed Diclofenac gel q4h as needed Offered knee xray, declined as will follow up with Ortho    PDMP reviewed  Return if symptoms worsen or fail to improve.  Dana Allan, MD

## 2023-05-20 NOTE — Patient Instructions (Addendum)
It was a pleasure meeting you today. Thank you for allowing me to take part in your health care.  Our goals for today as we discussed include:  Follow up with orthopedics Call to schedule appointment with Dr Odis Luster.  Last seen 07/2022.  Recommended injections at that time.  Can use Diclofenac gel 4 times a day for knee pain Tylenol 500 mg three times a day    If you have any questions or concerns, please do not hesitate to call the office at [phone].  I look forward to our next visit and until then take care and stay safe.  Regards,   Dana Allan, MD   [facility]

## 2023-05-26 ENCOUNTER — Encounter: Payer: Self-pay | Admitting: Family Medicine

## 2023-05-26 DIAGNOSIS — G8929 Other chronic pain: Secondary | ICD-10-CM | POA: Insufficient documentation

## 2023-05-26 NOTE — Assessment & Plan Note (Addendum)
Chronic issue.  No joint instability.  Crepitus on exam Right medial effusion Low suspicion for infectious etiology given chronicity of symptoms, no fevers or point of entry.  Do not suspect hemarthrosis given no injury and not on anticoagulation. Recommend she follow up with Ortho for possible IA injection Does not like to take medication Recommend Tylenol 500 mg TID  Recommend Ibuprofen 400 mg TID prn Heat/Ice as needed Diclofenac gel q4h as needed Offered knee xray, declined as will follow up with Ortho

## 2023-07-04 ENCOUNTER — Encounter: Payer: Self-pay | Admitting: Podiatry

## 2023-07-04 ENCOUNTER — Ambulatory Visit: Payer: Medicare HMO | Admitting: Podiatry

## 2023-07-04 DIAGNOSIS — B351 Tinea unguium: Secondary | ICD-10-CM

## 2023-07-04 DIAGNOSIS — M79674 Pain in right toe(s): Secondary | ICD-10-CM | POA: Diagnosis not present

## 2023-07-04 DIAGNOSIS — M79675 Pain in left toe(s): Secondary | ICD-10-CM

## 2023-07-04 NOTE — Progress Notes (Signed)
  Subjective:  Patient ID: Carmen Humphrey, female    DOB: 1925-01-02,  MRN: [id number]  Carmen Humphrey presents to clinic today for: painful elongated mycotic toenails 1-5 bilaterally which are tender when wearing enclosed shoe gear. Pain is relieved with periodic professional debridement.   PCP is Dana Allan, MD.  Allergies  Allergen Reactions   Ciprofloxacin Nausea And Vomiting   Eliquis [Apixaban] Swelling    Tongue swelling and numbness    Fish Allergy Other (See Comments)    Perch Fish   Fish-Derived Products Other (See Comments)    Perch Fish   Shellfish-Derived Products     Review of Systems: Negative except as noted in the HPI.  Objective: No changes noted in today's physical examination. There were no vitals filed for this visit.  Carmen Humphrey is a pleasant 89 y.o. female WD, WN in NAD. AAO x 3.  Vascular Examination: Capillary refill time <3 seconds b/l LE. Palpable pedal pulses b/l LE. Digital hair present b/l. No pedal edema b/l. Skin temperature gradient WNL b/l.  Varicosities present b/l.[name]  Dermatological Examination: Pedal skin with normal turgor, texture and tone b/l. No open wounds. No interdigital macerations b/l. Toenails 1-5 b/l thickened, discolored, dystrophic with subungual debris. There is pain on palpation to dorsal aspect of nailplates. .  Neurological Examination: Protective sensation intact with 10 gram monofilament b/l LE. Vibratory sensation intact b/l LE.   Musculoskeletal Examination: Muscle strength 5/5 to all lower extremity muscle groups bilaterally. Unsteady gait and is assisted by her daughter on today's visit.[name] HAV with bunion bilaterally and hammertoes 2-5 b/l.  Assessment/Plan: 1. Pain due to onychomycosis of toenails of both feet     -Patient's family member present. All questions/concerns addressed on today's visit. -Consent given for treatment as described below: -Examined patient. -Counseled patient on need to use cane or  walker for gait stability and to avoid falls. -Shoe recommendations advised for [employer]. Daughter states patient has been told on previous occasions, but continues to wear her Keds canvas sneakers. I informed patient wider shoes would better accommodate her bunions and hammertoes. -Dispensed tube foam for right great toe to prevent rubbing and wound development of right 2nd toe. Apply every morning. Remove every evening. -Patient/POA to call should there be question/concern in the interim.   Return in about 3 months (around 10/04/2023).  Carmen Humphrey, DPM

## 2023-07-24 ENCOUNTER — Ambulatory Visit (INDEPENDENT_AMBULATORY_CARE_PROVIDER_SITE_OTHER): Payer: Medicare HMO | Admitting: *Deleted

## 2023-07-24 VITALS — BP 120/64 | HR 70 | Temp 97.1°F | Ht 66.0 in | Wt 123.0 lb

## 2023-07-24 DIAGNOSIS — Z0000 Encounter for general adult medical examination without abnormal findings: Secondary | ICD-10-CM

## 2023-07-24 NOTE — Progress Notes (Signed)
Subjective:   Carmen Humphrey is a 89 y.o. female who presents for an Initial Medicare Annual Wellness Visit.  Visit Complete: In person  Vital signs taken and documented  Review of Systems     Cardiac Risk Factors include: advanced age (>96men, >68 women);dyslipidemia     Objective:    Today's Vitals   07/24/23 0856 07/24/23 0859  BP: 120/64   Pulse: 70   Temp: (!) 97.1 F (36.2 C)   TempSrc: Skin   Weight: 123 lb (55.8 kg)   Height: 5\' 6"  (1.676 m)   PainSc:  4    Body mass index is 19.85 kg/m.     07/24/2023    9:26 AM 11/30/2019    6:23 PM 12/06/2017   11:01 AM 12/05/2017    3:54 PM 07/23/2016    1:53 PM 07/19/2016    2:04 PM  Advanced Directives  Does Patient Have a Medical Advance Directive? No;Yes Yes Yes Yes No Yes  Type of Clinical research associate Healthcare Power of Attorney  Living will  Does patient want to make changes to medical advance directive?    No - Patient declined    Copy of Healthcare Power of Attorney in Chart?    No - copy requested    Would patient like information on creating a medical advance directive? Yes (MAU/Ambulatory/Procedural Areas - Information given)   No - Patient declined No - patient declined information     Current Medications (verified) Outpatient Encounter Medications as of 07/24/2023  Medication Sig   Blood Pressure KIT 1 kit by Does not apply route daily. Automatic upper arm check daily   Cholecalciferol (VITAMIN D3) 1000 units CAPS Take 1 capsule by mouth daily.   Fish Oil-Cholecalciferol (OMEGA-3 + D PO) Take by mouth daily.   Propylene Glycol (SYSTANE COMPLETE OP) Apply to eye daily as needed.   No facility-administered encounter medications on file as of 07/24/2023.    Allergies (verified) Ciprofloxacin, Eliquis [apixaban], Fish allergy, Fish-derived products, and Shellfish-derived products   History: Past Medical History:  Diagnosis Date   Abdominal pain     Abnormal thyroid function test 11/07/2020   Acquired claw toe of right foot 07/18/2015   ANA positive    1:320 08/09/16    Aortic atherosclerosis (HCC)    Arthritis    modreate right hips, C DD severe C5/6, b/l knees patellar enthesophytosis b/l    Arthritis 08/12/2015   Arthritis of hand, left 03/22/2015   Arthritis of lumbar spine 02/13/2022   Arthritis of right hip 04/03/2022   Arthritis, lumbar spine 04/03/2022   At risk for falls 01/10/2021   Atherosclerotic heart disease of native coronary artery without angina pectoris 03/22/2015   Formatting of this note might be different from the original.  Last Assessment & Plan:   Referred to [facility] for history of CAD presumed by history of Ranexa prescribed by former ardiologist in [place]. And  Ongoing reports of atypical C/P.  Imdur was prescribed axcording to [facility]' note,  ECHO was normal except for mild valvular abnormalities.  Patient does not appear to be  taking the Idur.   Blood pressure elevated without history of HTN 03/22/2015   Bowel habit changes    pressure   Cancer (HCC)    Carotid stenosis    b/l mild 06/06/16   Cervical arthritis 08/01/2022   Chest pain with low risk for cardiac etiology 03/25/2015   Chronic constipation    Cognitive  and neurobehavioral dysfunction 03/22/2015   Colon polyps    Colon polyps    x2 removal   Constipation 04/27/2015   Depression    Elevated blood-pressure reading without diagnosis of hypertension 03/22/2015   Formatting of this note might be different from the original.  Last Assessment & Plan:   Her elevation is mild.  No treatment today .   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    Palpitations 08/12/2015   Note: heart, years ago resolved saw cardiologist in [place]   Pelvic pain in female 09/12/2018   Poor appetite    Psoriasis    Right leg DVT (HCC)    fall 09/2020 dx'ed [facility]   Right ovarian cyst 09/12/2018   Shortness of breath dyspnea    Spider veins 10/11/2020   Surgery follow-up examination 12/10/2019   Syncope    summer 2022   Thickened endometrium 09/12/2018   Tinnitus    Unknown if patient has history of gastrointestinal disorders 08/12/2015   Note: hemorrhoids, polyps   Urinary urgency    UTI (urinary tract infection)    summer 2022   Vaginal polyp    Varicose veins of both lower extremities 10/11/2020   Vitamin D deficiency    Vitamin D deficiency    Past Surgical History:  Procedure Laterality Date   BREAST SURGERY     left breast    ECTOPIC PREGNANCY SURGERY     ENDOMETRIAL BIOPSY     07/23/16 neg bx   fallopian tube removal     HYSTEROSCOPY WITH D & C N/A 07/23/2016   Procedure: DILATATION AND CURETTAGE;  Surgeon: Christeen Douglas, MD;  Location: [facility] ORS;  Service: Gynecology;  Laterality: N/A;   right foot surgery      TONSILLECTOMY     TUBAL LIGATION     Family History  Problem Relation Age of Onset   Heart disease Mother    Hypertension Mother    Arthritis Mother    Heart disease  Father    Hypertension Father    Social History   Socioeconomic History   Marital status: Widowed    Spouse name: Not on file   Number of children: Not on file   Years of education: Not on file   Highest education level: Not on file  Occupational History   Not on file  Tobacco Use   Smoking status: Never   Smokeless tobacco:  Never  Substance and Sexual Activity   Alcohol use: No    Alcohol/week: 0.0 standard drinks of alcohol   Drug use: No   Sexual activity: Not Currently  Other Topics Concern   Not on file  Social History Narrative   Lives with daughter Steward Drone    Has 2 daughters    From [place]   89 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   Social Determinants of Health   Financial Resource Strain: Low Risk  (07/24/2023)   Overall Financial Resource Strain (CARDIA)    Difficulty of Paying Living Expenses: Not hard at all  Food Insecurity: No Food Insecurity (07/24/2023)   Hunger Vital Sign    Worried About Running Out of Food in the Last Year: Never true    Ran Out of Food in the Last Year: Never true  Transportation Needs: No Transportation Needs (07/24/2023)   PRAPARE - Administrator, Civil Service (Medical): No    Lack of Transportation (Non-Medical): No  Physical Activity: Inactive (07/24/2023)   Exercise Vital Sign    Days of Exercise per Week: 0 days    Minutes of Exercise per Session: 0 min  Stress: Stress Concern Present (07/24/2023)   [employer] of Occupational Health - Occupational Stress Questionnaire    Feeling of Stress : To some extent  Social Connections: Socially Isolated (07/24/2023)   Social Connection and Isolation Panel [NHANES]    Frequency of Communication with Friends and Family: More than three times a week    Frequency of Social Gatherings with Friends and Family: More than three times a week    Attends Religious Services: Never    Database administrator or Organizations: No    Attends Banker  Meetings: Never    Marital Status: Widowed    Tobacco Counseling Counseling given: Not Answered   Clinical Intake:  Pre-visit preparation completed: Yes  Pain : 0-10 Pain Score: 4  Pain Type: Chronic pain Pain Location: Neck Pain Descriptors / Indicators: Aching Pain Onset: More than a month ago Pain Frequency: Intermittent     BMI - recorded: 19.85 Nutritional Risks: None Diabetes: No  How often do you need to have someone help you when you read instructions, pamphlets, or other written materials from your doctor or pharmacy?: 1 - Never  Interpreter Needed?: No  Information entered by :: R. Kane Kusek LPN   Activities of Daily Living    07/24/2023    9:03 AM  In your present state of health, do you have any difficulty performing the following activities:  Hearing? 1  Comment ringing in ears  Vision? 0  Comment readers  Difficulty concentrating or making decisions? 1  Walking or climbing stairs? 1  Comment has to be careful  Dressing or bathing? 0  Doing errands, shopping? 1  Comment daughter  Quarry manager and eating ? Y  Using the Toilet? N  In the past six months, have you accidently leaked urine? Y  Comment wears pads  Do you have problems with loss of bowel control? N  Managing your Medications? Y  Managing your Finances? Y  Housekeeping or managing your Housekeeping? Y    Patient Care Team: Dana Allan, MD as PCP - General (Family Medicine)  Indicate any recent Medical Services you may have received from other than [facility] providers in the past year (date may be approximate).     Assessment:   This is a routine wellness examination for Ariam.  Hearing/Vision screen Hearing Screening - Comments:: Ringing in ears Vision Screening - Comments:: readers  Dietary issues and exercise activities discussed:     Goals Addressed             This Visit's Progress    Patient Stated       Start back exercising       Depression Screen     07/24/2023    9:14 AM 10/25/2022    8:04 AM 08/01/2022   10:43 AM 06/13/2022    9:56 AM 06/13/2022    9:53 AM 06/13/2022    9:52 AM 04/03/2022    1:23 PM  PHQ 2/9 Scores  PHQ - 2 Score 4 0  6 6 6 1   PHQ- 9 Score 15   17 17     Exception Documentation   Patient refusal        Fall Risk    07/24/2023    9:07 AM 10/25/2022    8:04 AM 06/13/2022    9:52 AM 04/03/2022    1:23 PM 02/13/2022    9:37 AM  Fall Risk   Falls in the past year? 0 0 0 0 0  Number falls in past yr: 0 0  0 0  Injury with Fall? 0 0  0 0  Risk for fall due to : No Fall Risks;History of fall(s) No Fall Risks  No Fall Risks Impaired balance/gait  Follow up Falls prevention discussed;Falls evaluation completed;Education provided Falls evaluation completed  Falls evaluation completed Falls evaluation completed    MEDICARE RISK AT HOME: Medicare Risk at Home Any stairs in or around the home?: Yes If so, are there any without handrails?: No Home free of loose throw rugs in walkways, pet beds, electrical cords, etc?: No (discussed) Adequate lighting in your home to reduce risk of falls?: Yes Life alert?: No Use of a cane, walker or w/c?: No Grab bars in the bathroom?: Yes Shower chair or bench in shower?: Yes Elevated toilet seat or a handicapped toilet?: Yes  TIMED UP AND GO:  Was the test performed? Yes  Length of time to ambulate 10 feet: 8 sec Gait steady and fast without use of assistive device    Cognitive Function:        07/24/2023    9:27 AM  6CIT Screen  What Year? 0 points  What month? 3 points  What time? 3 points  Count back from 20 2 points  Months in reverse 2 points  Repeat phrase 8 points  Total Score 18 points    Immunizations Immunization History  Administered Date(s) Administered   PFIZER(Purple Top)SARS-COV-2 Vaccination 04/12/2020, 05/03/2020    TDAP status: Due, Education has been provided regarding the importance of this vaccine. Advised may receive this vaccine at local pharmacy  or Health Dept. Aware to provide a copy of the vaccination record if obtained from local pharmacy or Health Dept. Verbalized acceptance and understanding.  Flu Vaccine status: Due, Education has been provided regarding the importance of this vaccine. Advised may receive this vaccine at local pharmacy or Health Dept. Aware to provide a copy of the vaccination record if obtained from local pharmacy or Health Dept. Verbalized acceptance and understanding.  Pneumococcal vaccine status: Due, Education has been provided regarding the importance of this vaccine. Advised may receive this vaccine at local pharmacy or Health Dept. Aware to provide a copy of the vaccination record if obtained from local pharmacy or Health Dept. Verbalized acceptance and understanding.  Covid-19 vaccine status: Information provided  on how to obtain vaccines.   Qualifies for Shingles Vaccine? Yes   Zostavax completed No   Shingrix Completed?: No.    Education has been provided regarding the importance of this vaccine. Patient has been advised to call insurance company to determine out of pocket expense if they have not yet received this vaccine. Advised may also receive vaccine at local pharmacy or Health Dept. Verbalized acceptance and understanding.  Screening Tests Health Maintenance  Topic Date Due   Medicare Annual Wellness (AWV)  Never done   INFLUENZA VACCINE  06/27/2023   Pneumonia Vaccine 36+ Years old (1 of 1 - PCV) 11/05/2023 (Originally 01/18/1990)   HPV VACCINES  Aged Out   DTaP/Tdap/Td  Discontinued   DEXA SCAN  Discontinued   COVID-19 Vaccine  Discontinued   Zoster Vaccines- Shingrix  Discontinued    Health Maintenance  Health Maintenance Due  Topic Date Due   Medicare Annual Wellness (AWV)  Never done   INFLUENZA VACCINE  06/27/2023    Colorectal cancer screening: No longer required.   Mammogram status: No longer required due to age.  Bone Density Status declines  Lung Cancer Screening: (Low  Dose CT Chest recommended if Age 46-80 years, 20 pack-year currently smoking OR have quit w/in 15years.) does not qualify.     Additional Screening:  Hepatitis C Screening: does not qualify; Completed NA age  Vision Screening: Recommended annual ophthalmology exams for early detection of glaucoma and other disorders of the eye. Is the patient up to date with their annual eye exam?  Yes  Who is the provider or what is the name of the office in which the patient attends annual eye exams? [facility] If pt is not established with a provider, would they like to be referred to a provider to establish care? No .   Dental Screening: Recommended annual dental exams for proper oral hygiene   Community Resource Referral / Chronic Care Management: CRR required this visit?  No   CCM required this visit?  No     Plan:     I have personally reviewed and noted the following in the patient's chart:   Medical and social history Use of alcohol, tobacco or illicit drugs  Current medications and supplements including opioid prescriptions. Patient is not currently taking opioid prescriptions. Functional ability and status Nutritional status Physical activity Advanced directives List of other physicians Hospitalizations, surgeries, and ER visits in previous 12 months Vitals Screenings to include cognitive, depression, and falls Referrals and appointments  In addition, I have reviewed and discussed with patient certain preventive protocols, quality metrics, and best practice recommendations. A written personalized care plan for preventive services as well as general preventive health recommendations were provided to patient.     Sydell Axon, LPN   1/61/0960   After Visit Summary: (Pick Up) Due to this being a telephonic visit, with patients personalized plan was offered to patient and patient has requested to Pick up at office.  Nurse Notes: None

## 2023-07-24 NOTE — Patient Instructions (Signed)
Carmen Humphrey , Thank you for taking time to come for your Medicare Wellness Visit. I appreciate your ongoing commitment to your health goals. Please review the following plan we discussed and let me know if I can assist you in the future.   Referrals/Orders/Follow-Ups/Clinician Recommendations: None  This is a list of the screening recommended for you and due dates:  Health Maintenance  Topic Date Due   Flu Shot  06/27/2023   Pneumonia Vaccine (1 of 1 - PCV) 11/05/2023*   Medicare Annual Wellness Visit  07/23/2024   HPV Vaccine  Aged Out   DTaP/Tdap/Td vaccine  Discontinued   DEXA scan (bone density measurement)  Discontinued   COVID-19 Vaccine  Discontinued   Zoster (Shingles) Vaccine  Discontinued  *Topic was postponed. The date shown is not the original due date.    Advanced directives: (In Chart) A copy of your advanced directives are scanned into your chart should your provider ever need it.  Next Medicare Annual Wellness Visit scheduled for next year: Yes 07/29/24 @ 10:30

## 2023-09-12 ENCOUNTER — Encounter: Payer: Self-pay | Admitting: Family Medicine

## 2023-09-12 ENCOUNTER — Ambulatory Visit: Payer: Medicare HMO

## 2023-09-12 ENCOUNTER — Ambulatory Visit (INDEPENDENT_AMBULATORY_CARE_PROVIDER_SITE_OTHER): Payer: Medicare HMO | Admitting: Family Medicine

## 2023-09-12 VITALS — BP 130/60 | HR 68 | Temp 97.7°F | Resp 16 | Ht 66.0 in | Wt 124.5 lb

## 2023-09-12 DIAGNOSIS — M65352 Trigger finger, left little finger: Secondary | ICD-10-CM

## 2023-09-12 DIAGNOSIS — M542 Cervicalgia: Secondary | ICD-10-CM

## 2023-09-12 DIAGNOSIS — M6530 Trigger finger, unspecified finger: Secondary | ICD-10-CM | POA: Insufficient documentation

## 2023-09-12 DIAGNOSIS — H9313 Tinnitus, bilateral: Secondary | ICD-10-CM

## 2023-09-12 DIAGNOSIS — R1084 Generalized abdominal pain: Secondary | ICD-10-CM | POA: Diagnosis not present

## 2023-09-12 LAB — COMPREHENSIVE METABOLIC PANEL
ALT: 13 U/L (ref 0–35)
AST: 19 U/L (ref 0–37)
Albumin: 4.1 g/dL (ref 3.5–5.2)
Alkaline Phosphatase: 92 U/L (ref 39–117)
BUN: 14 mg/dL (ref 6–23)
CO2: 27 meq/L (ref 19–32)
Calcium: 9.7 mg/dL (ref 8.4–10.5)
Chloride: 103 meq/L (ref 96–112)
Creatinine, Ser: 0.73 mg/dL (ref 0.40–1.20)
GFR: 69.3 mL/min (ref >60.00)
Glucose, Bld: 93 mg/dL (ref 70–99)
Potassium: 4.3 meq/L (ref 3.5–5.1)
Sodium: 139 meq/L (ref 135–145)
Total Bilirubin: 1.1 mg/dL (ref 0.2–1.2)
Total Protein: 7.1 g/dL (ref 6.0–8.3)

## 2023-09-12 LAB — CBC WITH DIFFERENTIAL/PLATELET
Basophils Absolute: 0.1 10*3/uL (ref 0.0–0.1)
Basophils Relative: 1 % (ref 0.0–3.0)
Eosinophils Absolute: 0 10*3/uL (ref 0.0–0.7)
Eosinophils Relative: 0.7 % (ref 0.0–5.0)
HCT: 40.8 % (ref 36.0–46.0)
Hemoglobin: 13.5 g/dL (ref 12.0–15.0)
Lymphocytes Relative: 26.5 % (ref 12.0–46.0)
Lymphs Abs: 1.9 10*3/uL (ref 0.7–4.0)
MCHC: 33.2 g/dL (ref 30.0–36.0)
MCV: 88.9 fL (ref 78.0–100.0)
Monocytes Absolute: 0.6 10*3/uL (ref 0.1–1.0)
Monocytes Relative: 8.6 % (ref 3.0–12.0)
Neutro Abs: 4.5 10*3/uL (ref 1.4–7.7)
Neutrophils Relative %: 63.2 % (ref 43.0–77.0)
Platelets: 293 10*3/uL (ref 150.0–400.0)
RBC: 4.58 Mil/uL (ref 3.87–5.11)
RDW: 13.6 % (ref 11.5–15.5)
WBC: 7.1 10*3/uL (ref 4.0–10.5)

## 2023-09-12 NOTE — Assessment & Plan Note (Signed)
Worsening abdominal pain.  Noted tenderness on examination. Given decline in memory cognition plan for imaging -Order CT abdomen/pelvis -Order lab work to investigate cause of abdominal pain.

## 2023-09-12 NOTE — Assessment & Plan Note (Signed)
Chronic ringing in ears for approximately two years. Discussed options including audiology referral and possible hearing aids. -Consider audiology referral for hearing test.

## 2023-09-12 NOTE — Patient Instructions (Addendum)
It was a pleasure meeting you today. Thank you for allowing me to take part in your health care.  Our goals for today as we discussed include:  We will get some labs today.  If they are abnormal or we need to do something about them, I will call you.  If they are normal, I will send you a message on MyChart (if it is active) or a letter in the mail.  If you don't hear from [place] in 2 weeks, please call the office at the number below.   Xray of neck  CT abdomen.  They will call with appointment.  Recommend PT once reviewed xrays  Orthopedics for trigger finger  Follow up as needed  If you have any questions or concerns, please do not hesitate to call the office at [phone].  I look forward to our next visit and until then take care and stay safe.  Regards,   Dana Allan, MD   [facility]

## 2023-09-12 NOTE — Progress Notes (Signed)
SUBJECTIVE:   Chief Complaint  Patient presents with   Medical Management of Chronic Issues   HPI Presents to clinic with daughter for concerns for generalized pain  Discussed the use of AI scribe software for clinical note transcription with the patient, who gave verbal consent to proceed.  History of Present Illness The patient presents with a chief complaint of persistent neck pain, which has been ongoing for several years. The pain is described as a jerking sensation, which is more pronounced on the left side. The patient also reports a constant ringing in the ears, which has been present for approximately two years.  In addition to these primary complaints, the patient has been experiencing discomfort in the stomach, described as a constant, mild pain. The patient also reports issues with a finger on the right hand, specifically the knuckle, which is causing significant discomfort.  The patient has a history of consultations with orthopedic specialists for issues related to the knees and neck, but has discontinued these visits due to perceived lack of progress. The patient has been given exercises to perform at home, but adherence to this regimen is unclear.  The patient's overall health appears to be declining, with reported weight loss and decreased appetite. The patient also reports difficulty sleeping and memory issues. Despite these challenges, the patient maintains an active lifestyle, including regular walking.  The patient's care is complicated by a reluctance to undergo certain treatments, such as cortisone injections for the finger pain, and potential hearing aids for the tinnitus. The patient's care is further complicated by a fear of being alone, which limits the patient's ability to engage in certain activities or treatments without the presence of a caregiver.    PERTINENT PMH / PSH: As above  OBJECTIVE:  BP 130/60   Pulse 68   Temp 97.7 F (36.5 C)   Resp 16    Ht 5\' 6"  (1.676 m)   Wt 124 lb 8 oz (56.5 kg)   SpO2 97%   BMI 20.09 kg/m    Physical Exam Vitals reviewed.  Constitutional:      General: She is not in acute distress.    Appearance: Normal appearance. She is normal weight. She is not ill-appearing, toxic-appearing or diaphoretic.  Eyes:     General:        Right eye: No discharge.        Left eye: No discharge.     Conjunctiva/sclera: Conjunctivae normal.  Cardiovascular:     Rate and Rhythm: Normal rate and regular rhythm.     Heart sounds: Normal heart sounds.  Pulmonary:     Effort: Pulmonary effort is normal.     Breath sounds: Normal breath sounds.  Abdominal:     General: Abdomen is flat. Bowel sounds are decreased. There is no distension.     Tenderness: There is abdominal tenderness (flinches to palpation) in the right lower quadrant, suprapubic area and left lower quadrant. There is no right CVA tenderness or left CVA tenderness.  Musculoskeletal:        General: Normal range of motion.  Skin:    General: Skin is warm and dry.  Neurological:     General: No focal deficit present.     Mental Status: She is alert and oriented to person, place, and time. Mental status is at baseline.  Psychiatric:        Mood and Affect: Mood normal.        Behavior: Behavior normal.  Thought Content: Thought content normal.        Judgment: Judgment normal.        09/12/2023   11:15 AM 07/24/2023    9:14 AM 10/25/2022    8:04 AM 06/13/2022    9:56 AM 06/13/2022    9:53 AM  Depression screen PHQ 2/9  Decreased Interest 1 1 0 3 3  Down, Depressed, Hopeless 1 3 0 3 3  PHQ - 2 Score 2 4 0 6 6  Altered sleeping 1 3  3 3   Tired, decreased energy 1 1  3 3   Change in appetite 1 3  3 3   Feeling bad or failure about yourself  2 1  1 1   Trouble concentrating 1 3  1 1   Moving slowly or fidgety/restless 0 0  0 0  Suicidal thoughts 0 0  0 0  PHQ-9 Score 8 15  17 17   Difficult doing work/chores Somewhat difficult Not difficult  at all  Not difficult at all Not difficult at all      09/12/2023   11:17 AM 10/07/2020   10:54 AM  GAD 7 : Generalized Anxiety Score  Nervous, Anxious, on Edge 2 0  Control/stop worrying 2 0  Worry too much - different things 2 0  Trouble relaxing 2 0  Restless 1 0  Easily annoyed or irritable 1 0  Afraid - awful might happen 2 0  Total GAD 7 Score 12 0  Anxiety Difficulty Very difficult Not difficult at all    ASSESSMENT/PLAN:  Generalized abdominal pain Assessment & Plan: Worsening abdominal pain.  Noted tenderness on examination. Given decline in memory cognition plan for imaging -Order CT abdomen/pelvis -Order lab work to investigate cause of abdominal pain.  Orders: -     Comprehensive metabolic panel -     CBC with Differential/Platelet -     CT ABDOMEN PELVIS W WO CONTRAST; Future -     POCT URINALYSIS DIP (CLINITEK) -     Urine Culture  Cervical pain (neck) Assessment & Plan: Chronic neck pain with jerking movements. Discussed options including imaging (X-ray or MRI), physical therapy, and possible injections. -Order neck X-ray. -Consider referral to physical therapy based on X-ray results.  Orders: -     DG Cervical Spine Complete; Future  Neck pain Assessment & Plan: Chronic neck pain with jerking movements. Discussed options including imaging (X-ray or MRI), physical therapy, and possible injections. -Order neck X-ray. -Consider referral to physical therapy based on X-ray results.   Trigger little finger of left hand Assessment & Plan: Pain and snapping sensation in finger, worse in one hand. Discussed options including orthopedic referral, cortisone injection, and use of splint. -Advise use of Voltaren cream and splint. -Consider orthopedic referral for possible cortisone injection.   Tinnitus of both ears Assessment & Plan: Chronic ringing in ears for approximately two years. Discussed options including audiology referral and possible hearing  aids. -Consider audiology referral for hearing test.     PDMP reviewed  Return if symptoms worsen or fail to improve, for PCP.  Dana Allan, MD

## 2023-09-12 NOTE — Assessment & Plan Note (Signed)
Chronic neck pain with jerking movements. Discussed options including imaging (X-ray or MRI), physical therapy, and possible injections. -Order neck X-ray. -Consider referral to physical therapy based on X-ray results.

## 2023-09-12 NOTE — Assessment & Plan Note (Signed)
Pain and snapping sensation in finger, worse in one hand. Discussed options including orthopedic referral, cortisone injection, and use of splint. -Advise use of Voltaren cream and splint. -Consider orthopedic referral for possible cortisone injection.

## 2023-09-13 ENCOUNTER — Ambulatory Visit
Admission: RE | Admit: 2023-09-13 | Discharge: 2023-09-13 | Disposition: A | Discharge status: Home / Self Care | Payer: Medicare HMO | Source: Ambulatory Visit | Attending: Family Medicine | Admitting: Family Medicine

## 2023-09-13 ENCOUNTER — Ambulatory Visit: Payer: Medicare HMO

## 2023-09-13 ENCOUNTER — Telehealth: Payer: Self-pay | Admitting: Family Medicine

## 2023-09-13 ENCOUNTER — Other Ambulatory Visit: Payer: Self-pay | Admitting: Family Medicine

## 2023-09-13 DIAGNOSIS — H9313 Tinnitus, bilateral: Secondary | ICD-10-CM

## 2023-09-13 DIAGNOSIS — R1084 Generalized abdominal pain: Secondary | ICD-10-CM | POA: Diagnosis present

## 2023-09-13 DIAGNOSIS — M542 Cervicalgia: Secondary | ICD-10-CM

## 2023-09-13 DIAGNOSIS — M65352 Trigger finger, left little finger: Secondary | ICD-10-CM

## 2023-09-13 MED ORDER — IOHEXOL 300 MG/ML  SOLN
80.0000 mL | Freq: Once | INTRAMUSCULAR | Status: AC | PRN
  Administered 2023-09-13: 80 mL via INTRAVENOUS

## 2023-09-13 NOTE — Telephone Encounter (Signed)
[facility] called CT. They said they need orders in for the patient for her to have a CT Scan. She said they just need a signature and for it to be re faxed to them.

## 2023-09-14 LAB — URINE CULTURE
MICRO NUMBER:: 15613815
SPECIMEN QUALITY:: ADEQUATE

## 2023-09-25 ENCOUNTER — Ambulatory Visit (INDEPENDENT_AMBULATORY_CARE_PROVIDER_SITE_OTHER): Payer: Medicare HMO | Admitting: Family Medicine

## 2023-09-25 ENCOUNTER — Encounter: Payer: Self-pay | Admitting: Family Medicine

## 2023-09-25 VITALS — BP 126/70 | HR 67 | Temp 97.9°F | Resp 16 | Ht 66.0 in | Wt 123.4 lb

## 2023-09-25 DIAGNOSIS — R9389 Abnormal findings on diagnostic imaging of other specified body structures: Secondary | ICD-10-CM | POA: Diagnosis not present

## 2023-09-25 DIAGNOSIS — R1084 Generalized abdominal pain: Secondary | ICD-10-CM | POA: Diagnosis not present

## 2023-09-25 DIAGNOSIS — H9313 Tinnitus, bilateral: Secondary | ICD-10-CM

## 2023-09-25 DIAGNOSIS — M150 Primary generalized (osteo)arthritis: Secondary | ICD-10-CM | POA: Diagnosis not present

## 2023-09-25 NOTE — Patient Instructions (Addendum)
It was a pleasure meeting you today. Thank you for allowing me to take part in your health care.  Our goals for today as we discussed include:  Please let me know if you would like further evaluation for issues discussed today.  This is a list of the screening recommended for you and due dates:  Health Maintenance  Topic Date Due   Pneumonia Vaccine (1 of 1 - PCV) 11/05/2023*   Flu Shot  02/24/2024*   Medicare Annual Wellness Visit  07/23/2024   HPV Vaccine  Aged Out   DTaP/Tdap/Td vaccine  Discontinued   DEXA scan (bone density measurement)  Discontinued   COVID-19 Vaccine  Discontinued   Zoster (Shingles) Vaccine  Discontinued  *Topic was postponed. The date shown is not the original due date.    Follow up as needed.  If you have any questions or concerns, please do not hesitate to call the office at [phone].  I look forward to our next visit and until then take care and stay safe.  Regards,   Dana Allan, MD   [facility]

## 2023-09-25 NOTE — Progress Notes (Signed)
SUBJECTIVE:   Chief Complaint  Patient presents with   Medical Management of Chronic Issues    Discuss CT imaging    HPI Presents to clinic to discuss recent CT abdomen  Discussed the use of AI scribe software for clinical note transcription with the patient, who gave verbal consent to proceed.  History of Present Illness Miss Koltz, a patient with a history of chronic abdominal and back pain, presented with an exacerbation of these symptoms. The patient reported that the pain was particularly severe the previous night. The pain is described as intermittent, with periods of relief lasting up to a month, but is constantly present to some degree, causing discomfort. The patient also reported a sensation of ringing in the ears, which has been persistent and bothersome.  The patient has experienced unintentional weight loss, which was attributed to a decreased appetite and changes in eating habits over the past three years. The patient's caregiver noted that the patient's eating habits have significantly changed, with the patient consuming less food than before.  The patient also reported a history of arthritis, which causes discomfort and audible cracking in the neck. The arthritis symptoms seem to be unrelated to the abdominal pain. The patient has a history of undergoing a pelvic ultrasound and biopsy, but the details and results of these procedures were not discussed in the current consultation.  The patient's memory appears to be impaired, as noted by the caregiver, which may affect the patient's ability to accurately report symptoms and medical history. The patient's caregiver plays a significant role in providing information about the patient's symptoms and medical history.    PERTINENT PMH / PSH: As above  OBJECTIVE:  BP 126/70   Pulse 67   Temp 97.9 F (36.6 C)   Resp 16   Ht 5\' 6"  (1.676 m)   Wt 123 lb 6 oz (56 kg)   SpO2 99%   BMI 19.91 kg/m    Physical Exam Vitals  reviewed.  Constitutional:      General: She is not in acute distress.    Appearance: Normal appearance. She is normal weight. She is not ill-appearing, toxic-appearing or diaphoretic.  Eyes:     General:        Right eye: No discharge.        Left eye: No discharge.     Conjunctiva/sclera: Conjunctivae normal.  Cardiovascular:     Rate and Rhythm: Normal rate and regular rhythm.     Pulses: Normal pulses.     Heart sounds: Normal heart sounds.  Pulmonary:     Effort: Pulmonary effort is normal.     Breath sounds: Normal breath sounds.  Abdominal:     General: Bowel sounds are normal. There is no distension.     Palpations: There is no mass.     Tenderness: There is no abdominal tenderness. There is no guarding or rebound.     Hernia: No hernia is present.  Skin:    General: Skin is warm and dry.  Neurological:     General: No focal deficit present.     Mental Status: She is alert. Mental status is at baseline.  Psychiatric:        Mood and Affect: Mood normal.        Behavior: Behavior is cooperative.        Cognition and Memory: Cognition is impaired. Memory is impaired. She exhibits impaired recent memory.        09/25/2023    9:36  AM 09/12/2023   11:15 AM 07/24/2023    9:14 AM 10/25/2022    8:04 AM 06/13/2022    9:56 AM  Depression screen PHQ 2/9  Decreased Interest 1 1 1  0 3  Down, Depressed, Hopeless 1 1 3  0 3  PHQ - 2 Score 2 2 4  0 6  Altered sleeping 2 1 3  3   Tired, decreased energy 2 1 1  3   Change in appetite 2 1 3  3   Feeling bad or failure about yourself  0 2 1  1   Trouble concentrating 2 1 3  1   Moving slowly or fidgety/restless 0 0 0  0  Suicidal thoughts 0 0 0  0  PHQ-9 Score 10 8 15  17   Difficult doing work/chores Somewhat difficult Somewhat difficult Not difficult at all  Not difficult at all      09/12/2023   11:17 AM 10/07/2020   10:54 AM  GAD 7 : Generalized Anxiety Score  Nervous, Anxious, on Edge 2 0  Control/stop worrying 2 0  Worry  too much - different things 2 0  Trouble relaxing 2 0  Restless 1 0  Easily annoyed or irritable 1 0  Afraid - awful might happen 2 0  Total GAD 7 Score 12 0  Anxiety Difficulty Very difficult Not difficult at all    ASSESSMENT/PLAN:  Generalized abdominal pain Assessment & Plan: Chronic abdominal pain with recent CT showing possible appendiceal distention and wall thickening. No bowel obstruction or inflammation noted. Differential includes inflammation, mucocele, or neoplasm. -Consider referral to GI or surgery for further evaluation. -Consider additional imaging such as MRI. -Patient and daughter to consider and will notify if wanting to proceed with further evaluation, given advanced age and declining mental status.     Thickened endometrium Assessment & Plan: Again noted on CT abdomen -Consider pelvic ultrasound for further evaluation. -Consider OBGYN referral, patient and daughter to discuss and notify if wanting to proceed with evaluation.   Tinnitus of both ears Assessment & Plan: Reports constant ringing in ears. -Consider referral to audiology for hearing test. -Consider MRI of the head and referral to ENT for further evaluation -Declined referral and imaging today.   Primary osteoarthritis involving multiple joints Assessment & Plan: Chronic issue, causing discomfort. -Continue current management strategies including heat, ice, Tylenol, and physical therapy.    PDMP reviewed  Return if symptoms worsen or fail to improve, for PCP.  Dana Allan, MD

## 2023-09-29 ENCOUNTER — Encounter: Payer: Self-pay | Admitting: Family Medicine

## 2023-09-29 NOTE — Assessment & Plan Note (Signed)
Chronic issue, causing discomfort. -Continue current management strategies including heat, ice, Tylenol, and physical therapy.

## 2023-09-29 NOTE — Assessment & Plan Note (Signed)
Again noted on CT abdomen -Consider pelvic ultrasound for further evaluation. -Consider OBGYN referral, patient and daughter to discuss and notify if wanting to proceed with evaluation.

## 2023-09-29 NOTE — Assessment & Plan Note (Signed)
Reports constant ringing in ears. -Consider referral to audiology for hearing test. -Consider MRI of the head and referral to ENT for further evaluation -Declined referral and imaging today.

## 2023-09-29 NOTE — Assessment & Plan Note (Signed)
Chronic abdominal pain with recent CT showing possible appendiceal distention and wall thickening. No bowel obstruction or inflammation noted. Differential includes inflammation, mucocele, or neoplasm. -Consider referral to GI or surgery for further evaluation. -Consider additional imaging such as MRI. -Patient and daughter to consider and will notify if wanting to proceed with further evaluation, given advanced age and declining mental status.

## 2023-10-10 ENCOUNTER — Ambulatory Visit: Payer: Medicare HMO | Admitting: Podiatry

## 2023-10-10 ENCOUNTER — Encounter: Payer: Self-pay | Admitting: Podiatry

## 2023-10-10 VITALS — Ht 66.0 in | Wt 123.4 lb

## 2023-10-10 DIAGNOSIS — D689 Coagulation defect, unspecified: Secondary | ICD-10-CM

## 2023-10-10 DIAGNOSIS — M79675 Pain in left toe(s): Secondary | ICD-10-CM

## 2023-10-10 DIAGNOSIS — L84 Corns and callosities: Secondary | ICD-10-CM | POA: Diagnosis not present

## 2023-10-10 DIAGNOSIS — B351 Tinea unguium: Secondary | ICD-10-CM

## 2023-10-10 DIAGNOSIS — M79674 Pain in right toe(s): Secondary | ICD-10-CM | POA: Diagnosis not present

## 2023-10-10 NOTE — Progress Notes (Signed)
  Subjective:  Patient ID: Carmen Humphrey, female    DOB: [birth year],  MRN: [id number]  89 y.o. female presents to clinic with  painful thick toenails that are difficult to trim. Pain interferes with ambulation. Aggravating factors include wearing enclosed shoe gear. Pain is relieved with periodic professional debridement. Patient accompanied by her daughter on today's visit. Daughter states patient is still wearing Keds sneakers. Chief Complaint  Patient presents with   Nail Problem    Pt is here for RFC, pt is not a diabetic, PCP is Dr Clent Ridges and LOV was a few weeks ago.   New problem(s): None   PCP is Dana Allan, MD.  Allergies  Allergen Reactions   Ciprofloxacin Nausea And Vomiting   Eliquis [Apixaban] Swelling    Tongue swelling and numbness    Fish Allergy Other (See Comments)    Perch Fish   Fish-Derived Products Other (See Comments)    Perch Fish   Shellfish-Derived Products     Review of Systems: Negative except as noted in the HPI.   Objective:  Carmen Humphrey is a pleasant 89 y.o. female WD, WN in NAD.[name]  Vascular Examination: Vascular status intact b/l with palpable pedal pulses. CFT immediate b/l. No edema. No pain with calf compression b/l. Skin temperature gradient WNL b/l. Varicosities present b/l.  Neurological Examination: Sensation grossly intact b/l with 10 gram monofilament. Vibratory sensation intact b/l.   Dermatological Examination: Pedal skin with normal turgor, texture and tone b/l. Toenails 1-5 b/l thick, discolored, elongated with subungual debris and pain on dorsal palpation. Hyperkeratotic lesion(s) medial DIPJ right 2nd digit.  No erythema, no edema, no drainage, no fluctuance.  Musculoskeletal Examination: Muscle strength 5/5 to b/l LE. HAV with bunion deformity noted b/l LE. Hammertoe deformity noted 2-5 b/l. Wearing Keds sneakers.  Radiographs: None  Last A1c:       No data to display         Assessment:   1. Pain due to  onychomycosis of toenails of both feet   2. Corns   3. Coagulation defect [facility])    Plan:  -Patient was evaluated today. All questions/concerns addressed on today's visit. -Patient's family member present. All questions/concerns addressed on today's visit. -Mycotic toenails 1-5 bilaterally were debrided in length and girth with sterile nail nippers and dremel without incident. -Corn(s) R 2nd toe pared utilizing sterile scalpel blade without complication or incident. Total number debrided=1. -Encouraged change in shoe gear to Skechers with stretchable uppers. -Dispensed tube foam. Apply to bilateral great toes every morning. Remove every evening. -Patient/POA to call should there be question/concern in the interim.  Return in about 3 months (around 01/10/2024).  Freddie Breech, DPM

## 2023-12-17 ENCOUNTER — Telehealth: Payer: Self-pay | Admitting: Family Medicine

## 2023-12-17 NOTE — Telephone Encounter (Signed)
Copied from CRM [id number]. Topic: General - Call Back - No Documentation >> Dec 17, 2023  1:37 PM Leavy Cella D wrote: Reason for CRM: Daughter Peggye Pitt  called regarding handicap placard for patient . Caller said she hasn't heard anything from doctor and would like a call back regarding the status . Please call back at [phone]

## 2023-12-18 NOTE — Telephone Encounter (Signed)
Called daughter who is on DPR, and let her know that form is ready for pick up. Form is placed in folder up front.

## 2024-01-09 ENCOUNTER — Ambulatory Visit (INDEPENDENT_AMBULATORY_CARE_PROVIDER_SITE_OTHER): Payer: Medicare HMO | Admitting: Family Medicine

## 2024-01-09 ENCOUNTER — Encounter: Payer: Self-pay | Admitting: Family Medicine

## 2024-01-09 VITALS — BP 114/60 | HR 76 | Temp 97.9°F | Resp 18 | Ht 66.0 in | Wt 125.0 lb

## 2024-01-09 DIAGNOSIS — M25561 Pain in right knee: Secondary | ICD-10-CM | POA: Diagnosis not present

## 2024-01-09 DIAGNOSIS — F0390 Unspecified dementia without behavioral disturbance: Secondary | ICD-10-CM

## 2024-01-09 DIAGNOSIS — M542 Cervicalgia: Secondary | ICD-10-CM | POA: Diagnosis not present

## 2024-01-09 DIAGNOSIS — Q809 Congenital ichthyosis, unspecified: Secondary | ICD-10-CM

## 2024-01-09 DIAGNOSIS — G8929 Other chronic pain: Secondary | ICD-10-CM

## 2024-01-09 NOTE — Patient Instructions (Signed)
It was a pleasure meeting you today. Thank you for allowing me to take part in your health care.  Our goals for today as we discussed include:  Aveeno daily for dry skin If continues to be itchy can use Aveeno with Hydrocortisone  Tylenol 500 mg every 6 hours as needed Lidocaine patch every 12 hours as needed Diclofenac gel every 6 hours as needed Heat/Ice as needed Massage therapy  Let me know if you want refer to physical therapy  [facility] [address] [phone]   This is a list of the screening recommended for you and due dates:  Health Maintenance  Topic Date Due   Pneumonia Vaccine (1 of 2 - PCV) Never done   Flu Shot  02/24/2024*   Medicare Annual Wellness Visit  07/23/2024   HPV Vaccine  Aged Out   DTaP/Tdap/Td vaccine  Discontinued   DEXA scan (bone density measurement)  Discontinued   COVID-19 Vaccine  Discontinued   Zoster (Shingles) Vaccine  Discontinued  *Topic was postponed. The date shown is not the original due date.     If you have any questions or concerns, please do not hesitate to call the office at [phone].  I look forward to our next visit and until then take care and stay safe.  Regards,   Dana Allan, MD   [facility]

## 2024-01-09 NOTE — Progress Notes (Signed)
 SUBJECTIVE:   Chief Complaint  Patient presents with   Neck Pain    For a while   Rash    On top of back X 1 or more week   HPI Presents for acute visit  Discussed the use of AI scribe software for clinical note transcription with the patient, who gave verbal consent to proceed.  History of Present Illness Carmen Humphrey is a 89 year old female who presents with a history of a rash and neck pain.  She has experienced a rash on her back intermittently for approximately two years. The rash is red and itchy, without blisters, and she frequently scratches it. Although the rash has resolved by the time of the visit, she notes a 'black mark' likely due to scratching. No changes in creams, soaps, detergents, or contact with metals have been made that could have caused the rash. She has dry skin, which may contribute to the itchiness.  She experiences neck pain, described as shooting pain, with a history of arthritis in the neck. Previous x-rays have been conducted. She prefers not to take oral medications for pain management and has tried Tylenol without relief. She is hesitant to use baclofen due to concerns about side effects. No numbness or tingling is associated with the neck pain.  She mentions a joint issue, describing it as 'out of place,' and has considered physical therapy options nearby.      PERTINENT PMH / PSH: As above  OBJECTIVE:  BP 114/60   Pulse 76   Temp 97.9 F (36.6 C)   Resp 18   Ht 5\' 6"  (1.676 m)   Wt 125 lb (56.7 kg)   SpO2 98%   BMI 20.18 kg/m    Physical Exam Vitals reviewed.  Constitutional:      General: She is not in acute distress.    Appearance: Normal appearance. She is normal weight. She is not ill-appearing, toxic-appearing or diaphoretic.  Eyes:     General:        Right eye: No discharge.        Left eye: No discharge.     Conjunctiva/sclera: Conjunctivae normal.  Cardiovascular:     Rate and Rhythm: Normal rate and regular rhythm.      Heart sounds: Normal heart sounds.  Pulmonary:     Effort: Pulmonary effort is normal.     Breath sounds: Normal breath sounds.  Musculoskeletal:        General: Normal range of motion.     Cervical back: Normal range of motion. No rigidity.  Lymphadenopathy:     Cervical: No cervical adenopathy.  Skin:    General: Skin is warm and dry.     Findings: No abrasion, bruising, ecchymosis, erythema, laceration, lesion or rash.  Neurological:     Mental Status: She is alert. Mental status is at baseline.  Psychiatric:        Mood and Affect: Mood normal.        Behavior: Behavior normal.        Thought Content: Thought content normal.        Judgment: Judgment normal.           09/25/2023    9:36 AM 09/12/2023   11:15 AM 07/24/2023    9:14 AM 10/25/2022    8:04 AM 06/13/2022    9:56 AM  Depression screen PHQ 2/9  Decreased Interest 1 1 1  0 3  Down, Depressed, Hopeless 1 1 3  0 3  PHQ -  2 Score 2 2 4  0 6  Altered sleeping 2 1 3  3   Tired, decreased energy 2 1 1  3   Change in appetite 2 1 3  3   Feeling bad or failure about yourself  0 2 1  1   Trouble concentrating 2 1 3  1   Moving slowly or fidgety/restless 0 0 0  0  Suicidal thoughts 0 0 0  0  PHQ-9 Score 10 8 15  17   Difficult doing work/chores Somewhat difficult Somewhat difficult Not difficult at all  Not difficult at all      09/12/2023   11:17 AM 10/07/2020   10:54 AM  GAD 7 : Generalized Anxiety Score  Nervous, Anxious, on Edge 2 0  Control/stop worrying 2 0  Worry too much - different things 2 0  Trouble relaxing 2 0  Restless 1 0  Easily annoyed or irritable 1 0  Afraid - awful might happen 2 0  Total GAD 7 Score 12 0  Anxiety Difficulty Very difficult Not difficult at all    ASSESSMENT/PLAN:  Cervical pain (neck) Assessment & Plan: Chronic neck pain likely due to arthritis. No numbness or tingling. Patient is functional and mobile. -Apply diclofenac gel four times a day to the neck area. -Consider  physical therapy at [facility] if insurance allows. Referral will be provided if needed. -Consider using lidocaine patches or capsaicin cream for additional pain relief. -Continues to decline medication.     Chronic pain of right knee Assessment & Plan: Chronic joint pain. No further details provided. -Continue current management plan. -Has seen orthopedics and she declined injections or medications   Dementia without behavioral disturbance, psychotic disturbance, mood disturbance, or anxiety, unspecified dementia severity, unspecified dementia type [facility]) Assessment & Plan: Not currently on any medications.  Continue to redirect and monitor for worsening symptoms. Patient reports struggling with memory. -No specific plan discussed. Continue monitoring.     Xeroderma Assessment & Plan: Resolved rash on the neck. Likely due to dry skin and scratching. No new soaps, creams, or detergents. No contact with nickel or metal. Exam benign -Apply moisturizer regularly, especially after showering. -Consider using Aveeno with hydrocortisone if itching persists.     PDMP reviewed  Return if symptoms worsen or fail to improve, for PCP.  Dana Allan, MD

## 2024-01-13 ENCOUNTER — Encounter: Payer: Self-pay | Admitting: Podiatry

## 2024-01-13 ENCOUNTER — Ambulatory Visit: Payer: Medicare HMO | Admitting: Podiatry

## 2024-01-13 VITALS — Ht 66.0 in | Wt 125.0 lb

## 2024-01-13 DIAGNOSIS — D689 Coagulation defect, unspecified: Secondary | ICD-10-CM | POA: Diagnosis not present

## 2024-01-13 DIAGNOSIS — B351 Tinea unguium: Secondary | ICD-10-CM | POA: Diagnosis not present

## 2024-01-13 DIAGNOSIS — L84 Corns and callosities: Secondary | ICD-10-CM

## 2024-01-13 DIAGNOSIS — M79675 Pain in left toe(s): Secondary | ICD-10-CM | POA: Diagnosis not present

## 2024-01-13 DIAGNOSIS — M79674 Pain in right toe(s): Secondary | ICD-10-CM

## 2024-01-13 NOTE — Progress Notes (Signed)
  Subjective:  Patient ID: Carmen Humphrey, female    DOB: [birth year],  MRN: [id number]  89 y.o. female presents to clinic with  at risk foot care with h/o coagulation defect and corn(s) right foot and painful mycotic toenails that are difficult to trim. Painful toenails interfere with ambulation. Aggravating factors include wearing enclosed shoe gear. Pain is relieved with periodic professional debridement. Painful corns are aggravated when weightbearing when wearing enclosed shoe gear. Pain is relieved with periodic professional debridement.  Chief Complaint  Patient presents with   Nail Problem    Pt is here for [name] PCP is Dr Clent Ridges and LOV was last week.   New problem(s): None   PCP is Dana Allan, MD.  Allergies  Allergen Reactions   Ciprofloxacin Nausea And Vomiting   Eliquis [Apixaban] Swelling    Tongue swelling and numbness    Fish Allergy Other (See Comments)    Perch Fish   Fish-Derived Products Other (See Comments)    Perch Fish   Shellfish-Derived Products     Review of Systems: Negative except as noted in the HPI.   Objective:  Carmen Humphrey is a pleasant 89 y.o. female WD, WN in NAD. AAO x 3.  Vascular Examination: Vascular status intact b/l with palpable pedal pulses. CFT immediate b/l. No edema. No pain with calf compression b/l. Skin temperature gradient WNL b/l. Varicosities present b/l.  Neurological Examination: Sensation grossly intact b/l with 10 gram monofilament. Vibratory sensation intact b/l.   Dermatological Examination: Pedal skin with normal turgor, texture and tone b/l. Toenails 1-5 b/l thick, discolored, elongated with subungual debris and pain on dorsal palpation. Hyperkeratotic lesion(s) R 2nd toe and R 5th toe.  No erythema, no edema, no drainage, no fluctuance.  Musculoskeletal Examination: Muscle strength 5/5 to b/l LE. Hammertoe deformity noted 2-5 b/l. HAV with bunion b/l. Wearing Keds canvas sneakers.  Radiographs: None  Last A1c:        No data to display           Assessment:   1. Pain due to onychomycosis of toenails of both feet   2. Corns   3. Coagulation defect [facility])    Plan:  Patient was evaluated and treated. All patient's and/or POA's questions/concerns addressed on today's visit. Toenails 1-5 debrided in length and girth without incident. Corn(s) R 2nd toe and R 5th toe pared with sharp debridement without incident. Continue soft, supportive shoe gear daily. Report any pedal injuries to medical professional. Encouraged patient to wear Skechers shoes with stretchable uppers which she has already purchased. Dispensed tube foam. Apply to  affected digits  every morning. Remove every evening. Call office if there are any questions/concerns.   Return in about 3 months (around 04/11/2024).  Freddie Breech, DPM      [place] LOCATION: [address], [place] [address]                   Office [phone]   [facility] LOCATION: [address] [place], [place] [address] Office [phone]

## 2024-01-15 ENCOUNTER — Encounter: Payer: Self-pay | Admitting: Family Medicine

## 2024-01-15 DIAGNOSIS — Q809 Congenital ichthyosis, unspecified: Secondary | ICD-10-CM | POA: Insufficient documentation

## 2024-01-15 NOTE — Assessment & Plan Note (Addendum)
 Not currently on any medications.  Continue to redirect and monitor for worsening symptoms. Patient reports struggling with memory. -No specific plan discussed. Continue monitoring.

## 2024-01-15 NOTE — Assessment & Plan Note (Signed)
 Chronic joint pain. No further details provided. -Continue current management plan. -Has seen orthopedics and she declined injections or medications

## 2024-01-15 NOTE — Assessment & Plan Note (Signed)
 Chronic neck pain likely due to arthritis. No numbness or tingling. Patient is functional and mobile. -Apply diclofenac gel four times a day to the neck area. -Consider physical therapy at [facility] if insurance allows. Referral will be provided if needed. -Consider using lidocaine patches or capsaicin cream for additional pain relief. -Continues to decline medication.

## 2024-01-15 NOTE — Assessment & Plan Note (Signed)
 Resolved rash on the neck. Likely due to dry skin and scratching. No new soaps, creams, or detergents. No contact with nickel or metal. Exam benign -Apply moisturizer regularly, especially after showering. -Consider using Aveeno with hydrocortisone if itching persists.

## 2024-04-10 ENCOUNTER — Ambulatory Visit: Payer: Medicare HMO | Admitting: Podiatry

## 2024-04-10 ENCOUNTER — Encounter: Payer: Self-pay | Admitting: Podiatry

## 2024-04-10 VITALS — Ht 66.0 in | Wt 125.0 lb

## 2024-04-10 DIAGNOSIS — L84 Corns and callosities: Secondary | ICD-10-CM

## 2024-04-10 DIAGNOSIS — B351 Tinea unguium: Secondary | ICD-10-CM

## 2024-04-10 DIAGNOSIS — D689 Coagulation defect, unspecified: Secondary | ICD-10-CM

## 2024-04-10 DIAGNOSIS — M79675 Pain in left toe(s): Secondary | ICD-10-CM

## 2024-04-10 DIAGNOSIS — M79674 Pain in right toe(s): Secondary | ICD-10-CM

## 2024-04-10 NOTE — Progress Notes (Signed)
  Subjective:  Patient ID: Carmen Humphrey, female    DOB: [birth year],  MRN: [id number]  89 y.o. female presents at risk foot care with h/o coagulation defect and corn(s) right foot and painful thick toenails that are difficult to trim. Painful toenails interfere with ambulation. Aggravating factors include wearing enclosed shoe gear. Pain is relieved with periodic professional debridement. Painful corns are aggravated when weightbearing when wearing enclosed shoe gear. Pain is relieved with periodic professional debridement. She is accompanied by her daughter on today's visit. Chief Complaint  Patient presents with   Nail Problem    Pt is here for [facility] PCP is Dr Sueanne Emerald and LOV was 2 months ago.   New problem(s): None   PCP is Valli Gaw, MD.  Allergies  Allergen Reactions   Ciprofloxacin Nausea And Vomiting   Eliquis [Apixaban] Swelling    Tongue swelling and numbness    Fish Allergy Other (See Comments)    Perch Fish   Fish-Derived Products Other (See Comments)    Perch Fish   Shellfish-Derived Products     Review of Systems: Negative except as noted in the HPI.   Objective:  Carmen Humphrey is a pleasant 89 y.o. female WD, WN in NAD. AAO x 3.  Vascular Examination: Vascular status intact b/l with palpable pedal pulses. CFT immediate b/l. Pedal hair present. No edema. No pain with calf compression b/l. Skin temperature gradient WNL b/l. No varicosities noted. No cyanosis or clubbing noted.  Neurological Examination: Sensation grossly intact b/l with 10 gram monofilament. Vibratory sensation intact b/l.  Dermatological Examination: Pedal skin with normal turgor, texture and tone b/l. Toenails 1-5 b/l thick, discolored, elongated with subungual debris and pain on dorsal palpation. Hyperkeratotic lesion(s) R 2nd toe and R 5th toe.  No erythema, no edema, no drainage, no fluctuance.  Musculoskeletal Examination: Muscle strength 5/5 to b/l LE. Hammertoe deformity noted 2-5 b/l.  HAV with bunion b/l.   Radiographs: None  Last A1c:       No data to display           Assessment:   1. Pain due to onychomycosis of toenails of both feet   2. Corns   3. Coagulation defect [facility])    Plan:  -Patient's family member present. All questions/concerns addressed on today's visit. -Patient to continue soft, supportive shoe gear daily. -Mycotic toenails 1-5 bilaterally were debrided in length and girth with sterile nail nippers and dremel without incident. -Corn(s) right second digit and right fifth digit pared utilizing sterile scalpel blade without complication or incident. Total number debrided=2. She has Skechers with stretchable uppers, but continues to wear canvas type flats. -Patient/POA to call should there be question/concern in the interim.  Return in about 3 months (around 07/11/2024).  Carmen Humphrey, DPM      [facility] LOCATION: [address], [place] [address]                   Office [phone]   [facility] LOCATION: [address] [place], [place] [address] Office [phone]

## 2024-04-13 ENCOUNTER — Encounter: Payer: Self-pay | Admitting: Podiatry

## 2024-04-22 ENCOUNTER — Ambulatory Visit: Payer: Self-pay | Admitting: Family Medicine

## 2024-04-22 NOTE — Telephone Encounter (Signed)
 Chief Complaint: Pain in buttocks and thighs x1 week  Symptoms: "A little off balance" x1 week, 5-7/10 pain level intermittent Pertinent Negatives: Patient denies limping, numbness, difficulty breathing  Disposition: [x] Appointment (In office)  Additional Notes: Spoke with pt's daughter, Carmen Humphrey. Patient scheduled for an appointment with PCP in office. This RN educated pt daughter on home care, new-worsening symptoms, when to call back/seek emergent care. Patient daughter verbalized understanding and agrees to plan.    Copied from CRM [id number]. Topic: Clinical - Red Word Triage >> Apr 22, 2024  3:09 PM Carmen Humphrey wrote: Red Word that prompted transfer to Nurse Triage: Hip/leg pain, off balance, pain level between 5-7 been going on a bit over a week. Reason for Disposition  [1] MODERATE pain (e.g., interferes with normal activities, limping) AND [2] present > 3 days  Answer Assessment - Initial Assessment Questions Chief Complaint: Pain in buttocks and thighs x1 week  Symptoms: "A little off balance" x1 week, 5-7/10 pain level intermittent  Pertinent Negatives: Patient denies limping, numbness, difficulty breathing  Protocols used: Leg Pain-A-AH

## 2024-04-24 ENCOUNTER — Encounter: Payer: Self-pay | Admitting: Family Medicine

## 2024-04-24 ENCOUNTER — Ambulatory Visit (INDEPENDENT_AMBULATORY_CARE_PROVIDER_SITE_OTHER): Admitting: Family Medicine

## 2024-04-24 VITALS — BP 135/70 | HR 71 | Temp 97.9°F | Resp 20 | Ht 66.0 in | Wt 121.4 lb

## 2024-04-24 DIAGNOSIS — M150 Primary generalized (osteo)arthritis: Secondary | ICD-10-CM | POA: Diagnosis not present

## 2024-04-24 DIAGNOSIS — G8929 Other chronic pain: Secondary | ICD-10-CM | POA: Diagnosis not present

## 2024-04-24 DIAGNOSIS — M25561 Pain in right knee: Secondary | ICD-10-CM

## 2024-04-24 NOTE — Patient Instructions (Signed)
 It was a pleasure meeting you today. Thank you for allowing me to take part in your health care.  Our goals for today as we discussed include:  Tylenol 500 mg every 6 hours as needed Lidocaine  patch every 12 hours as needed Diclofenac gel every 6 hours as needed Heat/Ice as needed Massage therapy Can refer to physical therapy if no improvement    If you have any questions or concerns, please do not hesitate to call the office at [phone].  I look forward to our next visit and until then take care and stay safe.  Regards,   Valli Gaw, MD   [facility]

## 2024-04-28 ENCOUNTER — Encounter: Payer: Self-pay | Admitting: Family Medicine

## 2024-04-28 NOTE — Assessment & Plan Note (Signed)
 Chronic joint pain in hips and fingers, likely osteoarthritis. Tylenol recommended for pain due to hypertension. Physical and massage therapy may aid mobility and pain relief. - Use a round cushion with a hole to reduce coccyx pressure while sitting. - Encourage regular Tylenol use for pain management. - Suggest physical therapy for joint mobility and strength. - Consider massage therapy for pain relief. - Continue vitamin D3 supplementation.

## 2024-04-28 NOTE — Assessment & Plan Note (Signed)
 Chronic joint pain.  -Declines any medications due to potential side effects -Has seen orthopedics and she declined injections or medications

## 2024-04-28 NOTE — Progress Notes (Signed)
 SUBJECTIVE:   Chief Complaint  Patient presents with   Pain    In butt and thigh X couple weeks getting worse   HPI Presents for acute visit  Discussed the use of AI scribe software for clinical note transcription with the patient, who gave verbal consent to proceed.  History of Present Illness Carmen Humphrey is a 89 year old female who presents with aches and pains in her thighs, buttocks, and back when sitting. She is accompanied by her daughter.  She experiences aches and pains primarily in her thighs and buttocks, which radiate to her back when sitting for extended periods. Lying down alleviates the discomfort somewhat. She has not used heating pads recently and is not currently taking Tylenol or other pain medications. She recalls a previous recommendation for injections by an orthopedic specialist, which she declined.  She is concerned about her high blood pressure medication and its interaction with ibuprofen , which she has not been taking regularly. She is cautious about taking ibuprofen  due to her high blood pressure medication.  She has a history of gout and is concerned about a possible flare-up. She is not currently taking colchicine, which is a prescription medication for gout flares.  She reports joint issues, particularly in her fingers, with occasional cracking and stiffness. She recalls a time when she could not move her fingers without significant pain, but notes improvement in mobility recently. She is not currently engaged in physical therapy.  She continues to take vitamin D3 and reports eating and cooking adequately for her condition and age.     PERTINENT PMH / PSH: As above  OBJECTIVE:  BP 135/70   Pulse 71   Temp 97.9 F (36.6 C)   Resp 20   Ht 5\' 6"  (1.676 m)   Wt 121 lb 6 oz (55.1 kg)   SpO2 96%   BMI 19.59 kg/m    Physical Exam Vitals reviewed.  Constitutional:      General: She is not in acute distress.    Appearance: Normal appearance.  She is normal weight. She is not ill-appearing, toxic-appearing or diaphoretic.  Eyes:     General:        Right eye: No discharge.        Left eye: No discharge.     Conjunctiva/sclera: Conjunctivae normal.  Cardiovascular:     Rate and Rhythm: Normal rate and regular rhythm.     Heart sounds: Normal heart sounds.  Pulmonary:     Effort: Pulmonary effort is normal.     Breath sounds: Normal breath sounds.  Abdominal:     General: Bowel sounds are normal.  Musculoskeletal:        General: Normal range of motion.  Skin:    General: Skin is warm and dry.  Neurological:     General: No focal deficit present.     Mental Status: She is alert and oriented to person, place, and time. Mental status is at baseline.  Psychiatric:        Mood and Affect: Mood normal.        Behavior: Behavior normal.        Thought Content: Thought content normal.        Judgment: Judgment normal.           09/25/2023    9:36 AM 09/12/2023   11:15 AM 07/24/2023    9:14 AM 10/25/2022    8:04 AM 06/13/2022    9:56 AM  Depression screen PHQ 2/9  Decreased Interest 1 1 1  0 3  Down, Depressed, Hopeless 1 1 3  0 3  PHQ - 2 Score 2 2 4  0 6  Altered sleeping 2 1 3  3   Tired, decreased energy 2 1 1  3   Change in appetite 2 1 3  3   Feeling bad or failure about yourself  0 2 1  1   Trouble concentrating 2 1 3  1   Moving slowly or fidgety/restless 0 0 0  0  Suicidal thoughts 0 0 0  0  PHQ-9 Score 10 8 15  17   Difficult doing work/chores Somewhat difficult Somewhat difficult Not difficult at all  Not difficult at all      09/12/2023   11:17 AM 10/07/2020   10:54 AM  GAD 7 : Generalized Anxiety Score  Nervous, Anxious, on Edge 2 0  Control/stop worrying 2 0  Worry too much - different things 2 0  Trouble relaxing 2 0  Restless 1 0  Easily annoyed or irritable 1 0  Afraid - awful might happen 2 0  Total GAD 7 Score 12 0  Anxiety Difficulty Very difficult Not difficult at all    ASSESSMENT/PLAN:   Primary osteoarthritis involving multiple joints Assessment & Plan: Chronic joint pain in hips and fingers, likely osteoarthritis. Tylenol recommended for pain due to hypertension. Physical and massage therapy may aid mobility and pain relief. - Use a round cushion with a hole to reduce coccyx pressure while sitting. - Encourage regular Tylenol use for pain management. - Suggest physical therapy for joint mobility and strength. - Consider massage therapy for pain relief. - Continue vitamin D3 supplementation.   Chronic pain of right knee Assessment & Plan: Chronic joint pain.  -Declines any medications due to potential side effects -Has seen orthopedics and she declined injections or medications     PDMP reviewed  Return if symptoms worsen or fail to improve, for PCP.  Valli Gaw, MD

## 2024-06-12 ENCOUNTER — Ambulatory Visit: Payer: Self-pay

## 2024-06-12 NOTE — Telephone Encounter (Signed)
 FYI Only or Action Required?: FYI only for provider.  Patient was last seen in primary care on 04/24/2024 by Hope Merle, MD.  Called Nurse Triage reporting Cyst.  Symptoms began pt's daughter unsure d/t pt has dementia.  Interventions attempted: Nothing.  Symptoms are: stable.  Triage Disposition: See PCP When Office is Open (Within 3 Days)  Patient/caregiver understands and will follow disposition?: Yes  Scheduled appt for 06/17/24 d/t first available. Suggested could call [facility] clinic since pt seen provider there. Daughter preferred to wait. Advised of red flags to take pt to ED. She verbalized understanding.   Copied from CRM [id number]. Topic: Clinical - Red Word Triage >> Jun 12, 2024  4:35 PM Harlene ORN wrote: Red Word that prompted transfer to Nurse Triage: She has a knot in the middle of her right leg/calf had it for a few days no pain or burning can feel the knot in her leg no bruising or discoloration Reason for Disposition  [1] Small swelling or lump AND [2] unexplained AND [3] present > 1 week  Answer Assessment - Initial Assessment Questions 1. APPEARANCE of SWELLING: What does it look like?     Can't physically see swelling  2. SIZE: How large is the swelling? (e.g., inches, cm; or compare to size of pinhead, tip of pen, eraser, coin, pea, grape, ping pong ball)      Quarter  3. LOCATION: Where is the swelling located?     R calf  4. ONSET: When did the swelling start?     Unsure, pt has dementia and unsure when started  5. COLOR: What color is it? Is there more than one color?     Skin colored 6. PAIN: Is there any pain? If Yes, ask: How bad is the pain? (Scale 1-10; or mild, moderate, severe)       Pain only when pressure applied  7. ITCH: Does it itch? If Yes, ask: How bad is the itch?      no 9 OTHER SYMPTOMS: Do you have any other symptoms? (e.g., fever)      Denies redness, tender, swelling, bruising  Protocols used: Skin Lump  or Localized Swelling-A-AH

## 2024-06-17 ENCOUNTER — Encounter: Payer: Self-pay | Admitting: Internal Medicine

## 2024-06-17 ENCOUNTER — Ambulatory Visit: Admitting: Internal Medicine

## 2024-06-17 VITALS — BP 114/68 | HR 82 | Temp 97.7°F | Ht 66.0 in | Wt 121.0 lb

## 2024-06-17 DIAGNOSIS — M7989 Other specified soft tissue disorders: Secondary | ICD-10-CM | POA: Diagnosis not present

## 2024-06-17 DIAGNOSIS — R5383 Other fatigue: Secondary | ICD-10-CM | POA: Diagnosis not present

## 2024-06-17 NOTE — Patient Instructions (Signed)
-   It was a pleasure meeting you today -The knot on the cath appears to have resolved.  If this recurs please follow-up with us  again -I will check some blood work on you today since you have been feeling fatigued and cold.  We will check your blood count as well as your thyroid  function -We will contact you with these results -Please call us  if you have any questions or concerns or if you have recurrent symptoms

## 2024-06-17 NOTE — Assessment & Plan Note (Signed)
-   Patient complains of feeling cold all the time even though it is hot out -She also complains of increased fatigue and lightheadedness on standing although this appears to be chronic -Mild conjunctival pallor noted on exam but no other abnormal findings noted -We will check her CBC, CMP and TSH today for further evaluation -No further workup at this time

## 2024-06-17 NOTE — Progress Notes (Signed)
 Acute Office Visit  Subjective:     Patient ID: Carmen Humphrey, female    DOB: June 04, 1925, 89 y.o.   MRN: [id number]  Chief Complaint  Patient presents with   Acute Visit    Knot on right calf    HPI Patient is in today for small swelling in the back of her right calf.  Patient states that approximately 2 to 3 weeks ago she noted a swelling on the back of her right calf approximately the size of a quarter.  She told her daughter about 5 days ago and the daughter felt it as well.  Patient states that it was mildly tender to palpation (1 out of 10).  It was slowly decreasing in size and when she came to the office today she states that it appears to have resolved.  No pain or tenderness today.  Patient denies any fevers or chills.  Daughter states that patient does feel cold all the time.  Patient also complains of fatigue and some mild lightheadedness while standing.  She denies any melena or hematochezia.  No nausea or vomiting.  Review of Systems  Constitutional:  Positive for malaise/fatigue.  HENT: Negative.    Respiratory: Negative.    Cardiovascular: Negative.   Gastrointestinal:  Negative for blood in stool, diarrhea, melena, nausea and vomiting.  Musculoskeletal: Negative.   Neurological:  Negative for loss of consciousness.       Complains of lightheadedness and feeling cold  Psychiatric/Behavioral: Negative.          Objective:    BP 114/68   Pulse 82   Temp 97.7 F (36.5 C)   Ht 5' 6 (1.676 m)   Wt 121 lb (54.9 kg)   SpO2 98%   BMI 19.53 kg/m    Physical Exam Constitutional:      Appearance: Normal appearance.  HENT:     Head: Normocephalic and atraumatic.     Mouth/Throat:     Pharynx: Oropharynx is clear. No oropharyngeal exudate.     Comments: No pallor noted Eyes:     Comments: Mildly pale conjunctiva  Cardiovascular:     Rate and Rhythm: Normal rate and regular rhythm.     Heart sounds: Normal heart sounds.  Pulmonary:     Breath sounds:  Normal breath sounds. No wheezing or rales.  Musculoskeletal:        General: No swelling or tenderness.  Neurological:     Mental Status: She is alert. Mental status is at baseline.  Psychiatric:        Mood and Affect: Mood normal.        Behavior: Behavior normal.     No results found for any visits on 06/17/24.      Assessment & Plan:   Problem List Items Addressed This Visit       Other   Calf swelling   - Patient complains of swelling over the back of her right calf about the size of a quarter for the last 2 to 3 weeks.  She does not remember any trauma to the area. -Her daughter noted the swelling approximately 5 days ago.  It was mildly tender to palpation (1 out of 10) but this pain slowly resolved -Today, patient states that she cannot feel the swelling and appears to have resolved as well -The etiology behind the swelling remains uncertain at this time.  It is most likely a small hematoma secondary to trauma that she does not recall -However, the swelling could  be secondary to muscle spasms but patient states that she has not had any pain except on palpation -Will check CMP -No further workup at this time      Relevant Orders   Comprehensive metabolic panel with GFR   Fatigue - Primary   - Patient complains of feeling cold all the time even though it is hot out -She also complains of increased fatigue and lightheadedness on standing although this appears to be chronic -Mild conjunctival pallor noted on exam but no other abnormal findings noted -We will check her CBC, CMP and TSH today for further evaluation -No further workup at this time      Relevant Orders   CBC with Differential/Platelet   TSH   Comprehensive metabolic panel with GFR    No orders of the defined types were placed in this encounter.   No follow-ups on file.  Roanna Reaves, MD

## 2024-06-17 NOTE — Assessment & Plan Note (Signed)
-   Patient complains of swelling over the back of her right calf about the size of a quarter for the last 2 to 3 weeks.  She does not remember any trauma to the area. -Her daughter noted the swelling approximately 5 days ago.  It was mildly tender to palpation (1 out of 10) but this pain slowly resolved -Today, patient states that she cannot feel the swelling and appears to have resolved as well -The etiology behind the swelling remains uncertain at this time.  It is most likely a small hematoma secondary to trauma that she does not recall -However, the swelling could be secondary to muscle spasms but patient states that she has not had any pain except on palpation -Will check CMP -No further workup at this time

## 2024-06-18 LAB — COMPREHENSIVE METABOLIC PANEL WITH GFR
ALT: 11 U/L (ref 0–35)
AST: 18 U/L (ref 0–37)
Albumin: 3.9 g/dL (ref 3.5–5.2)
Alkaline Phosphatase: 82 U/L (ref 39–117)
BUN: 15 mg/dL (ref 6–23)
CO2: 26 meq/L (ref 19–32)
Calcium: 9.2 mg/dL (ref 8.4–10.5)
Chloride: 105 meq/L (ref 96–112)
Creatinine, Ser: 0.76 mg/dL (ref 0.40–1.20)
GFR: 65.68 mL/min (ref >60.00)
Glucose, Bld: 95 mg/dL (ref 70–99)
Potassium: 4 meq/L (ref 3.5–5.1)
Sodium: 140 meq/L (ref 135–145)
Total Bilirubin: 0.9 mg/dL (ref 0.2–1.2)
Total Protein: 6.2 g/dL (ref 6.0–8.3)

## 2024-06-18 LAB — CBC WITH DIFFERENTIAL/PLATELET
Basophils Absolute: 0 K/uL (ref 0.0–0.1)
Basophils Relative: 0.6 % (ref 0.0–3.0)
Eosinophils Absolute: 0.1 K/uL (ref 0.0–0.7)
Eosinophils Relative: 0.9 % (ref 0.0–5.0)
HCT: 37.2 % (ref 36.0–46.0)
Hemoglobin: 12.6 g/dL (ref 12.0–15.0)
Lymphocytes Relative: 20.3 % (ref 12.0–46.0)
Lymphs Abs: 1.1 K/uL (ref 0.7–4.0)
MCHC: 33.9 g/dL (ref 30.0–36.0)
MCV: 87.8 fl (ref 78.0–100.0)
Monocytes Absolute: 0.3 K/uL (ref 0.1–1.0)
Monocytes Relative: 6.1 % (ref 3.0–12.0)
Neutro Abs: 4.1 K/uL (ref 1.4–7.7)
Neutrophils Relative %: 72.1 % (ref 43.0–77.0)
Platelets: 264 K/uL (ref 150.0–400.0)
RBC: 4.24 Mil/uL (ref 3.87–5.11)
RDW: 13.5 % (ref 11.5–15.5)
WBC: 5.6 K/uL (ref 4.0–10.5)

## 2024-06-18 LAB — TSH: TSH: 3.22 u[IU]/mL (ref 0.35–5.50)

## 2024-06-19 ENCOUNTER — Ambulatory Visit: Payer: Self-pay | Admitting: Internal Medicine

## 2024-07-10 ENCOUNTER — Ambulatory Visit: Admitting: Podiatry

## 2024-07-10 DIAGNOSIS — L84 Corns and callosities: Secondary | ICD-10-CM

## 2024-07-10 DIAGNOSIS — D689 Coagulation defect, unspecified: Secondary | ICD-10-CM | POA: Diagnosis not present

## 2024-07-16 ENCOUNTER — Encounter: Payer: Self-pay | Admitting: Podiatry

## 2024-07-16 NOTE — Progress Notes (Signed)
  Subjective:  Patient ID: Carmen Humphrey, female    DOB: 07-14-1925,  MRN: [id number]  Carmen Humphrey presents to clinic today for at risk foot care with h/o coagulation defect and corn(s) right lower extremity and painful thick toenails that are difficult to trim. Painful toenails interfere with ambulation. Aggravating factors include wearing enclosed shoe gear. Pain is relieved with periodic professional debridement. Painful corns are aggravated when weightbearing when wearing enclosed shoe gear. Pain is relieved with periodic professional debridement. She is accompanied by her daughter on today's visit. Chief Complaint  Patient presents with   Nail Problem    Thick painful toenails, 3 month follow up    New problem(s): None.   PCP is Sherial Bail, MD.  Allergies  Allergen Reactions   Ciprofloxacin Nausea And Vomiting   Eliquis [Apixaban] Swelling    Tongue swelling and numbness    Fish Allergy Other (See Comments)    Perch Fish   Fish-Derived Products Other (See Comments)    Perch Fish   Shellfish-Derived Products     Review of Systems: Negative except as noted in the HPI.  Objective: No changes noted in today's physical examination. There were no vitals filed for this visit. Carmen Humphrey is a pleasant 89 y.o. female WD, WN in NAD. AAO x 3.  Vascular Examination: Vascular status intact b/l with palpable pedal pulses. CFT immediate b/l. Pedal hair present. No edema. No pain with calf compression b/l. Skin temperature gradient WNL b/l. No varicosities noted. No cyanosis or clubbing noted.  Neurological Examination: Sensation grossly intact b/l with 10 gram monofilament. Vibratory sensation intact b/l.  Dermatological Examination: Pedal skin with normal turgor, texture and tone b/l. Toenails 1-5 b/l thick, discolored, elongated with subungual debris and pain on dorsal palpation. Hyperkeratotic lesion(s) medial DIPJ R 2nd toe and dorsal PIPJ R 5th toe.  No erythema,  no edema, no drainage, no fluctuance.  Musculoskeletal Examination: Muscle strength 5/5 to b/l LE. Hammertoe deformity noted 2-5 b/l. HAV with bunion b/l.   Radiographs: None  Assessment/Plan: 1. Corns   2. Coagulation defect [facility])   -Patient was evaluated today. All questions/concerns addressed on today's visit. -Patient to continue soft, supportive shoe gear daily. -Toenails 1-5 b/l were debrided in length and girth with sterile nail nippers and dremel without iatrogenic bleeding.  -Corn(s) DIPJ of R 2nd toe and dorsal PIPJ of R 5th toe pared utilizing sterile scalpel blade without complication or incident. Total number debrided=2. -Patient/POA to call should there be question/concern in the interim.   Return in about 3 months (around 10/10/2024).  Delon LITTIE Merlin, DPM      [facility] LOCATION: [address] [place] [address]                   Office [phone]   [facility] LOCATION: [address] [place], [place] [address] Office [phone]

## 2024-07-29 ENCOUNTER — Ambulatory Visit: Payer: Medicare HMO

## 2024-08-31 ENCOUNTER — Ambulatory Visit: Admitting: Podiatry

## 2024-08-31 DIAGNOSIS — Z91199 Patient's noncompliance with other medical treatment and regimen due to unspecified reason: Secondary | ICD-10-CM

## 2024-09-06 NOTE — Progress Notes (Signed)
 No diagnosis found.

## 2024-10-12 ENCOUNTER — Ambulatory Visit: Admitting: Podiatry

## 2024-10-15 ENCOUNTER — Ambulatory Visit: Admitting: Podiatry

## 2024-10-15 DIAGNOSIS — D689 Coagulation defect, unspecified: Secondary | ICD-10-CM | POA: Diagnosis not present

## 2024-10-15 DIAGNOSIS — B351 Tinea unguium: Secondary | ICD-10-CM

## 2024-10-15 DIAGNOSIS — M79674 Pain in right toe(s): Secondary | ICD-10-CM | POA: Diagnosis not present

## 2024-10-15 DIAGNOSIS — M79675 Pain in left toe(s): Secondary | ICD-10-CM

## 2024-10-15 NOTE — Progress Notes (Signed)
  Subjective:  Patient ID: Carmen Humphrey, female    DOB: 1925/04/14,  MRN: [id number]  Carmen Humphrey presents to clinic today for at risk foot care with h/o coagulation defect and corn(s) right lower extremity and painful thick toenails that are difficult to trim. Painful toenails interfere with ambulation. Aggravating factors include wearing enclosed shoe gear. Pain is relieved with periodic professional debridement. Painful corns are aggravated when weightbearing when wearing enclosed shoe gear. Pain is relieved with periodic professional debridement. She is accompanied by her daughter on today's visit. Chief Complaint  Patient presents with   Toe Pain   New problem(s): None.   PCP is Sherial Bail, MD.  Allergies  Allergen Reactions   Ciprofloxacin Nausea And Vomiting   Eliquis [Apixaban] Swelling    Tongue swelling and numbness    Fish Allergy Other (See Comments)    Perch Fish   Fish Protein-Containing Drug Products Other (See Comments)    Perch Fish   Shellfish Protein-Containing Drug Products     Review of Systems: Negative except as noted in the HPI.  Objective: No changes noted in today's physical examination. There were no vitals filed for this visit. Carmen Humphrey is a pleasant 89 y.o. female WD, WN in NAD. AAO x 3.  Vascular Examination: Vascular status intact b/l with palpable pedal pulses. CFT immediate b/l. Pedal hair present. No edema. No pain with calf compression b/l. Skin temperature gradient WNL b/l. No varicosities noted. No cyanosis or clubbing noted.  Neurological Examination: Sensation grossly intact b/l with 10 gram monofilament. Vibratory sensation intact b/l.  Dermatological Examination: Pedal skin with normal turgor, texture and tone b/l. Toenails 1-5 b/l thick, discolored, elongated with subungual debris and pain on dorsal palpation. Hyperkeratotic lesion(s) medial DIPJ R 2nd toe and dorsal PIPJ R 5th toe.  No erythema, no edema, no  drainage, no fluctuance.  Musculoskeletal Examination: Muscle strength 5/5 to b/l LE. Hammertoe deformity noted 2-5 b/l. HAV with bunion b/l.   Radiographs: None  Assessment/Plan: No diagnosis found. -Patient was evaluated today. All questions/concerns addressed on today's visit. -Patient to continue soft, supportive shoe gear daily. -Toenails 1-5 b/l were debrided in length and girth with sterile nail nippers and dremel without iatrogenic bleeding.  -Corn(s) DIPJ of R 2nd toe and dorsal PIPJ of R 5th toe pared utilizing sterile scalpel blade without complication or incident. Total number debrided=2. -Patient/POA to call should there be question/concern in the interim.   Return in about 3 months (around 01/15/2025).  Franky SHAUNNA Blanch, DPM      [facility] LOCATION: [address] [place] [address]                   Office [phone]   [facility] LOCATION: [address] [place], [place] [address] Office [phone]

## 2024-10-16 ENCOUNTER — Ambulatory Visit: Admitting: Podiatry

## 2024-12-02 ENCOUNTER — Telehealth: Payer: Self-pay

## 2024-12-02 NOTE — Telephone Encounter (Signed)
 Patient daughter has called back and I scheduled an appointment for them on Monday.

## 2024-12-02 NOTE — Telephone Encounter (Signed)
 Copied from CRM [id number]. Topic: Clinical - Medical Advice >> Dec 02, 2024  8:55 AM Wyona SQUIBB wrote: Reason for CRM: Pt daughter Erminio called in to see about her mom getting health with [facility] and would like to know the name of company they worked with before   [phone]

## 2024-12-02 NOTE — Telephone Encounter (Signed)
 Left message for patient daughter to inform her patient would need an appointment to discuss with provider, as she has not established care with Dr Abbey and doesn't until February. Patient needs to be scheduled for an Acute visit with any provider with open availability in office to discuss.  OK for [id number] to get give note and get patient scheduled for an appointment to discuss. If relayed and scheduled, please notify the office.

## 2024-12-03 ENCOUNTER — Telehealth: Payer: Self-pay

## 2024-12-03 NOTE — Telephone Encounter (Signed)
 Called Patient to see if she is a [facility] Dr. Sherial Patient or our Patient. Patient's daughter stated that when DR. Hope left her and her mother was told that our office did not have another provider that could see them until 3/26 and that if they needed to be seen they would have to go to [facility] or the ED. Daughter stated that is when they found Dr. Sherial and started seeing her so I explained that a person can only have one PCP. Daughter stated that her mother wants to be a DR. Bair patient and that they would cancel future appointments with [facility].

## 2024-12-04 ENCOUNTER — Ambulatory Visit: Admitting: Podiatry

## 2024-12-04 ENCOUNTER — Ambulatory Visit: Admitting: Family Medicine

## 2024-12-07 ENCOUNTER — Ambulatory Visit: Admitting: Internal Medicine

## 2024-12-16 ENCOUNTER — Emergency Department: Admission: EM | Admit: 2024-12-16 | Discharge: 2024-12-16 | Disposition: A | Discharge status: Home / Self Care

## 2024-12-16 ENCOUNTER — Other Ambulatory Visit: Payer: Self-pay

## 2024-12-16 DIAGNOSIS — R55 Syncope and collapse: Secondary | ICD-10-CM | POA: Diagnosis present

## 2024-12-16 DIAGNOSIS — E860 Dehydration: Secondary | ICD-10-CM | POA: Diagnosis not present

## 2024-12-16 DIAGNOSIS — F0390 Unspecified dementia without behavioral disturbance: Secondary | ICD-10-CM | POA: Diagnosis not present

## 2024-12-16 LAB — CBG MONITORING, ED: Glucose-Capillary: 110 mg/dL — ABNORMAL HIGH (ref 70–99)

## 2024-12-16 LAB — COMPREHENSIVE METABOLIC PANEL WITH GFR
ALT: 9 U/L (ref 0–44)
AST: 22 U/L (ref 15–41)
Albumin: 4 g/dL (ref 3.5–5.0)
Alkaline Phosphatase: 98 U/L (ref 38–126)
Anion gap: 13 (ref 5–15)
BUN: 14 mg/dL (ref 8–23)
CO2: 19 mmol/L — ABNORMAL LOW (ref 22–32)
Calcium: 10 mg/dL (ref 8.9–10.3)
Chloride: 105 mmol/L (ref 98–111)
Creatinine, Ser: 0.87 mg/dL (ref 0.44–1.00)
GFR, Estimated: >60 mL/min
Glucose, Bld: 125 mg/dL — ABNORMAL HIGH (ref 70–99)
Potassium: 4.6 mmol/L (ref 3.5–5.1)
Sodium: 138 mmol/L (ref 135–145)
Total Bilirubin: 1 mg/dL (ref 0.0–1.2)
Total Protein: 6.9 g/dL (ref 6.5–8.1)

## 2024-12-16 LAB — CBC WITH DIFFERENTIAL/PLATELET
Abs Immature Granulocytes: 0.03 K/uL (ref 0.00–0.07)
Basophils Absolute: 0.1 K/uL (ref 0.0–0.1)
Basophils Relative: 1 %
Eosinophils Absolute: 0 K/uL (ref 0.0–0.5)
Eosinophils Relative: 0 %
HCT: 42 % (ref 36.0–46.0)
Hemoglobin: 13.5 g/dL (ref 12.0–15.0)
Immature Granulocytes: 0 %
Lymphocytes Relative: 26 %
Lymphs Abs: 1.8 K/uL (ref 0.7–4.0)
MCH: 29.5 pg (ref 26.0–34.0)
MCHC: 32.1 g/dL (ref 30.0–36.0)
MCV: 91.9 fL (ref 80.0–100.0)
Monocytes Absolute: 0.4 K/uL (ref 0.1–1.0)
Monocytes Relative: 6 %
Neutro Abs: 4.5 K/uL (ref 1.7–7.7)
Neutrophils Relative %: 67 %
Platelets: 297 K/uL (ref 150–400)
RBC: 4.57 MIL/uL (ref 3.87–5.11)
RDW: 12.7 % (ref 11.5–15.5)
WBC: 6.9 K/uL (ref 4.0–10.5)
nRBC: 0 % (ref 0.0–0.2)

## 2024-12-16 LAB — TROPONIN T, HIGH SENSITIVITY: Troponin T High Sensitivity: 18 ng/L (ref 0–19)

## 2024-12-16 MED ORDER — SODIUM CHLORIDE 0.9 % IV BOLUS
500.0000 mL | Freq: Once | INTRAVENOUS | Status: AC
  Administered 2024-12-16: 500 mL via INTRAVENOUS

## 2024-12-16 NOTE — ED Notes (Signed)
 Daughter requesting to take patient home prior to IVF completion. OK per MD.   Pt refused D/C vitals.

## 2024-12-16 NOTE — Discharge Instructions (Signed)
 Ms. Carmen Humphrey evaluation in the emergency department was overall reassuring.  I am unsure as to the exact cause of her passing out today, but I suspect dehydration may have contributed.  She did receive a small bolus of IV fluid in the emergency department.  Please do follow-up with her primary care provider for reevaluation, and return to the emergency department with any new or worsening symptoms.

## 2024-12-16 NOTE — ED Triage Notes (Signed)
 Pt BIB [facility] from [facility] after syncopal episode. EMS reports CBG low en route, pt refused IV/oral glucose.   CBG 110 on arrival to ED.

## 2024-12-16 NOTE — ED Provider Notes (Addendum)
 "  [facility] Provider Note    Event Date/Time   First MD Initiated Contact with Patient 12/16/24 1410     (approximate)   History   Loss of Consciousness  Pt BIB [facility] from [facility] after syncopal episode. EMS reports CBG low en route, pt refused IV/oral glucose.   CBG 110 on arrival to ED.    HPI Carmen Humphrey is a 89 y.o. female PMH dementia, arthritis with chronic pain, fibromyalgia, hyperlipidemia presents for evaluation after loss of consciousness episode -Per EMS, patient was in line at [facility] when she lost consciousness.  Was caught by bystanders, no associated trauma.  Sounds as if she was unconscious for couple minutes.  She was a glucose reportedly reading low but patient refusing any oral glucose. -On my evaluation, patient is a very limited historian.  Daughter arrived at bedside during evaluation, notes a history of severe dementia and reports patient is at baseline.  Says that she did get lightheaded while standing and while I am and lost consciousness.  Confirms no trauma.  Has otherwise been in her usual state of health. -Says she has passed out before in the setting of being dehydrated and notes she has not been drinking much water lately -No new complaints of pain, no infectious symptoms, no urinary symptoms     Physical Exam   Triage Vital Signs: ED Triage Vitals [12/16/24 1414]  Encounter Vitals Group     BP (!) 151/103     Girls Systolic BP Percentile      Girls Diastolic BP Percentile      Boys Systolic BP Percentile      Boys Diastolic BP Percentile      Pulse Rate 67     Resp 16     Temp 97.7 F (36.5 C)     Temp Source Oral     SpO2 100 %     Weight      Height      Head Circumference      Peak Flow      Pain Score      Pain Loc      Pain Education      Exclude from Growth Chart     Most recent vital signs: Vitals:   12/16/24 1414  BP: (!) 151/103  Pulse: 67  Resp: 16  Temp: 97.7 F (36.5 C)  SpO2: 100%      General: Awake, no distress.  HEENT: Normocephalic, atraumatic, no midline neck pain CV:  Good peripheral perfusion. RRR, RP 2+ Resp:  Normal effort. CTAB Abd:  No distention. Nontender to deep palpation throughout Other:  Alert, conversive, unclear orientation, moving all extremity spontaneously, no focal motor deficit appreciated.   ED Results / Procedures / Treatments   Labs (all labs ordered are listed, but only abnormal results are displayed) Labs Reviewed  COMPREHENSIVE METABOLIC PANEL WITH GFR - Abnormal; Notable for the following components:      Result Value   CO2 19 (*)    Glucose, Bld 125 (*)    All other components within normal limits  CBG MONITORING, ED - Abnormal; Notable for the following components:   Glucose-Capillary 110 (*)    All other components within normal limits  CBC WITH DIFFERENTIAL/PLATELET  URINALYSIS, ROUTINE W REFLEX MICROSCOPIC  TROPONIN T, HIGH SENSITIVITY  TROPONIN T, HIGH SENSITIVITY     EKG  See ED course below.    RADIOLOGY N/a    PROCEDURES:  Critical Care performed: No  Procedures   MEDICATIONS ORDERED IN ED: Medications  sodium chloride  0.9 % bolus 500 mL (500 mLs Intravenous New Bag/Given 12/16/24 1519)     IMPRESSION / MDM / ASSESSMENT AND PLAN / ED COURSE  I reviewed the triage vital signs and the nursing notes.                              DDX/MDM/AP: Differential diagnosis includes, but is not limited to, hypoglycemia, left eye abnormality, arrhythmia, consider but doubt ACS.  Fortunately no evidence of trauma associated with event.  Plan: - POC glucose normal in ED -- low 100s (no meds given prior) - Labs - EKG - Reassess  Patient's presentation is most consistent with acute presentation with potential threat to life or bodily function.  The patient is on the cardiac monitor to evaluate for evidence of arrhythmia and/or significant heart rate changes.  ED course below.  Workup with low bicarb  suggestive of dehydration.  Family preferred not to do more extensive workup including urinalysis, repeat troponin, D-dimer as further investigations would not be close of care and primary goal is to get patient comfortable at home, understands risk of possibly missing underlying pathology and I believe this is a very reasonable plan.  Will discharge home after small bolus IV fluid and plan for PMD follow-up.  ED return precautions in place.  Family agrees with plan.  Clinical Course as of 12/16/24 1546  Wed Dec 16, 2024  1436 Cbc wnl [MM]  1453 Bicarb somewhat low, consider underlying dehydration, will give small bolus IV fluid [MM]  1454 Trop wnl [MM]  1454 Ecg = sinus rhythm, rate 60, no gross ST elevation or depression, no significant repolarization normality, normal axis, normal intervals.  No clear evidence of ischemia nor arrhythmia on my interpretation. [MM]  1520 Discussed reassuring medical workup with family so far.  Does have a history of DVT, discussed possible further investigation for this unlikely etiology of presentation, family prefers to defer as aggressive management would not be within goals of care and patient is very agitated when in the hospital.  Understands we could be missing underlying pathology that could cause clinical worsening but priority is to maintain patient comfortable and at home which I believe is very reasonable.  Does note that patient has passed out once before and this was in the setting of dehydration, notes she has been drinking less than usual recently which is consistent with her low bicarb.  Would also like to defer urinalysis testing.  No recent urinary symptoms, no other falls and this was a syncopal episode.  Will give IV fluid and plan for discharge home. [MM]    Clinical Course User Index [MM] Clarine Ozell LABOR, MD     FINAL CLINICAL IMPRESSION(S) / ED DIAGNOSES   Final diagnoses:  Syncope, unspecified syncope type  Dehydration     Rx / DC  Orders   ED Discharge Orders     None        Note:  This document was prepared using Dragon voice recognition software and may include unintentional dictation errors.   Clarine Ozell LABOR, MD 12/16/24 1546    Clarine Ozell LABOR, MD 12/16/24 1547  "

## 2025-01-07 ENCOUNTER — Encounter

## 2025-01-15 ENCOUNTER — Ambulatory Visit: Admitting: Podiatry
